# Patient Record
Sex: Male | Born: 1965
Health system: Southern US, Community
[De-identification: ages and names within clinical notes are randomized; demographics above are authoritative.]

## PROBLEM LIST (undated history)

## (undated) DIAGNOSIS — E785 Hyperlipidemia, unspecified: Secondary | ICD-10-CM

## (undated) DIAGNOSIS — R0789 Other chest pain: Secondary | ICD-10-CM

## (undated) DIAGNOSIS — I493 Ventricular premature depolarization: Secondary | ICD-10-CM

## (undated) DIAGNOSIS — IMO0001 Reserved for inherently not codable concepts without codable children: Secondary | ICD-10-CM

## (undated) DIAGNOSIS — M109 Gout, unspecified: Secondary | ICD-10-CM

## (undated) DIAGNOSIS — I509 Heart failure, unspecified: Secondary | ICD-10-CM

## (undated) DIAGNOSIS — R7301 Impaired fasting glucose: Secondary | ICD-10-CM

## (undated) DIAGNOSIS — I499 Cardiac arrhythmia, unspecified: Secondary | ICD-10-CM

## (undated) DIAGNOSIS — E669 Obesity, unspecified: Secondary | ICD-10-CM

## (undated) DIAGNOSIS — K219 Gastro-esophageal reflux disease without esophagitis: Secondary | ICD-10-CM

## (undated) DIAGNOSIS — I10 Essential (primary) hypertension: Secondary | ICD-10-CM

## (undated) DIAGNOSIS — Z9989 Dependence on other enabling machines and devices: Secondary | ICD-10-CM

## (undated) DIAGNOSIS — R42 Dizziness and giddiness: Secondary | ICD-10-CM

## (undated) DIAGNOSIS — G4733 Obstructive sleep apnea (adult) (pediatric): Secondary | ICD-10-CM

## (undated) HISTORY — DX: Ventricular premature depolarization: I49.3

## (undated) HISTORY — DX: Heart failure, unspecified: I50.9

## (undated) HISTORY — DX: Other chest pain: R07.89

## (undated) HISTORY — DX: Impaired fasting glucose: R73.01

## (undated) HISTORY — DX: Dizziness and giddiness: R42

## (undated) HISTORY — DX: Obstructive sleep apnea (adult) (pediatric): G47.33

## (undated) HISTORY — DX: Hyperlipidemia, unspecified: E78.5

## (undated) HISTORY — PX: CARPAL TUNNEL RELEASE: SHX101

## (undated) HISTORY — DX: Gastro-esophageal reflux disease without esophagitis: K21.9

## (undated) HISTORY — DX: Cardiac arrhythmia, unspecified: I49.9

## (undated) HISTORY — DX: Obesity, unspecified: E66.9

## (undated) HISTORY — DX: Essential (primary) hypertension: I10

## (undated) HISTORY — DX: Dependence on other enabling machines and devices: Z99.89

## (undated) HISTORY — DX: Reserved for inherently not codable concepts without codable children: IMO0001

## (undated) HISTORY — DX: Gout, unspecified: M10.9

---

## 2004-12-05 ENCOUNTER — Ambulatory Visit: Payer: Self-pay | Admitting: Cardiovascular Disease

## 2004-12-05 ENCOUNTER — Observation Stay (HOSPITAL_COMMUNITY): Admission: EM | Admit: 2004-12-05 | Discharge: 2004-12-07 | Payer: Self-pay | Admitting: Emergency Medicine

## 2004-12-08 ENCOUNTER — Ambulatory Visit: Payer: Self-pay

## 2004-12-25 ENCOUNTER — Ambulatory Visit: Payer: Self-pay | Admitting: Cardiology

## 2013-12-04 ENCOUNTER — Institutional Professional Consult (permissible substitution): Payer: Self-pay | Admitting: Internal Medicine

## 2015-05-27 ENCOUNTER — Ambulatory Visit (INDEPENDENT_AMBULATORY_CARE_PROVIDER_SITE_OTHER): Payer: BLUE CROSS/BLUE SHIELD | Admitting: Neurology

## 2015-05-27 ENCOUNTER — Encounter: Payer: Self-pay | Admitting: Neurology

## 2015-05-27 VITALS — BP 116/62 | HR 48 | Resp 22 | Ht 66.0 in | Wt 252.0 lb

## 2015-05-27 DIAGNOSIS — G4719 Other hypersomnia: Secondary | ICD-10-CM

## 2015-05-27 DIAGNOSIS — R351 Nocturia: Secondary | ICD-10-CM

## 2015-05-27 DIAGNOSIS — R002 Palpitations: Secondary | ICD-10-CM | POA: Diagnosis not present

## 2015-05-27 DIAGNOSIS — G4733 Obstructive sleep apnea (adult) (pediatric): Secondary | ICD-10-CM | POA: Diagnosis not present

## 2015-05-27 DIAGNOSIS — I493 Ventricular premature depolarization: Secondary | ICD-10-CM

## 2015-05-27 DIAGNOSIS — R0789 Other chest pain: Secondary | ICD-10-CM | POA: Diagnosis not present

## 2015-05-27 NOTE — Progress Notes (Signed)
Subjective:    Patient ID: Todd [name] is a 49 y.o. male.  HPI     Huston Foley, MD, PhD [facility] [address] [address] [place], [place] [address]  Dear Todd [name],   I saw your patient, Todd [name], upon your kind request in my neurologic clinic today for initial consultation of his sleep disorder, in particular, reevaluation of his prior diagnosis of OSA. The patient is unaccompanied today. As you know, [name] is a 49 year old right-handed gentleman with an underlying medical history of reflux disease, hyperlipidemia, gout, PVCs, dizziness, impaired fasting glucose, hypertension and obesity, as well as recent chest pain, who was previously diagnosed with obstructive sleep apnea. Prior test results are not available for my review. He reports a diagnosis of severe obstructive sleep apnea. He was using CPAP in the past but it was difficult for him to use it. He stopped using it a few years ago. He has gained weight. He has had some palpitations and chest pain. He had recent further testing in the form of echocardiogram and a nuclear stress test and has an appointment for follow-up this week with you to discuss test results as I understand. He works night shifts on Fridays, Saturdays and Sundays, 12 hours, from 7 PM to 7 AM. On Monday mornings he tries to nap around lunchtime and then go to bed at night. His nighttime bedtime is around 11 or 11:30 PM. He is a loud snorer and has apneic pauses while asleep and wakes up with a sense of gasping for air. He denies morning headaches but has nocturia once or twice on an average night. He denies restless leg symptoms or leg twitching at night. He is not aware of any family history of sleep apnea. He has some stress lately because of his parents health. His mother is 53 and is in end-stage lung cancer. His father is 93 years old and recently fell and broke his hip and had surgery today. His Epworth sleepiness score is 11  out of 24, fatigue score is 25 out of 63. He is a nonsmoker. He drinks caffeine occasionally but does not have to have it daily. He drinks approximately 1 beer a week.  His Past Medical History Is Significant For: Past Medical History  Diagnosis Date  . GERD (gastroesophageal reflux disease)   . HLD (hyperlipidemia)   . OSA on CPAP   . Gout   . PVC (premature ventricular contraction)   . Dizziness   . Atypical chest pain   . IFG (impaired fasting glucose)   . Obesity   . Hypertension     His Past Surgical History Is Significant For: Past Surgical History  Procedure Laterality Date  . Carpal tunnel release      His Family History Is Significant For: Family History  Problem Relation Age of Onset  . Seizures Mother   . Deep vein thrombosis Mother   . COPD Mother   . Heart failure Mother   . Lung cancer Mother   . Prostate cancer Father   . Hypertension Father   . Diabetes Father     His Social History Is Significant For: History   Social History  . Marital Status: Married    Spouse Name: Dorisann Frames  . Number of Children: 1  . Years of Education: HS   Occupational History  . [employer]    Social History Main Topics  . Smoking status: Never Smoker   . Smokeless tobacco: Not on file  .  Alcohol Use: 0.0 oz/week    0 Standard drinks or equivalent per week     Comment: Rare occasion  . Drug Use: No  . Sexual Activity: Not on file   Other Topics Concern  . None   Social History Narrative   Drinks 1 Mt Dew 2 days a week     His Allergies Are:  No Known Allergies:   His Current Medications Are:  Outpatient Encounter Prescriptions as of 05/27/2015  Medication Sig  . amLODipine-olmesartan (AZOR) 5-40 MG per tablet Take 1 tablet by mouth daily.  [name] albuterol (PROVENTIL HFA;VENTOLIN HFA) 108 (90 BASE) MCG/ACT inhaler Inhale into the lungs every 6 (six) hours as needed for wheezing or shortness of breath.  . [DISCONTINUED] pantoprazole (PROTONIX) 40 MG tablet Take 40 mg by  mouth daily.   No facility-administered encounter medications on file as of 05/27/2015.  :  Review of Systems:  Out of a complete 14 point review of systems, all are reviewed and negative with the exception of these symptoms as listed below:   Review of Systems  Neurological:       H/O CPAP use, has not used for about 8 years. Had trouble using machine, and staying on face. Snoring, witnessed apnea, wakes up in night short of breath and panicked. No trouble falling or staying asleep. Wakes up feeling rested. Daytime tiredness.     Objective:  Neurologic Exam  Physical Exam Physical Examination:   Filed Vitals:   05/27/15 1452  BP: 116/62  Pulse: 48  Resp: 22   General Examination: The patient is a very pleasant 49 y.o. male in no acute distress. He appears well-developed and well-nourished and well groomed.   HEENT: Normocephalic, atraumatic, pupils are equal, round and reactive to light and accommodation. Funduscopic exam is normal with sharp disc margins noted. Extraocular tracking is good without limitation to gaze excursion or nystagmus noted. Normal smooth pursuit is noted. Hearing is grossly intact. Tympanic membranes are clear bilaterally. Face is symmetric with normal facial animation and normal facial sensation. Speech is clear with no dysarthria noted. There is no hypophonia. There is no lip, neck/head, jaw or voice tremor. Neck is supple with full range of passive and active motion. There are no carotid bruits on auscultation. Oropharynx exam reveals: mild mouth dryness, adequate dental hygiene and marked airway crowding, due to largue tongue, larger tonsils of 3+ b/l, larger uvula. Mallampati is class III. Tongue protrudes centrally and palate elevates symmetrically. Neck size is 18.75 inches. He has a Absent overbite. Nasal inspection reveals no significant nasal mucosal bogginess or redness and no septal deviation.   Chest: Clear to auscultation without wheezing, rhonchi or  crackles noted.  Heart: S1+S2+0, regular and normal without murmurs, rubs or gallops noted.   Abdomen: Soft, non-tender and non-distended with normal bowel sounds appreciated on auscultation.  Extremities: There is no pitting edema in the distal lower extremities bilaterally. Pedal pulses are intact.  Skin: Warm and dry without trophic changes noted. There are no varicose veins.  Musculoskeletal: exam reveals no obvious joint deformities, tenderness or joint swelling or erythema.   Neurologically:  Mental status: The patient is awake, alert and oriented in all 4 spheres. His immediate and remote memory, attention, language skills and fund of knowledge are appropriate. There is no evidence of aphasia, agnosia, apraxia or anomia. Speech is clear with normal prosody and enunciation. Thought process is linear. Mood is normal and affect is normal.  Cranial nerves II - XII are as  described above under HEENT exam. In addition: shoulder shrug is normal with equal shoulder height noted. Motor exam: Normal bulk, strength and tone is noted. There is no drift, tremor or rebound. Romberg is negative. Reflexes are 2+ throughout. Babinski: Toes are flexor bilaterally. Fine motor skills and coordination: intact with normal finger taps, normal hand movements, normal rapid alternating patting, normal foot taps and normal foot agility.  Cerebellar testing: No dysmetria or intention tremor on finger to nose testing. Heel to shin is unremarkable bilaterally. There is no truncal or gait ataxia.  Sensory exam: intact to light touch, pinprick, vibration, temperature sense in the upper and lower extremities.  Gait, station and balance: He stands easily. No veering to one side is noted. No leaning to one side is noted. Posture is age-appropriate and stance is narrow based. Gait shows normal stride length and normal pace. No problems turning are noted. He turns en bloc. Tandem walk is unremarkable.  Assessment and Plan:   In summary, Kysean Witmer is a very pleasant 49 y.o.-year old male with an underlying medical history of reflux disease, hyperlipidemia, gout, PVCs, dizziness, impaired fasting glucose, hypertension and obesity, as well as recent chest pain, with a prior diagnosis of severe obstructive sleep apnea. He recalls that he quit breathing over 70 times per hour in the past sleep study which was about 10 years ago. He has in the interim gained weight. He reports witnessed apneic pauses, snoring, and excessive daytime somnolence. His history and physical exam concerning are in keeping with significant obstructive sleep apnea (OSA). I had a long chat with the patient about my findings and the diagnosis of OSA, its prognosis and treatment options. We talked about medical treatments, surgical interventions and non-pharmacological approaches. I explained in particular the risks and ramifications of untreated moderate to severe OSA, especially with respect to developing cardiovascular disease down the Road, including congestive heart failure, difficult to treat hypertension, cardiac arrhythmias, or stroke. Even type 2 diabetes has, in part, been linked to untreated OSA. Symptoms of untreated OSA include daytime sleepiness, memory problems, mood irritability and mood disorder such as depression and anxiety, lack of energy, as well as recurrent headaches, especially morning headaches. We talked about trying to maintain a healthy lifestyle in general, as well as the importance of weight control. I encouraged the patient to eat healthy, exercise daily and keep well hydrated, to keep a scheduled bedtime and wake time routine, to not skip any meals and eat healthy snacks in between meals. I advised the patient not to drive when feeling sleepy. I recommended the following at this time: sleep study with potential positive airway pressure titration. (We will score hypopneas at 3% and split the sleep study into diagnostic and  treatment portion, if the estimated. 2 hour AHI is >15/h).   I explained the sleep test procedure to the patient and also outlined possible surgical and non-surgical treatment options of OSA, including the use of a custom-made dental device (which would require a referral to a specialist dentist or oral surgeon), upper airway surgical options, such as pillar implants, radiofrequency surgery, tongue base surgery, and UPPP (which would involve a referral to an ENT surgeon). Rarely, jaw surgery such as mandibular advancement may be considered.  I also explained the CPAP treatment option to the patient, who indicated that he would be willing to try CPAP again, if the need arises. I explained the importance of being compliant with PAP treatment, not only for insurance purposes but primarily to improve  His symptoms, and for the patient's long term health benefit, including to reduce His cardiovascular risks. I answered all his questions today and the patient was in agreement. I would like to see him back after the sleep study is completed and encouraged him to call with any interim questions, concerns, problems or updates.   Thank you very much for allowing me to participate in the care of this nice patient. If I can be of any further assistance to you please do not hesitate to call me at [phone].  Sincerely,   Star Age, MD, PhD

## 2015-05-27 NOTE — Patient Instructions (Signed)

## 2015-06-24 ENCOUNTER — Ambulatory Visit (INDEPENDENT_AMBULATORY_CARE_PROVIDER_SITE_OTHER): Payer: BLUE CROSS/BLUE SHIELD | Admitting: Neurology

## 2015-06-24 DIAGNOSIS — G4733 Obstructive sleep apnea (adult) (pediatric): Secondary | ICD-10-CM | POA: Diagnosis not present

## 2015-06-24 DIAGNOSIS — R9431 Abnormal electrocardiogram [ECG] [EKG]: Secondary | ICD-10-CM

## 2015-06-24 DIAGNOSIS — G479 Sleep disorder, unspecified: Secondary | ICD-10-CM

## 2015-06-24 DIAGNOSIS — G4734 Idiopathic sleep related nonobstructive alveolar hypoventilation: Secondary | ICD-10-CM

## 2015-06-24 NOTE — Sleep Study (Signed)
Please see the scanned sleep study interpretation located in the Procedure tab within the Chart Review section. 

## 2015-06-25 ENCOUNTER — Telehealth: Payer: Self-pay | Admitting: Neurology

## 2015-06-25 DIAGNOSIS — G4734 Idiopathic sleep related nonobstructive alveolar hypoventilation: Secondary | ICD-10-CM

## 2015-06-25 DIAGNOSIS — G4733 Obstructive sleep apnea (adult) (pediatric): Secondary | ICD-10-CM

## 2015-06-25 DIAGNOSIS — R9431 Abnormal electrocardiogram [ECG] [EKG]: Secondary | ICD-10-CM

## 2015-06-25 NOTE — Telephone Encounter (Signed)
Patient seen on 05/27/15, PSG on 06/24/15. Please call and notify the patient that the recent sleep study did confirm the diagnosis of SEVERE obstructive sleep apnea and that I recommend treatment for this in the form of CPAP. This will require a repeat sleep study for proper titration and mask fitting and I would like to do this ASAP. We were not able to start CPAP the same night, because he just did not sleep enough or well enough. The night tech requested that he consider trim his facial hair, as we would have a higher chance of having a good seal with the CPAP mask. Please explain to patient and arrange for a CPAP titration study. I have placed an order in the chart. Please route ph note to [facility], and route PSG report to Dr. [name] and patient's PCP (?). Thanks,  Huston Foley, MD, PhD [facility])

## 2015-06-25 NOTE — Telephone Encounter (Signed)
I spoke to patient, he is aware of results and would like to proceed. I will fax order to Dr. [name] and PCP.

## 2015-06-30 DIAGNOSIS — R079 Chest pain, unspecified: Secondary | ICD-10-CM

## 2015-06-30 NOTE — H&P (Addendum)
OFFICE VISIT NOTES COPIED TO EPIC FOR DOCUMENTATION  Todd [name]. Todd [name] 05/29/2015 8:06 AM Location: [facility] Patient #: [phone] DOB: [birth year] Married / Language: Todd [name] / Race: White Male  History of Present Illness Todd Pert MD; 05/29/2015 6:34 PM) The patient is a 49 year old male who presents with chest pain. Patient is a 49 year old Caucasian male who was referred to Todd [name] for evaluation of atypical chest pain in the form of running sensation in the chest, aching in the chest, which used to be relieved with burping and eating small meals, however has had exertional chest discomfort, history is very vague.  He states that yesterday he had tried PPI with improvement in symptoms. This has not significantly changed his symptoms that started recently were the past month or 2. He has had occasional episodes of dizziness, but denies any shortness of breath, ENT or orthopnea.  On his last office visit, after he had an echocardiogram, which she was found to have severe LV systolic dysfunction, I had started him on carvedilol and discontinued Exforge due to amlodipine combination. Switched him to have Benicar instead. Patient developed severe fatigue, dizziness as well as he started the new regimen and hence discontinued her medications and is back on Exforge. He now presents here to discuss stress testing and echocardiogram findings. No new symptomatology. Again continues to have vague chest discomfort, but still continues to do routine activities without exercise on a regular fashion. His echocardiogram was on 04/29/2015 revealing ejection fraction of 30% without significant valvular maladies and the nuclear stress test on 05/12/2015 revealing inferior wall scar mild peri-infarct ischemia and severe anteroapical ischemia at the apex with a LVEF 19%. No new symptomatology since last office visit.  Problem List/Past Medical (Todd [name]; 05/29/2015 6:33 PM) Chest pain,  atypical (R07.89) Exercise sestamibi stress test 05/12/2015: 1. Resting EKG demonstrated normal sinus rhythm, leftward axis, incomplete right bundle branch block, poor R-wave progression. Stress EKG was negative for myocardial ischemia. Patient exercised for 7 minutes and 27 seconds and achieved 8.7 METS. Stress test was terminated due to shortness of breath, target heart rate (85%) achieved. There are frequent PVCs, ventricular couplets and brief ventricular bigeminy in the recovery phase of the stress EKG. Normal blood pressure response. 2. The left is maarkedly dilated in both in rest and stress images. The perfusion imaging study demonstrates inferior wall, inferolateral nontransmural myocardial infarction with very mild peri-infarct ischemia from base towards the mid ventricle. In addition there is small sized moderate to severe ischemia in the anteroseptal region extending distally to the apex. The left ventricular systolic function Calculated by QGS was 19% with diffuse global hypokinesis, inferior akinesis. Overall this represents high risk study, multivessel disease not be excluded. Consider further cardiac workup. Dizziness (R42) PVC (premature ventricular contraction) (I49.3) Benign essential hypertension (I10) Labs 02/06/2015: Serum glucose 117, creatinine 1.0, ALT 50, CMP otherwise normal, CBC normal, TSH 1.66 Obesity, morbid, BMI 40.0-49.9 (E66.01) Gastroesophageal reflux disease, esophagitis presence not specified (K21.9) Hyperlipidemia, group A (E78.0) Labs 09/02/2014: Creatinine 0.9, CMP normal, CBC normal, total cholesterol 176, triglycerides 105, HDL 41, LDL 114, HbA1c 5.5% Obstructive sleep apnea, adult (G47.33) intolerant of c-pap Office visit 05/27/2015 Todd Furbish, MD): Scheduled for sleep study with potential positive airway pressure titration. Cardiomyopathy (I42.9) Echo- 04/29/2015 1. Left ventricle cavity is moderately dilated. Dilated cardiomyopathy. Moderate concentric  hypertrophy of the left ventricle. Severe decrease in global wall motion. Doppler evidence of grade I (impaired) diastolic dysfunction. Severely decreased systolic function. Calculated EF  30%. 2. Trace aortic regurgitation. 3. Mild mitral regurgitation. 4. Trace tricuspid regurgitation Gout (M10.9) IFG (impaired fasting glucose) (R73.01)  Allergies (Todd Garrison; 05/29/2015 3:15 PM) No Known Drug Allergies04/10/2015  Family History (Todd Garrison; 05/29/2015 3:15 PM) Mother In stable health. hx of seizures, , DVT, Copd; CHF,no heart attack or strokes, no other cardiovascular conditions Father In stable health. hx of prostate cancer, htn, dm; no heart attacks or strokes; no other cardiovascular conditions  Social History (Todd [name]; 05/29/2015 3:15 PM) Current tobacco use Never smoker. Alcohol Use Occasional alcohol use. Marital status Married. Living Situation Lives with spouse. Number of Children 1.  Past Surgical History (Todd [name]; 05/29/2015 3:15 PM) Carpal Tunnel [phone]  Medication History (Todd [name]; 05/29/2015 3:28 PM) Benicar (40MG  Tablet, 1 (one) Tablet Oral daily, Taken starting 04/29/2015) Discontinued: Patient Choice. (Discontinue Azor) Carvedilol (6.25MG  Tablet, 1 (one) Tablet Oral two times daily, Taken starting 04/29/2015) Discontinued: Patient Choice. Pantoprazole Sodium (40MG  Tablet DR, 1 Oral daily) Active. ProAir HFA (108 (90 Base)MCG/ACT Aerosol Soln, 1 puff Inhalation daily as needed) Active. Advil (200MG  Tablet, 1 to 2 Oral as needed for headache) Active. Azor (5-40MG  Tablet, 1 Oral daily, Taken starting 05/25/2015) Active: LV dysfunction. Medications Reconciled  Diagnostic Studies History Todd Pert, MD; 05/29/2015 6:21 PM) Echocardiogram05/31/2016 1. Left ventricle cavity is moderately dilated. Dilated cardiomyopathy. Moderate concentric hypertrophy of the left ventricle. Severe decrease in global wall motion. Doppler  evidence of grade I (impaired) diastolic dysfunction. Severely decreased systolic function. Calculated EF 30%. 2. Trace aortic regurgitation. 3. Mild mitral regurgitation. 4. Trace tricuspid regurgitation Nuclear stress test06/13/2016 1. Resting EKG demonstrated normal sinus rhythm, leftward axis, incomplete right bundle branch block, poor R-wave progression. Stress EKG was negative for myocardial ischemia. Patient exercised for 7 minutes and 27 seconds and achieved 8.7 METS. Stress test was terminated due to shortness of breath, target heart rate (85%) achieved. There are frequent PVCs, ventricular couplets and brief ventricular bigeminy in the recovery phase of the stress EKG. Normal blood pressure response. 2. The left is maarkedly dilated in both in rest and stress images. The perfusion imaging study demonstrates inferior wall, inferolateral nontransmural myocardial infarction with very mild peri-infarct ischemia from base towards the mid ventricle. In addition there is small sized moderate to severe ischemia in the anteroseptal region extending distally to the apex. The left ventricular systolic function Calculated by QGS was 19% with diffuse global hypokinesis, inferior akinesis. Overall this represents high risk study, multivessel disease not be excluded. Consider further cardiac workup. Lower Extremity Dopplers2002 Endoscopy2006 Sleep [id number] Treadmill stress test01/2010 11/2004   Review of Systems Todd Pert, MD; 05/29/2015 6:34 PM) General Not Present- Anorexia, Fatigue and Fever. Respiratory Not Present- Cough, Decreased Exercise Tolerance and Dyspnea. Cardiovascular Present- Chest Pain. Not Present- Claudications, Edema, Orthopnea, Paroxysmal Nocturnal Dyspnea and Shortness of Breath. Gastrointestinal Present- Belching and Indigestion. Not Present- Change in Bowel Habits, Constipation and Nausea. Neurological Present- Dizziness. Not Present- Focal Neurological Symptoms and  Syncope. Endocrine Not Present- Appetite Changes, Cold Intolerance and Heat Intolerance. Hematology Not Present- Anemia, Petechiae and Prolonged Bleeding. Vitals (Todd Garrison; 05/29/2015 3:31 PM) 05/29/2015 3:17 PM Weight: 250 lb Height: 66in Body Surface Area: 2.2 m Body Mass Index: 40.35 kg/m  Pulse: 37 (Regular)  P.OX: 95% (Room air) BP: 128/68 (Sitting, Left Arm, Standard)     Physical Exam Todd Pert, MD; 05/29/2015 6:34 PM) General Mental Status-Alert. General Appearance-Cooperative, Appears stated age, Not in acute distress. Orientation-Oriented X3. Build & Nutrition-Well built and Morbidly obese.  Head and Neck Thyroid Gland Characteristics - no palpable nodules, no palpable enlargement.  Chest and Lung Exam Palpation Tender - No chest wall tenderness. Auscultation Breath sounds - Clear.  Cardiovascular Inspection Jugular vein - Right - No Distention. Auscultation Heart Sounds - S1 WNL, S2 WNL and No gallop present. Murmurs & Other Heart Sounds - Murmur - No murmur.  Abdomen Inspection Contour - Obese. Palpation/Percussion Normal exam - Non Tender and No hepatosplenomegaly. Auscultation Normal exam - Bowel sounds normal.  Peripheral Vascular Lower Extremity Inspection - Left - No Pigmentation, No Varicose veins. Right - No Pigmentation, No Varicose veins. Palpation - Edema - Left - No edema. Right - No edema. Femoral pulse - Left - Normal. Right - Normal. Popliteal pulse - Left - Normal. Right - Normal. Dorsalis pedis pulse - Left - Normal. Right - Normal. Posterior tibial pulse - Left - Normal. Right - Normal. Carotid arteries - Left-No Carotid bruit. Carotid arteries - Right-No Carotid bruit. Abdomen-No prominent abdominal aortic pulsation, No epigastric bruit.  Neurologic Motor-Grossly intact without any focal deficits.  Musculoskeletal Global Assessment Left Lower Extremity - normal range of motion without pain.  Right Lower Extremity - normal range of motion without pain.  Assessment & Plan Todd Pert MD; 05/29/2015 6:21 PM) Chest pain, atypical (R07.89) Story: Exercise sestamibi stress test 05/12/2015: 1. Resting EKG demonstrated normal sinus rhythm, leftward axis, incomplete right bundle branch block, poor R-wave progression. Stress EKG was negative for myocardial ischemia. Patient exercised for 7 minutes and 27 seconds and achieved 8.7 METS. Stress test was terminated due to shortness of breath, target heart rate (85%) achieved. There are frequent PVCs, ventricular couplets and brief ventricular bigeminy in the recovery phase of the stress EKG. Normal blood pressure response. 2. The left ventricle is  markedly dilated in both in rest and stress images. The perfusion imaging study demonstrates inferior wall, inferolateral nontransmural myocardial infarction with very mild peri-infarct ischemia from base towards the mid ventricle. In addition there is small sized moderate to severe ischemia in the anteroseptal region extending distally to the apex. The left ventricular systolic function Calculated by QGS was 19% with diffuse global hypokinesis, inferior akinesis. Overall this represents high study, multivessel disease not be excluded. Consider further cardiac workup. Impression: EKG 05/29/2015: Normal sinus rhythm at rate of 80 bpm, normal axis, incomplete right bundle branch block. Frequent PVCs. Nonspecific T abnormality. Compared to EKG 04/09/2015 PVC new. Current Plans Complete electrocardiogram (93000) Started Nitrostat 0.4MG , 1 (one) Tab Sublingual every 5 minutes as needed for chest pain., #25, 05/29/2015, No Refill. METABOLIC PANEL, BASIC (16109) CBC & PLATELETS (AUTO) (60454) PT (PROTHROMBIN TIME) (09811) Cardiomyopathy (I42.9) Story: Echo- 04/29/2015 1. Left ventricle cavity is moderately dilated. Dilated cardiomyopathy. Moderate concentric hypertrophy of the left ventricle. Severe decrease  in global wall motion. Doppler evidence of grade I (impaired) diastolic dysfunction. Severely decreased systolic function. Calculated EF 30%. 2. Trace aortic regurgitation. 3. Mild mitral regurgitation. 4. Trace tricuspid regurgitation Current Plans Discontinued Azor 5-40MG  (LV dysfunction). Changed Benicar 40MG ,  (one half) Tablet daily, #30, 05/29/2015, Ref. x1. Started Metoprolol Tartrate 25MG ,  Tablet two times daily, #60, 05/29/2015, Ref. x1. Local Order: Discontinue Coreg Started Atorvastatin Calcium 10MG , 1 (one) Tablet daily with food, #30, 05/29/2015, Ref. x1. PVC (premature ventricular contraction) (I49.3) Benign essential hypertension (I10) Story: Labs 02/06/2015: Serum glucose 117, creatinine 1.0, ALT 50, CMP otherwise normal, CBC normal, TSH 1.66  Lipid profile 04/09/2015: Total cholesterol 175, triglycerides 105, HDL 41, LDL 114. BMP was normal,  CMP normal, CBC normal. Obesity, morbid, BMI 40.0-49.9 (E66.01) Obstructive sleep apnea, adult (M01.02) Story: intolerant of c-pap  Office visit 05/27/2015 Todd Furbish, MD): Scheduled for sleep study with potential positive airway pressure titration. Current Plans Mechanism of underlying disease process and action of medications discussed with the patient. I discussed primary/secondary prevention and also dietary counseling was done. Patient was Todd [name] to me for follow-up and evaluation of cardiomyopathy, frequent PVCs, underwent echocardiogram and also stress testing.  He has markedly abnormal stress test, high risk for multivessel disease. Patient could not tolerate high doses of Coreg, he felt markedly fatigued and weak. I discontinued Coreg and started him on metoprolol 25 mg one half tablet by mouth twice a day. Discontinued is or due to LV dysfunction, changed him back to Benicar 40 mg but advised him to start taking half tablet daily. His lipids are very minimally abnormal, however due to high suspicion for CAD, after long discussion, agent  agrees to take atorvastatin 10 mg by mouth daily. S/L NTG was prescribed and explained how to and when to use it and to notify Todd [name] if there is change in frequency of use. Interaction with cialis-like agents (if applicable was discussed). Patient instructed not to do heavy lifting, heavy exertional activity, swimming until evaluation is complete. Patient instructed to call if symptoms worse or to go to the ED for further evaluation.  Schedule for cardiac catheterization, and possible angioplasty. We discussed regarding risks, benefits, alternatives to this including stress testing, CTA and continued medical therapy. Patient wants to proceed. Understands <1-2% risk of death, stroke, MI, urgent CABG, bleeding, infection, renal failure but not limited to these. Video recording of the procedure shown to the patient. Office visit after the tests.  Addendum Note(Bridgette Allison AGNP-C; 06/26/2015 4:54 PM) Labs 06/26/2015: Serum glucose 133, creatinine 1.01, potassium 4.0, BMP normal, CBC normal, PT 10.3, INR 1.0     Signed by Todd Pert, MD (05/29/2015 6:35 PM)

## 2015-07-01 ENCOUNTER — Encounter (HOSPITAL_COMMUNITY)
Admission: RE | Disposition: A | Discharge status: Home / Self Care | Payer: Self-pay | Source: Ambulatory Visit | Attending: Cardiology

## 2015-07-01 ENCOUNTER — Ambulatory Visit (HOSPITAL_COMMUNITY)
Admission: RE | Admit: 2015-07-01 | Discharge: 2015-07-01 | Disposition: A | Discharge status: Home / Self Care | Payer: BLUE CROSS/BLUE SHIELD | Source: Ambulatory Visit | Attending: Cardiology | Admitting: Cardiology

## 2015-07-01 ENCOUNTER — Encounter (HOSPITAL_COMMUNITY): Payer: Self-pay | Admitting: Cardiology

## 2015-07-01 DIAGNOSIS — I10 Essential (primary) hypertension: Secondary | ICD-10-CM | POA: Insufficient documentation

## 2015-07-01 DIAGNOSIS — Z6841 Body Mass Index (BMI) 40.0 and over, adult: Secondary | ICD-10-CM | POA: Insufficient documentation

## 2015-07-01 DIAGNOSIS — I493 Ventricular premature depolarization: Secondary | ICD-10-CM | POA: Insufficient documentation

## 2015-07-01 DIAGNOSIS — R0789 Other chest pain: Secondary | ICD-10-CM | POA: Insufficient documentation

## 2015-07-01 DIAGNOSIS — E785 Hyperlipidemia, unspecified: Secondary | ICD-10-CM | POA: Insufficient documentation

## 2015-07-01 DIAGNOSIS — I420 Dilated cardiomyopathy: Secondary | ICD-10-CM | POA: Diagnosis present

## 2015-07-01 DIAGNOSIS — R079 Chest pain, unspecified: Secondary | ICD-10-CM

## 2015-07-01 DIAGNOSIS — M109 Gout, unspecified: Secondary | ICD-10-CM | POA: Diagnosis not present

## 2015-07-01 DIAGNOSIS — I340 Nonrheumatic mitral (valve) insufficiency: Secondary | ICD-10-CM | POA: Insufficient documentation

## 2015-07-01 DIAGNOSIS — I4510 Unspecified right bundle-branch block: Secondary | ICD-10-CM | POA: Insufficient documentation

## 2015-07-01 DIAGNOSIS — G4733 Obstructive sleep apnea (adult) (pediatric): Secondary | ICD-10-CM | POA: Diagnosis not present

## 2015-07-01 DIAGNOSIS — R7301 Impaired fasting glucose: Secondary | ICD-10-CM | POA: Diagnosis not present

## 2015-07-01 DIAGNOSIS — K219 Gastro-esophageal reflux disease without esophagitis: Secondary | ICD-10-CM | POA: Diagnosis not present

## 2015-07-01 HISTORY — PX: CARDIAC CATHETERIZATION: SHX172

## 2015-07-01 SURGERY — LEFT HEART CATH
Anesthesia: LOCAL

## 2015-07-01 MED ORDER — SODIUM CHLORIDE 0.9 % WEIGHT BASED INFUSION
1.0000 mL/kg/h | INTRAVENOUS | Status: DC
Start: 2015-07-01 — End: 2015-07-01

## 2015-07-01 MED ORDER — SODIUM CHLORIDE 0.9 % IV SOLN
INTRAVENOUS | Status: DC
  Administered 2015-07-01: 11:00:00 via INTRAVENOUS

## 2015-07-01 MED ORDER — SODIUM CHLORIDE 0.9 % IJ SOLN: 3.0000 mL | Freq: Two times a day (BID) | INTRAMUSCULAR | Status: DC

## 2015-07-01 MED ORDER — SODIUM CHLORIDE 0.9 % IJ SOLN: 3.0000 mL | INTRAMUSCULAR | Status: DC | PRN

## 2015-07-01 MED ORDER — MIDAZOLAM HCL 2 MG/2ML IJ SOLN
INTRAMUSCULAR | Status: AC
  Filled 2015-07-01: qty 4

## 2015-07-01 MED ORDER — LIDOCAINE HCL (PF) 1 % IJ SOLN
INTRAMUSCULAR | Status: AC
  Filled 2015-07-01: qty 30

## 2015-07-01 MED ORDER — IOHEXOL 350 MG/ML SOLN
INTRAVENOUS | Status: DC | PRN
  Administered 2015-07-01: 60 mL via INTRA_ARTERIAL

## 2015-07-01 MED ORDER — NITROGLYCERIN 1 MG/10 ML FOR IR/CATH LAB
INTRA_ARTERIAL | Status: AC
  Filled 2015-07-01: qty 10

## 2015-07-01 MED ORDER — SODIUM CHLORIDE 0.9 % IV SOLN: 250.0000 mL | INTRAVENOUS | Status: DC | PRN

## 2015-07-01 MED ORDER — ASPIRIN 81 MG PO CHEW
CHEWABLE_TABLET | ORAL | Status: DC
Start: 2015-07-01 — End: 2015-07-01
  Filled 2015-07-01: qty 1

## 2015-07-01 MED ORDER — HYDROMORPHONE HCL 1 MG/ML IJ SOLN
INTRAMUSCULAR | Status: DC | PRN
  Administered 2015-07-01: 0.5 mg via INTRAVENOUS

## 2015-07-01 MED ORDER — VERAPAMIL HCL 2.5 MG/ML IV SOLN
INTRAVENOUS | Status: AC
  Filled 2015-07-01: qty 2

## 2015-07-01 MED ORDER — ASPIRIN 81 MG PO CHEW
81.0000 mg | CHEWABLE_TABLET | ORAL | Status: AC
  Administered 2015-07-01: 81 mg via ORAL

## 2015-07-01 MED ORDER — RADIAL COCKTAIL (HEPARIN/VERAPAMIL/LIDOCAINE/NITRO)
Status: DC | PRN
  Administered 2015-07-01: 1 via INTRA_ARTERIAL

## 2015-07-01 MED ORDER — HEPARIN (PORCINE) IN NACL 2-0.9 UNIT/ML-% IJ SOLN
INTRAMUSCULAR | Status: AC
  Filled 2015-07-01: qty 1500

## 2015-07-01 MED ORDER — MIDAZOLAM HCL 2 MG/2ML IJ SOLN
INTRAMUSCULAR | Status: DC | PRN
  Administered 2015-07-01: 2 mg via INTRAVENOUS

## 2015-07-01 MED ORDER — HYDROMORPHONE HCL 1 MG/ML IJ SOLN
INTRAMUSCULAR | Status: AC
  Filled 2015-07-01: qty 1

## 2015-07-01 MED ORDER — HEPARIN SODIUM (PORCINE) 1000 UNIT/ML IJ SOLN
INTRAMUSCULAR | Status: DC | PRN
  Administered 2015-07-01: 6000 [IU] via INTRAVENOUS

## 2015-07-01 MED ORDER — SODIUM CHLORIDE 0.9 % WEIGHT BASED INFUSION
3.0000 mL/kg/h | INTRAVENOUS | Status: AC
  Administered 2015-07-01: 3 mL/kg/h via INTRAVENOUS

## 2015-07-01 SURGICAL SUPPLY — 11 items
CATH INFINITI 5FR MPB2 (CATHETERS) IMPLANT
CATH OPTITORQUE TIG 4.0 5F (CATHETERS) ×2 IMPLANT
DEVICE RAD COMP TR BAND LRG (VASCULAR PRODUCTS) ×2 IMPLANT
GLIDESHEATH SLEND A-KIT 6F 20G (SHEATH) ×2 IMPLANT
KIT HEART LEFT (KITS) ×2 IMPLANT
PACK CARDIAC CATHETERIZATION (CUSTOM PROCEDURE TRAY) ×2 IMPLANT
SHEATH PINNACLE 5F 10CM (SHEATH) IMPLANT
TRANSDUCER W/STOPCOCK (MISCELLANEOUS) ×2 IMPLANT
TUBING CIL FLEX 10 FLL-RA (TUBING) ×2 IMPLANT
WIRE EMERALD 3MM-J .035X150CM (WIRE) IMPLANT
WIRE SAFE-T 1.5MM-J .035X260CM (WIRE) ×2 IMPLANT

## 2015-07-01 NOTE — Discharge Instructions (Signed)
Radial Site Care °Refer to this sheet in the next few weeks. These instructions provide you with information on caring for yourself after your procedure. Your caregiver may also give you more specific instructions. Your treatment has been planned according to current medical practices, but problems sometimes occur. Call your caregiver if you have any problems or questions after your procedure. °HOME CARE INSTRUCTIONS °· You may shower the day after the procedure. Remove the bandage (dressing) and gently wash the site with plain soap and water. Gently pat the site dry. °· Do not apply powder or lotion to the site. °· Do not submerge the affected site in water for 3 to 5 days. °· Inspect the site at least twice daily. °· Do not flex or bend the affected arm for 24 hours. °· No lifting over 5 pounds (2.3 kg) for 5 days after your procedure. °· Do not drive home if you are discharged the same day of the procedure. Have someone else drive you. °· You may drive 24 hours after the procedure unless otherwise instructed by your caregiver. °· Do not operate machinery or power tools for 24 hours. °· A responsible adult should be with you for the first 24 hours after you arrive home. °What to expect: °· Any bruising will usually fade within 1 to 2 weeks. °· Blood that collects in the tissue (hematoma) may be painful to the touch. It should usually decrease in size and tenderness within 1 to 2 weeks. °SEEK IMMEDIATE MEDICAL CARE IF: °· You have unusual pain at the radial site. °· You have redness, warmth, swelling, or pain at the radial site. °· You have drainage (other than a small amount of blood on the dressing). °· You have chills. °· You have a fever or persistent symptoms for more than 72 hours. °· You have a fever and your symptoms suddenly get worse. °· Your arm becomes pale, cool, tingly, or numb. °· You have heavy bleeding from the site. Hold pressure on the site. °Document Released: 12/18/2010 Document Revised:  02/07/2012 Document Reviewed: 12/18/2010 °Exit[employer]® Patient Information ©2015 [employer]. This information is not intended to replace advice given to you by your health care provider. Make sure you discuss any questions you have with your health care provider. ° °

## 2015-07-01 NOTE — Interval H&P Note (Signed)
History and Physical Interval Note:  07/01/2015 9:42 AM  Todd [name]  has presented today for surgery, with the diagnosis of c/p  The various methods of treatment have been discussed with the patient and family. After consideration of risks, benefits and other options for treatment, the patient has consented to  Procedure(s): Left Heart Cath (N/A) and possible PCI as a surgical intervention .  The patient's history has been reviewed, patient examined, no change in status, stable for surgery.  I have reviewed the patient's chart and labs.  Questions were answered to the patient's satisfaction.    Ischemic Symptoms? CCS II (Slight limitation of ordinary activity) Anti-ischemic Medical Therapy? Minimal Therapy (1 class of medications) Non-invasive Test Results? High-risk stress test findings: cardiac mortality >3%/yr Prior CABG? No Previous CABG   Patient Information:   1-2V CAD, no prox LAD  A (7)  Indication: 18; Score: 7   Patient Information:   CTO of 1 vessel, no other CAD  U (5)  Indication: 28; Score: 5   Patient Information:   1V CAD with prox LAD  A (8)  Indication: 34; Score: 8   Patient Information:   2V-CAD with prox LAD  A (8)  Indication: 40; Score: 8   Patient Information:   3V-CAD without LMCA  A (8)  Indication: 46; Score: 8   Patient Information:   3V-CAD without LMCA With Abnormal LV systolic function  A (9)  Indication: 48; Score: 9   Patient Information:   LMCA-CAD  A (9)  Indication: 49; Score: 9   Patient Information:   2V-CAD with prox LAD PCI  A (7)  Indication: 62; Score: 7   Patient Information:   2V-CAD with prox LAD CABG  A (8)  Indication: 62; Score: 8   Patient Information:   3V-CAD without LMCA With Low CAD burden(i.e., 3 focal stenoses, low SYNTAX score) PCI  A (7)  Indication: 63; Score: 7   Patient Information:   3V-CAD without LMCA With Low CAD burden(i.e., 3 focal stenoses, low SYNTAX  score) CABG  A (9)  Indication: 63; Score: 9   Patient Information:   3V-CAD without LMCA E06c - Intermediate-high CAD burden (i.e., multiple diffuse lesions, presence of CTO, or high SYNTAX score) PCI  U (4)  Indication: 64; Score: 4   Patient Information:   3V-CAD without LMCA E06c - Intermediate-high CAD burden (i.e., multiple diffuse lesions, presence of CTO, or high SYNTAX score) CABG  A (9)  Indication: 64; Score: 9   Patient Information:   LMCA-CAD With Isolated LMCA stenosis  PCI  U (6)  Indication: 65; Score: 6   Patient Information:   LMCA-CAD With Isolated LMCA stenosis  CABG  A (9)  Indication: 65; Score: 9   Patient Information:   LMCA-CAD Additional CAD, low CAD burden (i.e., 1- to 2-vessel additional involvement, low SYNTAX score) PCI  U (5)  Indication: 66; Score: 5   Patient Information:   LMCA-CAD Additional CAD, low CAD burden (i.e., 1- to 2-vessel additional involvement, low SYNTAX score) CABG  A (9)  Indication: 66; Score: 9   Patient Information:   LMCA-CAD Additional CAD, intermediate-high CAD burden (i.e., 3-vessel involvement, presence of CTO, or high SYNTAX score) PCI  I (3)  Indication: 67; Score: 3   Patient Information:   LMCA-CAD Additional CAD, intermediate-high CAD burden (i.e., 3-vessel involvement, presence of CTO, or high SYNTAX score) CABG  A (9)  Indication: 67; Score: 9  Todd [name]

## 2015-07-02 MED FILL — Heparin Sodium (Porcine) 2 Unit/ML in Sodium Chloride 0.9%: INTRAMUSCULAR | Qty: 500 | Status: AC

## 2015-07-02 MED FILL — Nitroglycerin IV Soln 100 MCG/ML in D5W: INTRA_ARTERIAL | Qty: 10 | Status: AC

## 2015-07-02 MED FILL — Lidocaine HCl Local Preservative Free (PF) Inj 1%: INTRAMUSCULAR | Qty: 30 | Status: AC

## 2015-07-04 ENCOUNTER — Emergency Department (HOSPITAL_COMMUNITY): Payer: BLUE CROSS/BLUE SHIELD

## 2015-07-04 ENCOUNTER — Emergency Department (HOSPITAL_COMMUNITY)
Admission: EM | Admit: 2015-07-04 | Discharge: 2015-07-05 | Disposition: A | Discharge status: Home / Self Care | Payer: BLUE CROSS/BLUE SHIELD | Attending: Emergency Medicine | Admitting: Emergency Medicine

## 2015-07-04 ENCOUNTER — Encounter (HOSPITAL_COMMUNITY): Payer: Self-pay | Admitting: Emergency Medicine

## 2015-07-04 DIAGNOSIS — I10 Essential (primary) hypertension: Secondary | ICD-10-CM | POA: Diagnosis not present

## 2015-07-04 DIAGNOSIS — E785 Hyperlipidemia, unspecified: Secondary | ICD-10-CM | POA: Insufficient documentation

## 2015-07-04 DIAGNOSIS — Z9889 Other specified postprocedural states: Secondary | ICD-10-CM | POA: Insufficient documentation

## 2015-07-04 DIAGNOSIS — R002 Palpitations: Secondary | ICD-10-CM | POA: Diagnosis not present

## 2015-07-04 DIAGNOSIS — R079 Chest pain, unspecified: Secondary | ICD-10-CM | POA: Insufficient documentation

## 2015-07-04 DIAGNOSIS — Z79899 Other long term (current) drug therapy: Secondary | ICD-10-CM | POA: Insufficient documentation

## 2015-07-04 DIAGNOSIS — Z8739 Personal history of other diseases of the musculoskeletal system and connective tissue: Secondary | ICD-10-CM | POA: Insufficient documentation

## 2015-07-04 DIAGNOSIS — Z8719 Personal history of other diseases of the digestive system: Secondary | ICD-10-CM | POA: Diagnosis not present

## 2015-07-04 DIAGNOSIS — G4733 Obstructive sleep apnea (adult) (pediatric): Secondary | ICD-10-CM | POA: Diagnosis not present

## 2015-07-04 DIAGNOSIS — E669 Obesity, unspecified: Secondary | ICD-10-CM | POA: Insufficient documentation

## 2015-07-04 LAB — CBC
HEMATOCRIT: 43.9 % (ref 39.0–52.0)
Hemoglobin: 14.5 g/dL (ref 13.0–17.0)
MCH: 26.9 pg (ref 26.0–34.0)
MCHC: 33 g/dL (ref 30.0–36.0)
MCV: 81.3 fL (ref 78.0–100.0)
Platelets: 204 10*3/uL (ref 150–400)
RBC: 5.4 MIL/uL (ref 4.22–5.81)
RDW: 14.6 % (ref 11.5–15.5)
WBC: 6.9 10*3/uL (ref 4.0–10.5)

## 2015-07-04 LAB — BASIC METABOLIC PANEL
ANION GAP: 12 (ref 5–15)
BUN: 12 mg/dL (ref 6–20)
CALCIUM: 9 mg/dL (ref 8.9–10.3)
CO2: 24 mmol/L (ref 22–32)
Chloride: 103 mmol/L (ref 101–111)
Creatinine, Ser: 1.11 mg/dL (ref 0.61–1.24)
GLUCOSE: 99 mg/dL (ref 65–99)
Potassium: 3.9 mmol/L (ref 3.5–5.1)
Sodium: 139 mmol/L (ref 135–145)

## 2015-07-04 LAB — I-STAT TROPONIN, ED: TROPONIN I, POC: 0.04 ng/mL (ref 0.00–0.08)

## 2015-07-04 NOTE — ED Provider Notes (Signed)
CSN: [id number]     Arrival date & time 07/04/15  2219 History   First MD Initiated Contact with Patient 07/04/15 2303     Chief Complaint  Patient presents with  . Chest Pain     (Consider location/radiation/quality/duration/timing/severity/associated sxs/prior Treatment) HPI Comments: Patient with episode of palpitations and pounding in his chest that onset around 9 PM while he was going to work. This lasted about 10 minutes and have since resolved. He denies any chest pain. He denies any shortness of breath, nausea or vomiting. Patient had cardiac catheterization 3 days ago by Dr. [name] that showed cardiomyopathy without any coronary artery lesions. He states compliance with his medications. He denies any difficulty breathing, swallowing, dizziness. No focal weakness, numbness or tingling. Denies ever having any chest pain just a feeling of palpitations in his chest.   The history is provided by the patient.    Past Medical History  Diagnosis Date  . GERD (gastroesophageal reflux disease)   . HLD (hyperlipidemia)   . OSA on CPAP   . Gout   . PVC (premature ventricular contraction)   . Dizziness   . Atypical chest pain   . IFG (impaired fasting glucose)   . Obesity   . Hypertension    Past Surgical History  Procedure Laterality Date  . Carpal tunnel release    . Cardiac catheterization N/A 07/01/2015    Procedure: Left Heart Cath;  Surgeon: Yates Decamp, MD;  Location: [facility] INVASIVE CV LAB;  Service: Cardiovascular;  Laterality: N/A;   Family History  Problem Relation Age of Onset  . Seizures Mother   . Deep vein thrombosis Mother   . COPD Mother   . Heart failure Mother   . Lung cancer Mother   . Prostate cancer Father   . Hypertension Father   . Diabetes Father    History  Substance Use Topics  . Smoking status: Never Smoker   . Smokeless tobacco: Not on file  . Alcohol Use: 0.0 oz/week    0 Standard drinks or equivalent per week     Comment: Rare occasion    Review  of Systems  Constitutional: Negative for fever, activity change and appetite change.  HENT: Negative for congestion and rhinorrhea.   Eyes: Negative for visual disturbance.  Respiratory: Negative for cough, chest tightness and shortness of breath.   Cardiovascular: Positive for chest pain and palpitations.  Gastrointestinal: Negative for nausea, vomiting and abdominal pain.  Genitourinary: Negative for dysuria, hematuria and testicular pain.  Musculoskeletal: Negative for myalgias and arthralgias.  Skin: Negative for rash.  Neurological: Negative for dizziness, weakness, light-headedness and headaches.  A complete 10 system review of systems was obtained and all systems are negative except as noted in the HPI and PMH.      Allergies  Review of patient's allergies indicates no known allergies.  Home Medications   Prior to Admission medications   Medication Sig Start Date End Date Taking? Authorizing Provider  albuterol (PROVENTIL HFA;VENTOLIN HFA) 108 (90 BASE) MCG/ACT inhaler Inhale 1-2 puffs into the lungs every 6 (six) hours as needed for wheezing or shortness of breath.    Yes Historical Provider, MD  atorvastatin (LIPITOR) 10 MG tablet Take 10 mg by mouth daily.   Yes Historical Provider, MD  carvedilol (COREG) 6.25 MG tablet Take 3.125 mg by mouth 2 (two) times daily with a meal.   Yes Historical Provider, MD  olmesartan (BENICAR) 40 MG tablet Take 40 mg by mouth daily.   Yes  Historical Provider, MD   BP 132/94 mmHg  Pulse 85  Temp(Src) 99.5 F (37.5 C) (Oral)  Resp 17  SpO2 98% Physical Exam  Constitutional: He is oriented to person, place, and time. He appears well-developed and well-nourished. No distress.  HENT:  Head: Normocephalic and atraumatic.  Mouth/Throat: Oropharynx is clear and moist. No oropharyngeal exudate.  Eyes: Conjunctivae and EOM are normal. Pupils are equal, round, and reactive to light.  Neck: Normal range of motion. Neck supple.  No meningismus.   Cardiovascular: Normal rate, regular rhythm, normal heart sounds and intact distal pulses.   No murmur heard. Frequent PVcs  Pulmonary/Chest: Effort normal and breath sounds normal. No respiratory distress. He exhibits no tenderness.  Abdominal: Soft. There is no tenderness. There is no rebound and no guarding.  Musculoskeletal: Normal range of motion. He exhibits no edema or tenderness.  Neurological: He is alert and oriented to person, place, and time. No cranial nerve deficit. He exhibits normal muscle tone. Coordination normal.  No ataxia on finger to nose bilaterally. No pronator drift. 5/5 strength throughout. CN 2-12 intact. Negative Romberg. Equal grip strength. Sensation intact. Gait is normal.   Skin: Skin is warm.  Psychiatric: He has a normal mood and affect. His behavior is normal.  Nursing note and vitals reviewed.   ED Course  Procedures (including critical care time) Labs Review Labs Reviewed  TROPONIN I - Abnormal; Notable for the following:    Troponin I 0.04 (*)    All other components within normal limits  BASIC METABOLIC PANEL  CBC  I-STAT TROPOININ, ED    Imaging Review Dg Chest 2 View  07/04/2015   CLINICAL DATA:  Irregular heartbeat.  EXAM: CHEST  2 VIEW  COMPARISON:  Chest radiograph December 05, 2004  FINDINGS: Cardiomediastinal silhouette is normal. Minimal LEFT lung base scarring. The lungs are clear without pleural effusions or focal consolidations. Trachea projects midline and there is no pneumothorax. Soft tissue planes and included osseous structures are non-suspicious. Mild degenerative change of the thoracic spine.  IMPRESSION: No active cardiopulmonary disease.   Electronically Signed   By: Awilda Metro M.D.   On: 07/04/2015 23:37     EKG Interpretation   Date/Time:  Saturday July 05 2015 00:36:15 EDT Ventricular Rate:  85 PR Interval:  165 QRS Duration: 114 QT Interval:  393 QTC Calculation: 467 R Axis:   -5 Text Interpretation:  Sinus  rhythm Incomplete right bundle branch block  Baseline wander in lead(s) V2 No significant change was found Confirmed by  Manus Gunning  MD, Luca Dyar ([id number]) on 07/05/2015 12:43:47 AM      MDM   Final diagnoses:  Palpitations   Palpitations ongoing for 10 minutes and now resolved. EKG with frequent PVCs. No CP or SOB.[name] "pounding in chest".  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 Marked LVH. Normal coronary arteries.   Palpitations with recent reassuring heart cath.  EF 30% however.  No chest pain or SOB. Labs reassuring.  Troponin minimally elevated. No tachycardia or hypoxia.  Doubt PE, doubt ACS.  Awaiting call back from Dr. [name]. Care transferred to [facility] at shift change.  Glynn Octave, MD 07/05/15 [phone]

## 2015-07-04 NOTE — ED Notes (Addendum)
C/o heart "fluttering" tonight and discomfort to L chest since 9pm.  Reports he has felt a little anxious and also reports mild nausea when starting to eat.  Negative cardiac cath on Tuesday and history of acid reflux.  Denies sob or any other symptoms.

## 2015-07-04 NOTE — ED Notes (Signed)
Patient presents stating earlier tonight he felt like there were "2 squirrels dancing on his chest" and would come and go.  Denies any SOB, N/V, diaphoresis

## 2015-07-05 LAB — TROPONIN I: TROPONIN I: 0.04 ng/mL — AB (ref <0.031)

## 2015-07-05 NOTE — ED Provider Notes (Signed)
  Physical Exam  BP 136/99 mmHg  Pulse 89  Temp(Src) 99.5 F (37.5 C) (Oral)  Resp 15  SpO2 98%  Physical Exam  Constitutional: He appears well-developed and well-nourished. No distress.  HENT:  Head: Atraumatic.  Eyes: Conjunctivae are normal.  Neck: Neck supple.  Neurological: He is alert.  Skin: No rash noted.  Psychiatric: He has a normal mood and affect.  Nursing note and vitals reviewed.   ED Course  Procedures  MDM Received pt at sign out.  Pt presents with heart palpitation with PVC on ECG.  No active CP.  Had clean cardiac cath 2 days ago.  Has non ischemic dilated cardiomyopathy with EF 30%.  Pt has been managed by cardiologist Dr. [name].  We have attempted to page Dr. [name] 4 times today but was unable to reach him.    1:53 AM I have consulted Dr. [name] who felt pt is stable for discharge.  He would like to see pt in office on Monday for further care.    BP 136/99 mmHg  Pulse 89  Temp(Src) 99.5 F (37.5 C) (Oral)  Resp 15  SpO2 98%  I have reviewed nursing notes and vital signs. I personally viewed the imaging tests through PACS system and agrees with radiologist's intepretation I reviewed available ER/hospitalization records through the EMR  Results for orders placed or performed during the hospital encounter of 07/04/15  Basic metabolic panel  Result Value Ref Range   Sodium 139 135 - 145 mmol/L   Potassium 3.9 3.5 - 5.1 mmol/L   Chloride 103 101 - 111 mmol/L   CO2 24 22 - 32 mmol/L   Glucose, Bld 99 65 - 99 mg/dL   BUN 12 6 - 20 mg/dL   Creatinine, Ser 2.84 0.61 - 1.24 mg/dL   Calcium 9.0 8.9 - 13.2 mg/dL   GFR calc non Af Amer >60 >60 mL/min   GFR calc Af Amer >60 >60 mL/min   Anion gap 12 5 - 15  CBC  Result Value Ref Range   WBC 6.9 4.0 - 10.5 K/uL   RBC 5.40 4.22 - 5.81 MIL/uL   Hemoglobin 14.5 13.0 - 17.0 g/dL   HCT 44.0 10.2 - 72.5 %   MCV 81.3 78.0 - 100.0 fL   MCH 26.9 26.0 - 34.0 pg   MCHC 33.0 30.0 - 36.0 g/dL   RDW 36.6 44.0 -  34.7 %   Platelets 204 150 - 400 K/uL  Troponin I  Result Value Ref Range   Troponin I 0.04 (H) <0.031 ng/mL  I-stat troponin, ED  Result Value Ref Range   Troponin i, poc 0.04 0.00 - 0.08 ng/mL   Comment 3           Dg Chest 2 View  07/04/2015   CLINICAL DATA:  Irregular heartbeat.  EXAM: CHEST  2 VIEW  COMPARISON:  Chest radiograph December 05, 2004  FINDINGS: Cardiomediastinal silhouette is normal. Minimal LEFT lung base scarring. The lungs are clear without pleural effusions or focal consolidations. Trachea projects midline and there is no pneumothorax. Soft tissue planes and included osseous structures are non-suspicious. Mild degenerative change of the thoracic spine.  IMPRESSION: No active cardiopulmonary disease.   Electronically Signed   By: Awilda Metro M.D.   On: 07/04/2015 23:37          Fayrene Helper, PA-C 07/05/15 0157  Glynn Octave, MD 07/05/15 [phone]

## 2015-07-05 NOTE — Discharge Instructions (Signed)
Please call and follow up with Dr. [name] on Monday for further evaluation of your condition.  Return if your symptoms worsen or if you have other concerns.  \Palpitations A palpitation is the feeling that your heartbeat is irregular or is faster than normal. It may feel like your heart is fluttering or skipping a beat. Palpitations are usually not a serious problem. However, in some cases, you may need further medical evaluation. CAUSES  Palpitations can be caused by:  Smoking.  Caffeine or other stimulants, such as diet pills or energy drinks.  Alcohol.  Stress and anxiety.  Strenuous physical activity.  Fatigue.  Certain medicines.  Heart disease, especially if you have a history of irregular heart rhythms (arrhythmias), such as atrial fibrillation, atrial flutter, or supraventricular tachycardia.  An improperly working pacemaker or defibrillator. DIAGNOSIS  To find the cause of your palpitations, your health care provider will take your medical history and perform a physical exam. Your health care provider may also have you take a test called an ambulatory electrocardiogram (ECG). An ECG records your heartbeat patterns over a 24-hour period. You may also have other tests, such as:  Transthoracic echocardiogram (TTE). During echocardiography, sound waves are used to evaluate how blood flows through your heart.  Transesophageal echocardiogram (TEE).  Cardiac monitoring. This allows your health care provider to monitor your heart rate and rhythm in real time.  Holter monitor. This is a portable device that records your heartbeat and can help diagnose heart arrhythmias. It allows your health care provider to track your heart activity for several days, if needed.  Stress tests by exercise or by giving medicine that makes the heart beat faster. TREATMENT  Treatment of palpitations depends on the cause of your symptoms and can vary greatly. Most cases of palpitations do not require  any treatment other than time, relaxation, and monitoring your symptoms. Other causes, such as atrial fibrillation, atrial flutter, or supraventricular tachycardia, usually require further treatment. HOME CARE INSTRUCTIONS   Avoid:  Caffeinated coffee, tea, soft drinks, diet pills, and energy drinks.  Chocolate.  Alcohol.  Stop smoking if you smoke.  Reduce your stress and anxiety. Things that can help you relax include:  A method of controlling things in your body, such as your heartbeats, with your mind (biofeedback).  Yoga.  Meditation.  Physical activity such as swimming, jogging, or walking.  Get plenty of rest and sleep. SEEK MEDICAL CARE IF:   You continue to have a fast or irregular heartbeat beyond 24 hours.  Your palpitations occur more often. SEEK IMMEDIATE MEDICAL CARE IF:  You have chest pain or shortness of breath.  You have a severe headache.  You feel dizzy or you faint. MAKE SURE YOU:  Understand these instructions.  Will watch your condition.  Will get help right away if you are not doing well or get worse. Document Released: 11/12/2000 Document Revised: 11/20/2013 Document Reviewed: 01/14/2012 [facility] Patient Information 2015 [place], [place]. This information is not intended to replace advice given to you by your health care provider. Make sure you discuss any questions you have with your health care provider.

## 2015-07-05 NOTE — ED Notes (Signed)
Discharge instructions reviewed, voiced understanding.  

## 2015-07-16 ENCOUNTER — Telehealth: Payer: Self-pay | Admitting: Neurology

## 2015-07-16 NOTE — Telephone Encounter (Signed)
This patient is wearing a life vest for his heart at the moment and has a CPAP scheduled for tomorrow night.  Is it ok to keep him on the schedule or should we reschedule him for a future date.  Please let me know what to do.

## 2015-07-16 NOTE — Telephone Encounter (Signed)
Should be okay, please check with Zella Ball too, will copy her.

## 2015-07-17 ENCOUNTER — Ambulatory Visit (INDEPENDENT_AMBULATORY_CARE_PROVIDER_SITE_OTHER): Payer: BLUE CROSS/BLUE SHIELD | Admitting: Neurology

## 2015-07-17 DIAGNOSIS — G4733 Obstructive sleep apnea (adult) (pediatric): Secondary | ICD-10-CM | POA: Diagnosis not present

## 2015-07-17 DIAGNOSIS — R9431 Abnormal electrocardiogram [ECG] [EKG]: Secondary | ICD-10-CM

## 2015-07-17 NOTE — Sleep Study (Signed)
Please see the scanned sleep study interpretation located in the procedure tab in the chart view section.  

## 2015-07-23 ENCOUNTER — Telehealth: Payer: Self-pay | Admitting: Neurology

## 2015-07-23 DIAGNOSIS — G4733 Obstructive sleep apnea (adult) (pediatric): Secondary | ICD-10-CM

## 2015-07-23 NOTE — Telephone Encounter (Signed)
I spoke to patient and he is aware of results and would like to start on CPAP at home. I will send order to [facility]. I will fax report to PCP and Dr. [name]. I will also send letter to patient advising him the importance of compliance and to remind him to make appt with [place].

## 2015-07-23 NOTE — Telephone Encounter (Signed)
Patient seen on 05/27/15, PSG on 06/24/15, CPAP study on 07/17/15, ins: [employer]. Please call and inform patient that I have entered an order for treatment with PAP. He did well during the latest sleep study with CPAP. We will, therefore, arrange for a machine for home use through a DME (durable medical equipment) company of His choice; and I will see the patient back in follow-up in about 8-10 weeks. Please also explain to the patient that I will be looking out for compliance data downloaded from the machine, which can be done remotely through a modem at times or stored on an SD card in the back of the machine. At the time of the followup appointment we will discuss sleep study results and how it is going with PAP treatment at home. Please advise patient to bring His machine at the time of the visit; at least for the first visit, even though this is cumbersome. Bringing the machine for every visit after that may not be needed, but often helps for the first visit. Please also make sure, the patient has a follow-up appointment with me in about 8-10 weeks from the setup date, thanks.   Huston Foley, MD, PhD [facility])

## 2015-07-30 ENCOUNTER — Encounter: Payer: Self-pay | Admitting: Nurse Practitioner

## 2015-08-07 DIAGNOSIS — G4733 Obstructive sleep apnea (adult) (pediatric): Secondary | ICD-10-CM | POA: Insufficient documentation

## 2015-08-25 ENCOUNTER — Encounter: Payer: Self-pay | Admitting: Cardiology

## 2015-08-25 ENCOUNTER — Ambulatory Visit (INDEPENDENT_AMBULATORY_CARE_PROVIDER_SITE_OTHER): Payer: BLUE CROSS/BLUE SHIELD | Admitting: Cardiology

## 2015-08-25 VITALS — BP 140/85 | HR 96 | Ht 66.0 in | Wt 252.0 lb

## 2015-08-25 DIAGNOSIS — I5022 Chronic systolic (congestive) heart failure: Secondary | ICD-10-CM

## 2015-08-25 DIAGNOSIS — I10 Essential (primary) hypertension: Secondary | ICD-10-CM | POA: Diagnosis not present

## 2015-08-25 DIAGNOSIS — I420 Dilated cardiomyopathy: Secondary | ICD-10-CM | POA: Diagnosis not present

## 2015-08-25 NOTE — Progress Notes (Signed)
Cardiology Office Note   Date:  08/25/2015   ID:  Todd [name], DOB 1966/01/01, MRN [id number]  PCP:  Martha Clan, MD  Cardiologist:   Donato Schultz, MD       History of Present Illness: Todd [name] is a 49 y.o. male here for evaluation of systolic heart failure/cardiomyopathy discovered on 04/29/15 echo with EF of 30%, atypical chest pain. He was seen previously and admitted on 06/30/15 by Dr. [name] and is currently seeking second opinion. He was referred initially for atypical chest pain described as a running sensation, ache sometimes relieved with burping, difficult to obtain a clear history according to review of prior note.  He had a previous echo and was found to have severe left ventricular systolic dysfunction. Carvedilol was begun. Benicar was started. He had severe fatigue, dizziness and discontinued medications and went back to [place].  He underwent nuclear stress test on 05/12/15 that showed an inferior wall scar with Todd [name] infarct ischemia and severe anterior apical ischemia in the apex, nuclear ejection fraction was calculated at 19%.  Creatinine 0.9, LDL 114, hemoglobin A1c 5.5.  Has comorbidities of obstructive sleep apnea intolerant of CPAP bur is now trying.  Has impaired fasting glucose, nonsmoker.  Dr. [name] performed cardiac catheterization. Nonischemic cardio myopathy, no significant CAD. Felt fatigued and weak. He started him on metoprolol 25 mg one half tablet twice a day. Half tablet of Benicar. He also agreed to take atorvastatin 10 mg a day.  GERD - Dr. Kinnie Scales. Asked him to eat small meals. He does feel better after he eats a small medial he states.   Past Medical History  Diagnosis Date  . GERD (gastroesophageal reflux disease)   . HLD (hyperlipidemia)   . OSA on CPAP   . Gout   . PVC (premature ventricular contraction)   . Dizziness   . Atypical chest pain   . IFG (impaired fasting glucose)   . Obesity   . Hypertension     Past Surgical  History  Procedure Laterality Date  . Carpal tunnel release    . Cardiac catheterization N/A 07/01/2015    Procedure: Left Heart Cath;  Surgeon: Yates Decamp, MD;  Location: [facility] INVASIVE CV LAB;  Service: Cardiovascular;  Laterality: N/A;     Current Outpatient Prescriptions  Medication Sig Dispense Refill  . albuterol (PROVENTIL HFA;VENTOLIN HFA) 108 (90 BASE) MCG/ACT inhaler Inhale 1-2 puffs into the lungs every 6 (six) hours as needed for wheezing or shortness of breath.     [name] atorvastatin (LIPITOR) 10 MG tablet Take 10 mg by mouth daily.    . metoprolol tartrate (LOPRESSOR) 25 MG tablet Take 25 mg by mouth 2 (two) times daily.     [name] NITROSTAT 0.4 MG SL tablet Place 0.4 mg under the tongue every 5 (five) minutes as needed for chest pain.     [name] olmesartan (BENICAR) 40 MG tablet Take 40 mg by mouth daily. FINISH BEFORE STARTING VALSARTAN    . valsartan (DIOVAN) 160 MG tablet Take 160 mg by mouth daily. START AFTER YOU FINISH BENICAR     No current facility-administered medications for this visit.    Allergies:   Review of patient's allergies indicates no known allergies.    Social History:  The patient  reports that he has never smoked. He does not have any smokeless tobacco history on file. He reports that he drinks alcohol. He reports that he does not use illicit drugs. he is married, 1 child  Family  History:  The patient's family history includes COPD in his mother; Deep vein thrombosis in his mother; Diabetes in his father; Heart failure in his mother; Hypertension in his father; Lung cancer in his mother; Prostate cancer in his father; Seizures in his mother; Stroke in his paternal grandfather. There is no history of Heart attack.  Mother has history of seizures, DVT, COPD,  with no prior heart attacks. His father is in stable health no prior heart attacks, strokes. Father died recently with dementia.   ROS:  Please see the history of present illness.   Otherwise, review of systems are  positive for none.   All other systems are reviewed and negative.    PHYSICAL EXAM: VS:  BP 140/85 mmHg  Pulse 96  Ht 8' (2.438 m)  Wt 252 lb (114.306 kg)  BMI 19.23 kg/m2  PF 56 L/min , BMI Body mass index is 19.23 kg/(m^2). GEN: Well nourished, well developed, in no acute distress HEENT: normal Neck: no significant JVD, thick neck, carotid bruits, or masses Cardiac: RRR; no murmurs, rubs, or gallops,no edema  Respiratory:  clear to auscultation bilaterally, normal work of breathing GI: soft, nontender, nondistended, + BS obese MS: no deformity or atrophy Skin: warm and dry, no rash Neuro:  Strength and sensation are intact Psych: euthymic mood, full affect   EKG 05/29/2015: Normal sinus rhythm at rate of 80 bpm, normal axis, incomplete right bundle branch block. Frequent PVCs. Nonspecific T abnormality. Compared to EKG 04/09/2015 PVC new.  Echo- 04/29/2015 1. Left ventricle cavity is moderately dilated. Dilated cardiomyopathy. Moderate concentric hypertrophy of the left ventricle. Severe decrease in global wall motion. Doppler evidence of grade I (impaired) diastolic dysfunction. Severely decreased systolic function. Calculated EF 30%. 2. Trace aortic regurgitation. 3. Mild mitral regurgitation. 4. Trace tricuspid regurgitation  Nuclear stress test06/13/2016 1. Resting EKG demonstrated normal sinus rhythm, leftward axis, incomplete right bundle branch block, poor R-wave progression. Stress EKG was negative for myocardial ischemia. Patient exercised for 7 minutes and 27 seconds and achieved 8.7 METS. Stress test was terminated due to shortness of breath, target heart rate (85%) achieved. There are frequent PVCs, ventricular couplets and brief ventricular bigeminy in the recovery phase of the stress EKG. Normal blood pressure response. 2. The left is maarkedly dilated in both in rest and stress images. The perfusion imaging study demonstrates inferior wall, inferolateral nontransmural  myocardial infarction with very mild peri-infarct ischemia from base towards the mid ventricle. In addition there is small sized moderate to severe ischemia in the anteroseptal region extending distally to the apex. The left ventricular systolic function Calculated by QGS was 19% with diffuse global hypokinesis, inferior akinesis. Overall this represents high risk study, multivessel disease not be excluded. Consider further cardiac workup.  Cardiac catheterization 07/01/15-no coronary artery disease, LVEDP 8 mmHg, EF 30%  Recent Labs: 07/04/2015: BUN 12; Creatinine, Ser 1.11; Hemoglobin 14.5; Platelets 204; Potassium 3.9; Sodium 139    Lipid Panel No results found for: CHOL, TRIG, HDL, CHOLHDL, VLDL, LDLCALC, LDLDIRECT    Wt Readings from Last 3 Encounters:  08/25/15 252 lb (114.306 kg)  07/01/15 248 lb (112.492 kg)  05/27/15 252 lb (114.306 kg)      Other studies Reviewed: Additional studies/ records that were reviewed today include: Office notes, lab work, catheterization, EKG. Review of the above records demonstrates: As above   ASSESSMENT AND PLAN:  1.  Nonischemic cardiomyopathy/chronic systolic heart failure-ejection fraction 30%. I agree with Dr. Verl Dicker plan as laid out. Trying to up  titrate beta blockers. Explain him that some fatigue and weakness can be a side effect of beta blockers however beta blockers and ACE inhibitor/angiotensin receptor blockers are the pillars of treatment for cardiomyopathy. He is currently wearing a LifeVest. He has an echocardiogram scheduled in the future to reevaluate ejection fraction. If after medical therapy, ejection fraction remains less than 35%, consider ICD. We discussed. Continue with weight loss. Continue with CPAP treatment. Left ventricular end-diastolic pressure was 8 mmHg. He appears well compensated. Consider addition of spironolactone in the future. If heart failure advances, we could consider advance therapies if necessary. Consider  transition from metoprolol tartrate to metoprolol succinate for treatment of heart failure.  2. Morbid obesity-continue to encourage weight loss.  3. Hyperlipidemia-continue with low-dose atorvastatin.  4. Essential hypertension-possible culprit for cardiomyopathy. Increasing beta blocker as outpatient per Dr. [name]   Current medicines are reviewed at length with the patient today.  The patient does not have concerns regarding medicines.  The following changes have been made:  no change  Labs/ tests ordered today include: none  No orders of the defined types were placed in this encounter.     Disposition:   Follow-up with me on as-needed basis.  Mathews Robinsons, MD  08/25/2015 8:27 AM    [facility] [address] [place], [place], [place]  [address] Phone: [phone]; Fax: [phone]

## 2015-08-25 NOTE — Patient Instructions (Signed)
Medication Instructions:  The current medical regimen is effective;  continue present plan and medications.  Follow-Up: Follow up as needed with Dr Skains.  Thank you for choosing [facility]!!     

## 2015-10-27 IMAGING — DX DG CHEST 2V
2 series · 2 of 2 positions shown · non-contrast
Comparison: Chest radiograph December 05, 2004

CLINICAL DATA: Irregular heartbeat.

EXAM:
CHEST  2 VIEW

[chest pa]
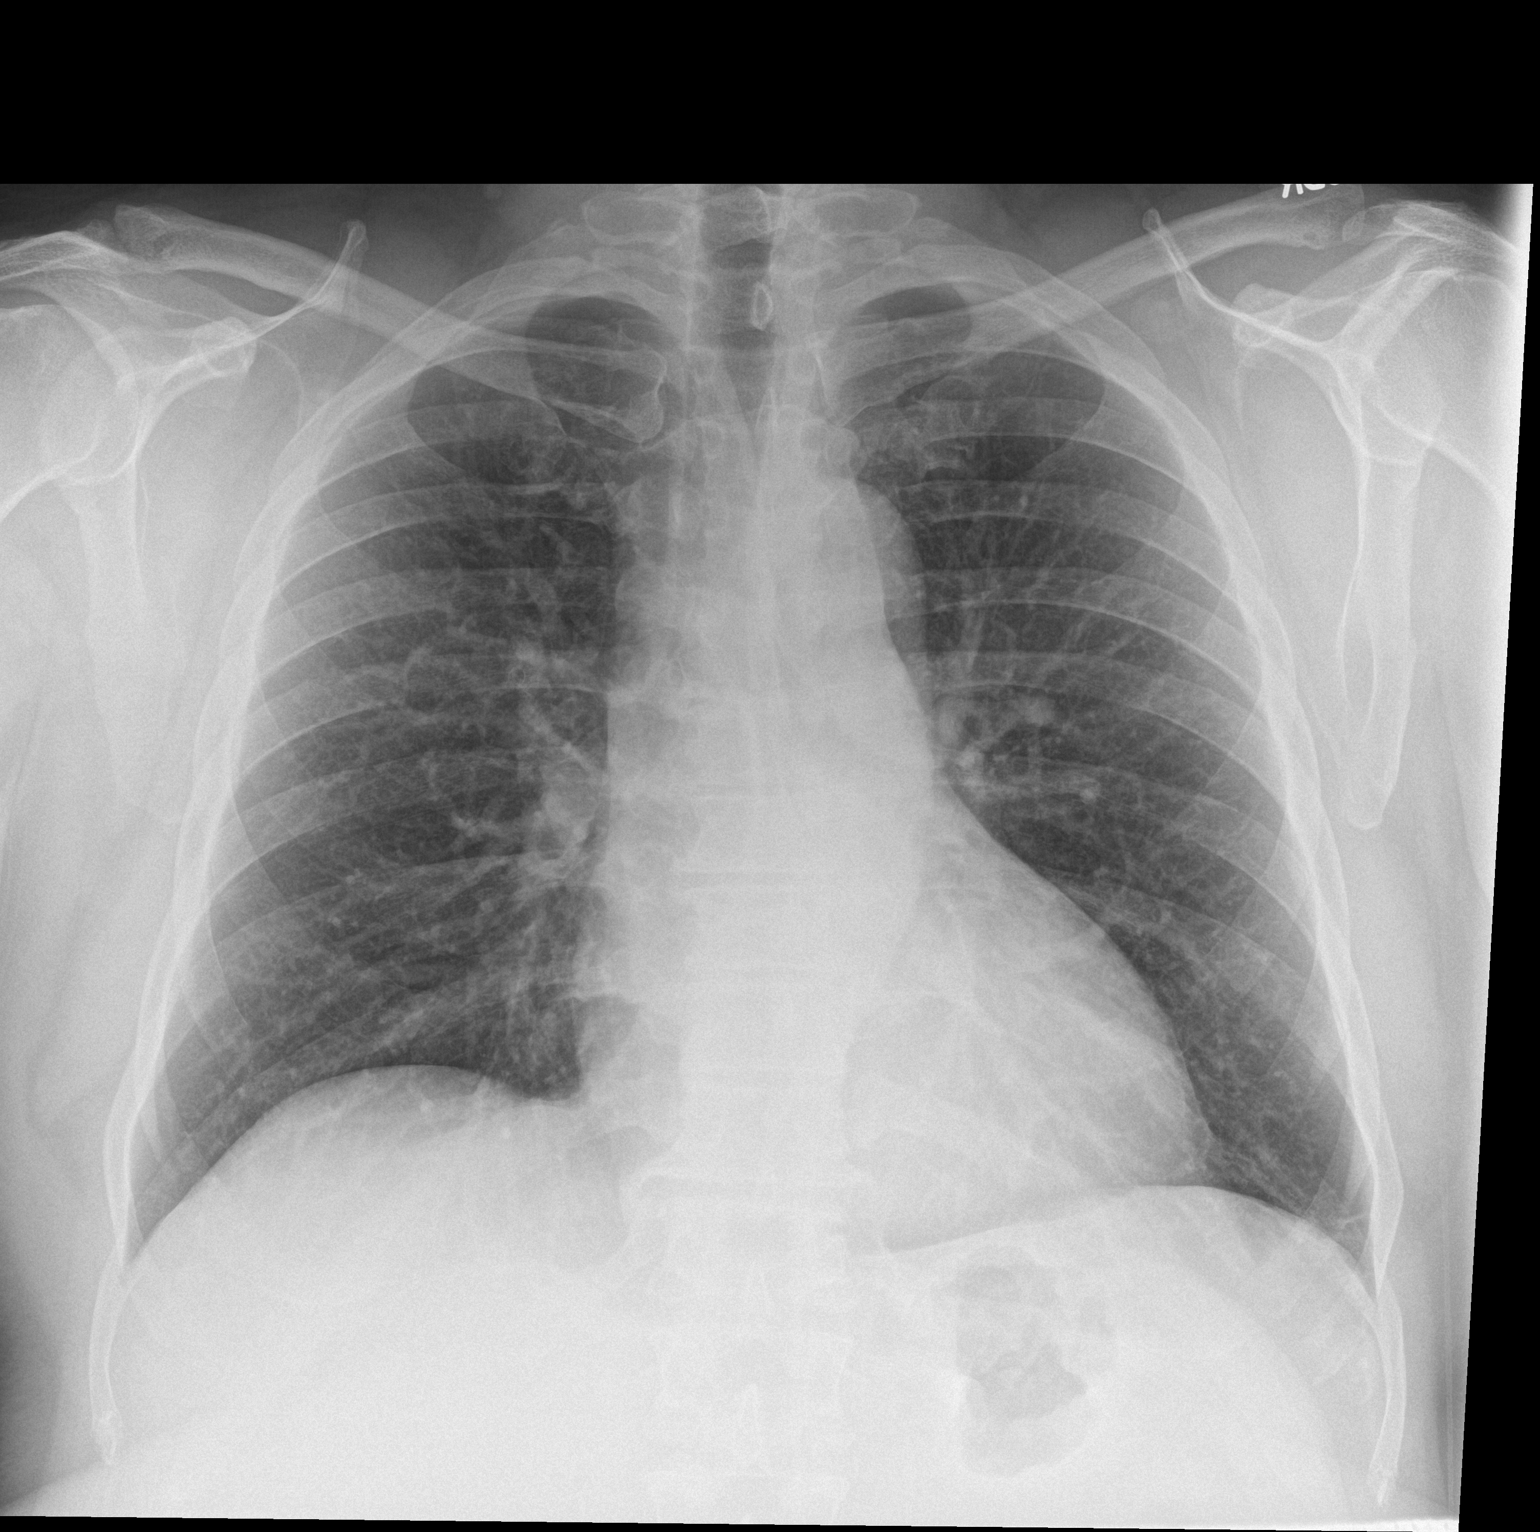

[chest lat]
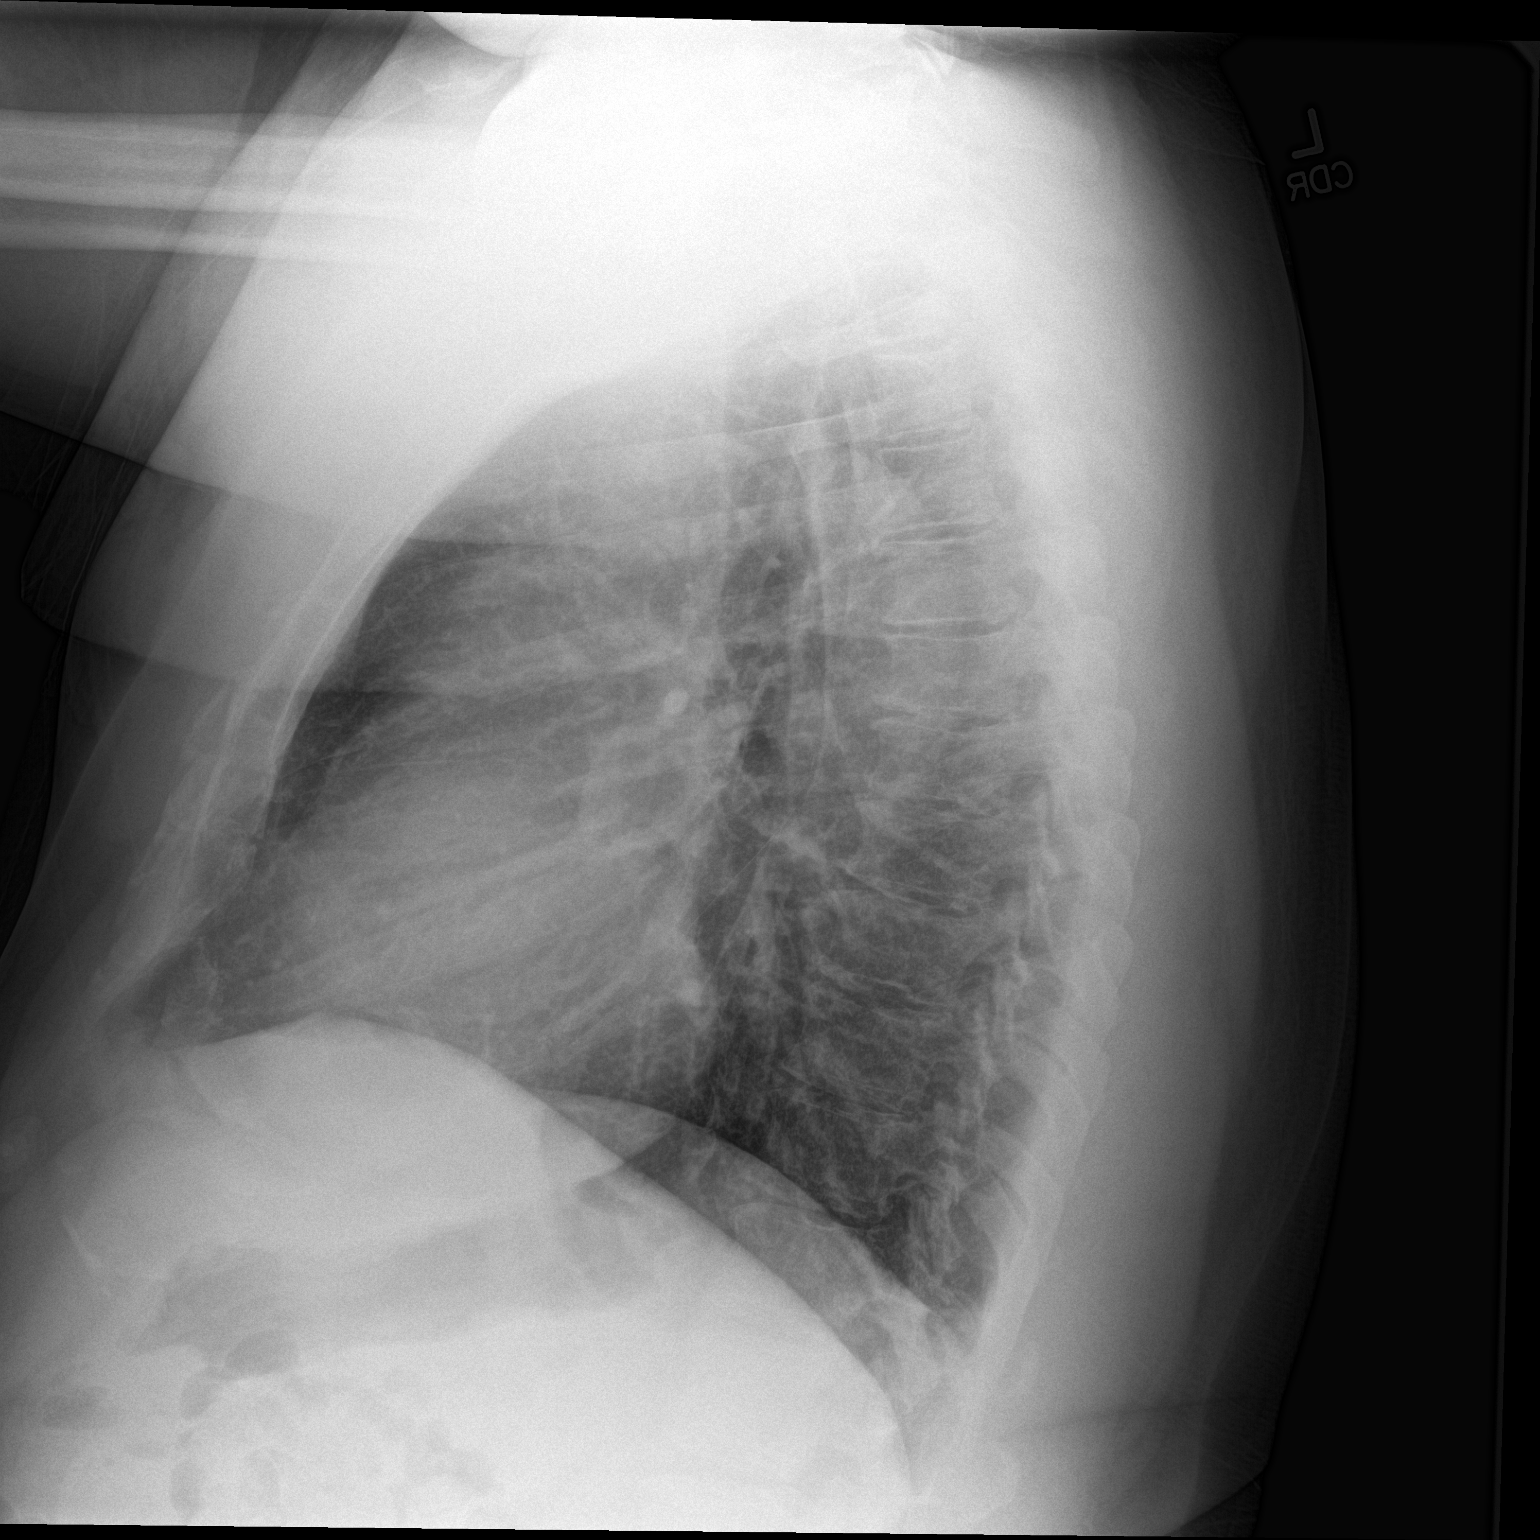

[2 of 2 positions shown; findings below may reference images not displayed]

FINDINGS: Cardiomediastinal silhouette is normal. Minimal LEFT lung base
scarring. The lungs are clear without pleural effusions or focal
consolidations. Trachea projects midline and there is no
pneumothorax. Soft tissue planes and included osseous structures are
non-suspicious. Mild degenerative change of the thoracic spine.
IMPRESSION: No active cardiopulmonary disease.

## 2015-12-07 ENCOUNTER — Other Ambulatory Visit: Payer: Self-pay | Admitting: Cardiology

## 2015-12-07 ENCOUNTER — Encounter: Payer: Self-pay | Admitting: Cardiology

## 2015-12-07 ENCOUNTER — Encounter (HOSPITAL_COMMUNITY): Payer: Self-pay | Admitting: *Deleted

## 2015-12-07 ENCOUNTER — Inpatient Hospital Stay (HOSPITAL_COMMUNITY)
Admission: AD | Admit: 2015-12-07 | Discharge: 2015-12-10 | DRG: 309 | Disposition: A | Discharge status: Home / Self Care | Payer: BLUE CROSS/BLUE SHIELD | Source: Ambulatory Visit | Attending: Cardiology | Admitting: Cardiology

## 2015-12-07 DIAGNOSIS — E785 Hyperlipidemia, unspecified: Secondary | ICD-10-CM | POA: Diagnosis present

## 2015-12-07 DIAGNOSIS — I480 Paroxysmal atrial fibrillation: Principal | ICD-10-CM | POA: Diagnosis present

## 2015-12-07 DIAGNOSIS — G4733 Obstructive sleep apnea (adult) (pediatric): Secondary | ICD-10-CM | POA: Diagnosis present

## 2015-12-07 DIAGNOSIS — I10 Essential (primary) hypertension: Secondary | ICD-10-CM

## 2015-12-07 DIAGNOSIS — K219 Gastro-esophageal reflux disease without esophagitis: Secondary | ICD-10-CM | POA: Diagnosis present

## 2015-12-07 DIAGNOSIS — R739 Hyperglycemia, unspecified: Secondary | ICD-10-CM | POA: Diagnosis present

## 2015-12-07 DIAGNOSIS — I5042 Chronic combined systolic (congestive) and diastolic (congestive) heart failure: Secondary | ICD-10-CM | POA: Diagnosis present

## 2015-12-07 DIAGNOSIS — Z6837 Body mass index (BMI) 37.0-37.9, adult: Secondary | ICD-10-CM | POA: Diagnosis not present

## 2015-12-07 DIAGNOSIS — R002 Palpitations: Secondary | ICD-10-CM | POA: Diagnosis present

## 2015-12-07 DIAGNOSIS — Z9989 Dependence on other enabling machines and devices: Secondary | ICD-10-CM

## 2015-12-07 DIAGNOSIS — M109 Gout, unspecified: Secondary | ICD-10-CM | POA: Diagnosis present

## 2015-12-07 DIAGNOSIS — I420 Dilated cardiomyopathy: Secondary | ICD-10-CM | POA: Diagnosis present

## 2015-12-07 DIAGNOSIS — E668 Other obesity: Secondary | ICD-10-CM

## 2015-12-07 DIAGNOSIS — E669 Obesity, unspecified: Secondary | ICD-10-CM

## 2015-12-07 DIAGNOSIS — I110 Hypertensive heart disease with heart failure: Secondary | ICD-10-CM | POA: Diagnosis present

## 2015-12-07 LAB — COMPREHENSIVE METABOLIC PANEL
ALBUMIN: 3.7 g/dL (ref 3.5–5.0)
ALK PHOS: 47 U/L (ref 38–126)
ALT: 28 U/L (ref 17–63)
AST: 19 U/L (ref 15–41)
Anion gap: 8 (ref 5–15)
BILIRUBIN TOTAL: 0.8 mg/dL (ref 0.3–1.2)
BUN: 12 mg/dL (ref 6–20)
CALCIUM: 9 mg/dL (ref 8.9–10.3)
CO2: 28 mmol/L (ref 22–32)
Chloride: 104 mmol/L (ref 101–111)
Creatinine, Ser: 0.93 mg/dL (ref 0.61–1.24)
GFR calc Af Amer: >60 mL/min (ref >60)
GLUCOSE: 103 mg/dL — AB (ref 65–99)
Potassium: 4 mmol/L (ref 3.5–5.1)
Sodium: 140 mmol/L (ref 135–145)
TOTAL PROTEIN: 6.6 g/dL (ref 6.5–8.1)

## 2015-12-07 LAB — BRAIN NATRIURETIC PEPTIDE: B Natriuretic Peptide: 148.2 pg/mL — ABNORMAL HIGH (ref 0.0–100.0)

## 2015-12-07 LAB — MAGNESIUM: Magnesium: 2.1 mg/dL (ref 1.7–2.4)

## 2015-12-07 MED ORDER — SODIUM CHLORIDE 0.9 % IV SOLN: 250.0000 mL | INTRAVENOUS | Status: DC | PRN

## 2015-12-07 MED ORDER — ONDANSETRON HCL 4 MG/2ML IJ SOLN: 4.0000 mg | Freq: Four times a day (QID) | INTRAMUSCULAR | Status: DC | PRN

## 2015-12-07 MED ORDER — SODIUM CHLORIDE 0.9 % IJ SOLN: 3.0000 mL | INTRAMUSCULAR | Status: DC | PRN

## 2015-12-07 MED ORDER — ACETAMINOPHEN 325 MG PO TABS
650.0000 mg | ORAL_TABLET | ORAL | Status: DC | PRN
  Administered 2015-12-07: 650 mg via ORAL
  Filled 2015-12-07: qty 2

## 2015-12-07 MED ORDER — DOFETILIDE 250 MCG PO CAPS
500.0000 ug | ORAL_CAPSULE | ORAL | Status: AC
  Administered 2015-12-07: 500 ug via ORAL
  Filled 2015-12-07: qty 2

## 2015-12-07 MED ORDER — ATORVASTATIN CALCIUM 10 MG PO TABS
10.0000 mg | ORAL_TABLET | Freq: Every day | ORAL | Status: DC
  Administered 2015-12-07 – 2015-12-09 (×3): 10 mg via ORAL
  Filled 2015-12-07 (×3): qty 1

## 2015-12-07 MED ORDER — LISINOPRIL 10 MG PO TABS
10.0000 mg | ORAL_TABLET | Freq: Every day | ORAL | Status: DC
  Administered 2015-12-07 – 2015-12-10 (×4): 10 mg via ORAL
  Filled 2015-12-07 (×4): qty 1

## 2015-12-07 MED ORDER — RIVAROXABAN 20 MG PO TABS
20.0000 mg | ORAL_TABLET | Freq: Every day | ORAL | Status: DC
  Administered 2015-12-07 – 2015-12-09 (×3): 20 mg via ORAL
  Filled 2015-12-07 (×3): qty 1

## 2015-12-07 MED ORDER — SODIUM CHLORIDE 0.9 % IJ SOLN
3.0000 mL | Freq: Two times a day (BID) | INTRAMUSCULAR | Status: DC
  Administered 2015-12-07 – 2015-12-10 (×3): 3 mL via INTRAVENOUS

## 2015-12-07 MED ORDER — SODIUM CHLORIDE 0.9 % IJ SOLN
3.0000 mL | INTRAMUSCULAR | Status: DC | PRN
Start: 2015-12-07 — End: 2015-12-10

## 2015-12-07 MED ORDER — CARVEDILOL 6.25 MG PO TABS
6.2500 mg | ORAL_TABLET | Freq: Two times a day (BID) | ORAL | Status: DC
  Administered 2015-12-07 – 2015-12-10 (×6): 6.25 mg via ORAL
  Filled 2015-12-07 (×6): qty 1

## 2015-12-07 MED ORDER — SODIUM CHLORIDE 0.9 % IJ SOLN
3.0000 mL | Freq: Two times a day (BID) | INTRAMUSCULAR | Status: DC
  Administered 2015-12-08 – 2015-12-10 (×5): 3 mL via INTRAVENOUS

## 2015-12-07 MED ORDER — DOFETILIDE 250 MCG PO CAPS
500.0000 ug | ORAL_CAPSULE | Freq: Two times a day (BID) | ORAL | Status: DC
  Administered 2015-12-08 – 2015-12-10 (×5): 500 ug via ORAL
  Filled 2015-12-07 (×5): qty 2

## 2015-12-07 MED ORDER — ALPRAZOLAM 0.25 MG PO TABS: 0.2500 mg | ORAL_TABLET | Freq: Three times a day (TID) | ORAL | Status: DC | PRN

## 2015-12-07 NOTE — H&P (Signed)
Todd [name] is an 50 y.o. male.   Chief Complaint: Palpitations and fatigue HPI:  Patient with nonischemic dilated cardiomyopathy, echocardiogram on 04/29/2015 revealing LVEF 30%, due to abnormal nuclear stress test on 05/12/2015 revealing inferior wall scar with mild ischemia and LVEF 19%, underwent angiography on 07/01/2015 revealing no significant coronary artery disease. He has multiple medication intolerances.  He has been c/o palpitations recently and outpatient event monitor shows A. Fib with  RVR @ 150/min. Although no symptoms of CHF, due to his cardiomyopathy and his CV risks, felt best option is admission to the hospital and initiate Tikosyn therapy.  Although asymptomatic presently except for chronic mild dyspnea, best option is for admission to the hospital for Tikosyn administration.   Past Medical History  Diagnosis Date  . GERD (gastroesophageal reflux disease)   . HLD (hyperlipidemia)   . OSA on CPAP   . Gout   . PVC (premature ventricular contraction)   . Dizziness   . Atypical chest pain   . IFG (impaired fasting glucose)   . Obesity   . Hypertension   . CHF (congestive heart failure) (HCC)   . Shortness of breath dyspnea     Past Surgical History  Procedure Laterality Date  . Carpal tunnel release    . Cardiac catheterization N/A 07/01/2015    Procedure: Left Heart Cath;  Surgeon: Yates Decamp, MD;  Location: [facility] INVASIVE CV LAB;  Service: Cardiovascular;  Laterality: N/A;    Family History  Problem Relation Age of Onset  . Seizures Mother   . Deep vein thrombosis Mother   . COPD Mother   . Heart failure Mother   . Lung cancer Mother   . Prostate cancer Father   . Hypertension Father   . Diabetes Father   . Heart attack Neg Hx   . Stroke Paternal Grandfather    Social History:  reports that he has never smoked. He has never used smokeless tobacco. He reports that he drinks about 0.6 oz of alcohol per week. He reports that he does not use illicit  drugs.  Allergies:  Allergies  Allergen Reactions  . Entresto [Sacubitril-Valsartan] Rash    Review of Systems  Constitutional: Positive for malaise/fatigue. Negative for fever.  Eyes: Negative for blurred vision.  Respiratory: Negative for cough and hemoptysis.   Cardiovascular: Positive for palpitations. Negative for chest pain, orthopnea, claudication and leg swelling.  Gastrointestinal: Negative for abdominal pain.  Musculoskeletal: Negative for myalgias.  Skin: Positive for rash.  Neurological: Negative for dizziness and loss of consciousness.  Psychiatric/Behavioral: The patient is nervous/anxious.     Blood pressure 132/91, pulse 66, temperature 98.6 F (37 C), temperature source Oral, resp. rate 18, height 5\' 6"  (1.676 m), weight 104.781 kg (231 lb), SpO2 97 %.   Physical Exam  Constitutional: He is oriented to person, place, and time. He appears well-developed.  Neck: Neck supple.  Cardiovascular: Normal rate, regular rhythm, normal heart sounds and intact distal pulses.  Exam reveals no gallop.   No murmur heard. Respiratory: Effort normal and breath sounds normal.  GI: Soft. Bowel sounds are normal.  Musculoskeletal: Normal range of motion. He exhibits no edema.  Neurological: He is alert and oriented to person, place, and time.  Skin: Skin is warm and dry.   Coronary angiogram 07/01/2015: Normal coronary arteries. LVEF 25-35%   Echocardiogram 09/02/2015: Left ventricle cavity is normal in size. Moderate concentric hypertrophy of the left ventricle. Severe decrease in LV systolic function. Normal diastolic filling pattern. Left  ventricle regional wall motion findings: No wall motion abnormalities. Calculated EF 29%. Left atrial cavity is moderately dilated. Mild mitral regurgitation. Trace tricuspid regurgitation. No evidence of pulmonary hypertension. No significant change from Echo 04/29/2015.  EKG 12/07/2015: Normal sinus rhythm at rate of 63 bpm, normal axis,  IRBBB nonspecific lateral T abnormality.  No significant change from prior EKG on 07/05/2015.  Normal QT interval.  Assessment/Plan 1.  Paroxysmal atrial fibrillation, symptomatic, last episode 12/06/2015 on event monitoring. 2.  Nonischemic dilated cardiomyopathy with severe LV systolic dysfunction, EF 25-30%. 3.  Chronic systolic and diastolic heart failure 4.  Hypertension 5.  Hyperlipidemia 6.  Severe obstructive sleep apnea presently on CPAP 7.  Moderate obesity 8.  Hyperglycemia  Recommendation: Patient with multiple medication intolerance, young age, although completely asymptomatic at this point, I will admit the patient for Tikosyn administration and observation.  I'll obtain baseline labs.  Patient is aware of the risks and benefits. I will also obtain EP consultation, non-emergent.  Yates Decamp 12/07/2015, 1:46 PM

## 2015-12-07 NOTE — Discharge Instructions (Signed)

## 2015-12-07 NOTE — H&P (Signed)
Todd [name] is an 50 y.o. male.   DUPLICATE Unable to delete  Chief Complaint: Palpitations and fatigue HPI:  Patient with nonischemic dilated cardiomyopathy, echocardiogram on 04/29/2015 revealing LVEF 30%, due to abnormal nuclear stress test on 05/12/2015 revealing inferior wall scar with mild ischemia and LVEF 19%, underwent angiography on 07/01/2015 revealing no significant coronary artery disease. He has multiple medication intolerances.  He has been c/o palpitations recently and outpatient event monitor shows A. Fib with  RVR @ 150/min. Although no symptoms of CHF, due to his cardiomyopathy and his CV risks, felt best option is admission to the hospital and initiate Tikosyn therapy.  Although asymptomatic presently except for chronic mild dyspnea, best option is for admission to the hospital for Tikosyn administration.   Past Medical History  Diagnosis Date  . GERD (gastroesophageal reflux disease)   . HLD (hyperlipidemia)   . OSA on CPAP   . Gout   . PVC (premature ventricular contraction)   . Dizziness   . Atypical chest pain   . IFG (impaired fasting glucose)   . Obesity   . Hypertension   . CHF (congestive heart failure) (HCC)   . Shortness of breath dyspnea     Past Surgical History  Procedure Laterality Date  . Carpal tunnel release    . Cardiac catheterization N/A 07/01/2015    Procedure: Left Heart Cath;  Surgeon: Yates Decamp, MD;  Location: [facility] INVASIVE CV LAB;  Service: Cardiovascular;  Laterality: N/A;    Family History  Problem Relation Age of Onset  . Seizures Mother   . Deep vein thrombosis Mother   . COPD Mother   . Heart failure Mother   . Lung cancer Mother   . Prostate cancer Father   . Hypertension Father   . Diabetes Father   . Heart attack Neg Hx   . Stroke Paternal Grandfather    Social History:  reports that he has never smoked. He has never used smokeless tobacco. He reports that he drinks about 0.6 oz of alcohol per week. He reports that he does  not use illicit drugs.  Allergies: No Known Allergies  Review of Systems  Constitutional: Positive for malaise/fatigue. Negative for fever.  Eyes: Negative for blurred vision.  Respiratory: Negative for cough and hemoptysis.   Cardiovascular: Positive for palpitations. Negative for chest pain, orthopnea, claudication and leg swelling.  Gastrointestinal: Negative for abdominal pain.  Musculoskeletal: Negative for myalgias.  Neurological: Negative for dizziness and loss of consciousness.  Psychiatric/Behavioral: The patient is nervous/anxious.     Physical Exam  Constitutional: He is oriented to person, place, and time. He appears well-developed.  Neck: Neck supple.  Cardiovascular: Normal rate, regular rhythm, normal heart sounds and intact distal pulses.  Exam reveals no gallop.   No murmur heard. Respiratory: Effort normal and breath sounds normal.  GI: Soft. Bowel sounds are normal.  Musculoskeletal: Normal range of motion. He exhibits no edema.  Neurological: He is alert and oriented to person, place, and time.  Skin: Skin is warm and dry.   Coronary angiogram 07/01/2015: Normal coronary arteries. LVEF 25-35%   Echocardiogram 09/02/2015: Left ventricle cavity is normal in size. Moderate concentric hypertrophy of the left ventricle. Severe decrease in LV systolic function. Normal diastolic filling pattern. Left ventricle regional wall motion findings: No wall motion abnormalities. Calculated EF 29%. Left atrial cavity is moderately dilated. Mild mitral regurgitation. Trace tricuspid regurgitation. No evidence of pulmonary hypertension. No significant change from Echo 04/29/2015.  EKG 12/07/2015: Normal sinus  rhythm at rate of 63 bpm, normal axis, IRBBB nonspecific lateral T abnormality.  No significant change from prior EKG on 07/05/2015.  Normal QT interval.  Assessment/Plan 1.  Paroxysmal atrial fibrillation, symptomatic, last episode 12/06/2015 on event monitoring. 2.   Nonischemic dilated cardiomyopathy with severe LV systolic dysfunction, EF 25-30%. 3.  Chronic systolic and diastolic heart failure 4.  Hypertension 5.  Hyperlipidemia 6.  Severe obstructive sleep apnea presently on CPAP 7.  Moderate obesity 8.  Hyperglycemia  Recommendation: Patient with multiple medication intolerance, young age, although completely asymptomatic at this point, I will admit the patient for Tikosyn administration and observation.  I'll obtain baseline labs.  Patient is aware of the risks and benefits. I will also obtain EP consultation, non-emergent.  Todd [name], Todd [name] 12/07/2015, 9:04 AM

## 2015-12-07 NOTE — Progress Notes (Addendum)
Pharmacy Review for Dofetilide (Tikosyn) Initiation  Admit Complaint: 50 y.o. male admitted 12/07/2015 with atrial fibrillation to be initiated on dofetilide.   Assessment:  Patient Exclusion Criteria: If any screening criteria checked as "Yes", then  patient  should NOT receive dofetilide until criteria item is corrected. If "Yes" please indicate correction plan.  YES  NO Patient  Exclusion Criteria Correction Plan  []  [x]  Baseline QTc interval is greater than or equal to 440 msec. IF above YES box checked dofetilide contraindicated unless patient has ICD; then may proceed if QTc 500-550 msec or with known ventricular conduction abnormalities may proceed with QTc 550-600 msec. QTc =   QTC 421  []  [x]  Magnesium level is less than 1.8 mEq/l : Last magnesium: Mg 2.1       []  [x]  Potassium level is less than 4 mEq/l : Last potassium:  Lab Results  Component Value Date   K 3.9 07/04/2015      K=4 on 12/07/15   []  [x]  Patient is known or suspected to have a digoxin level greater than 2 ng/ml: No results found for: DIGOXIN    []  []  Creatinine clearance less than 20 ml/min (calculated using Cockcroft-Gault, actual body weight and serum creatinine): CrCl cannot be calculated (Patient has no serum creatinine result on file.).    []  [x]  Patient has received drugs known to prolong the QT intervals within the last 48 hours (phenothiazines, tricyclics or tetracyclic antidepressants, erythromycin, H-1 antihistamines, cisapride, fluoroquinolones, azithromycin). Drugs not listed above may have an, as yet, undetected potential to prolong the QT interval, updated information on QT prolonging agents is available at this website:QT prolonging agents   []  [x]  Patient received a dose of hydrochlorothiazide (Oretic) alone or in any combination including triamterene (Dyazide, Maxzide) in the last 48 hours.   []  [x]  Patient received a medication known to increase dofetilide plasma concentrations prior to initial  dofetilide dose:  . Trimethoprim (Primsol, Proloprim) in the last 36 hours . Verapamil (Calan, Verelan) in the last 36 hours or a sustained release dose in the last 72 hours . Megestrol (Megace) in the last 5 days  . Cimetidine (Tagamet) in the last 6 hours . Ketoconazole (Nizoral) in the last 24 hours . Itraconazole (Sporanox) in the last 48 hours  . Prochlorperazine (Compazine) in the last 36 hours    []  [x]  Patient is known to have a history of torsades de pointes; congenital or acquired long QT syndromes.   []  [x]  Patient has received a Class 1 antiarrhythmic with less than 2 half-lives since last dose. (Disopyramide, Quinidine, Procainamide, Lidocaine, Mexiletine, Flecainide, Propafenone)   []  [x]  Patient has received amiodarone therapy in the past 3 months or amiodarone level is greater than 0.3 ng/ml.    Patient has been appropriately anticoagulated with Xarelto.  Ordering provider was confirmed at [email or web address] if they are not listed on the [facility] Authorized Prescribers list.  Goal of Therapy: Follow renal function, electrolytes, potential drug interactions, and dose adjustment. Provide education and 1 week supply at discharge.  Plan:  [x]   Physician selected initial dose within range recommended for patients level of renal function - will monitor for response.  []   Physician selected initial dose outside of range recommended for patients level of renal function - will discuss if the dose should be altered at this time.   Select One Calculated CrCl  Dose q12h  [x]  > 60 ml/min 500 mcg  []  40-60 ml/min 250 mcg  []  20-40 ml/min 125 mcg  2. Follow up QTc after the first 5 doses, renal function, electrolytes (K & Mg) daily x 3     days, dose adjustment, success of initiation and facilitate 1 week discharge supply as     clinically indicated.  3. Initiate Tikosyn education video (Call [phone] and ask for video # 116).  4. Place Enrollment Form on the chart for discharge  supply of dofetilide.   Patient extremely apprehensive about taking Xarelto because he knew some friend of friends that had an intracradial hemorrhage on some blood thinner. I patiently explained the indication, his yearly risk of stroke, the side effects, risks, etc. Tried to answer some questions for him about how poorly he feels on beta blockers and how BB and ACE/ARB would be beneficial for him long-term.     Jonna Dittrich S. Merilynn Finland, PharmD, BCPS Clinical Staff Pharmacist Pager [phone]  Avaiyah Strubel S. Merilynn Finland, PharmD, BCPS Clinical Staff Pharmacist Pager [phone]  Misty Stanley Stillinger 12:12 PM 12/07/2015

## 2015-12-08 ENCOUNTER — Inpatient Hospital Stay (HOSPITAL_COMMUNITY): Payer: BLUE CROSS/BLUE SHIELD

## 2015-12-08 DIAGNOSIS — I480 Paroxysmal atrial fibrillation: Principal | ICD-10-CM

## 2015-12-08 LAB — LIPID PANEL
CHOL/HDL RATIO: 4.5 ratio
Cholesterol: 166 mg/dL (ref 0–200)
HDL: 37 mg/dL — AB (ref >40)
LDL CALC: 111 mg/dL — AB (ref 0–99)
TRIGLYCERIDES: 89 mg/dL (ref <150)
VLDL: 18 mg/dL (ref 0–40)

## 2015-12-08 LAB — BASIC METABOLIC PANEL
ANION GAP: 8 (ref 5–15)
BUN: 11 mg/dL (ref 6–20)
CHLORIDE: 103 mmol/L (ref 101–111)
CO2: 30 mmol/L (ref 22–32)
Calcium: 9 mg/dL (ref 8.9–10.3)
Creatinine, Ser: 1.11 mg/dL (ref 0.61–1.24)
GLUCOSE: 95 mg/dL (ref 65–99)
POTASSIUM: 3.8 mmol/L (ref 3.5–5.1)
Sodium: 141 mmol/L (ref 135–145)

## 2015-12-08 LAB — MAGNESIUM: Magnesium: 2.2 mg/dL (ref 1.7–2.4)

## 2015-12-08 MED ORDER — POTASSIUM CHLORIDE CRYS ER 20 MEQ PO TBCR
40.0000 meq | EXTENDED_RELEASE_TABLET | Freq: Once | ORAL | Status: AC
  Administered 2015-12-08: 40 meq via ORAL
  Filled 2015-12-08: qty 2

## 2015-12-08 NOTE — Care Management Note (Signed)
Case Management Note [name], [name] Unit 2W-Case Manager [phone]   Patient Details  Name: Todd [name] MRN: [id number] Date of Birth: February 26, 1966  Subjective/Objective:    Pt admitted with Afib for Tikosyn load                Action/Plan: PTA pt lived at home with spouse-independent- plan to return home - referral received for Tikosyn benefit check- per pt he has drug coverage with [employer]- does not have card with him- call made to [facility] for ID info- member ID is [phone], [employer] Fines [phone]- will submit Insurance benefit check for both Tikosyn and Xarelto, Per [employer] they are in [employer] and can order drug when script received, they also have Xarelto available. Pt will need 7 day supply of Tikosyn from [facility] main pharmacy at time of discharge- MD please provide- 2 scripts - one with no refills for 7 day supply to send to [facility], and the other with refills for pt to take to [facility]. Will f/u with pt regarding benefits once known.   Expected Discharge Date:                  Expected Discharge Plan:  Home/Self Care  In-House Referral:     Discharge planning Services  CM Consult, Medication Assistance  Post Acute Care Choice:  NA Choice offered to:  NA  DME Arranged:  N/A DME Agency:  NA  HH Arranged:  NA HH Agency:  NA  Status of Service:  In process, will continue to follow  Medicare Important Message Given:    Date Medicare IM Given:    Medicare IM give by:    Date Additional Medicare IM Given:    Additional Medicare Important Message give by:     If discussed at Long Length of Stay Meetings, dates discussed:    Additional Comments:  Darrold Span, RN 12/08/2015, 2:59 PM

## 2015-12-08 NOTE — Consult Note (Signed)
ELECTROPHYSIOLOGY CONSULT NOTE    Patient ID: Todd [name] MRN: [id number], DOB/AGE: January 22, 1966 50 y.o.  Admit date: 12/07/2015 Date of Consult: 12/08/2015  Primary Physician: Todd Clan, MD Primary Cardiologist: Todd [name] Requesting Physician: Todd [name]  Reason for Consultation: atrial fibrillation  HPI:  Todd [name] is a 50 y.o. male with a past medical history significant for NICM, paroxysmal atrial fibrillation, hypertension, hyperlipidemia, OSA on CPAP, morbid obesity who was admitted 12/08/15 for planned Tikosyn load.   He developed atypical chest pain during the summer of 2016 and underwent evaluation demonstrating NICM.  He reports that he has had palpitations for years but these worsened recently and an event monitor was placed which demonstrated atrial fibrillation.  He reports fatigue and exercise intolerance with BB.  He also has CPAP at home but that doesn't fit well and he is going to call to have new mask fitted.   Echo 08/2015 demonstrated EF 29%, moderate concentric hypertrophy, LA moderately dilated, mild MR Cath 06/2015 demonstrated EF 25-30%, normal coronaries  Past Medical History  Diagnosis Date  . GERD (gastroesophageal reflux disease)   . HLD (hyperlipidemia)   . OSA on CPAP   . Gout   . PVC (premature ventricular contraction)   . Dizziness   . Atypical chest pain   . IFG (impaired fasting glucose)   . Obesity   . Hypertension   . CHF (congestive heart failure) (HCC)   . Shortness of breath dyspnea      Surgical History:  Past Surgical History  Procedure Laterality Date  . Carpal tunnel release    . Cardiac catheterization N/A 07/01/2015    Procedure: Left Heart Cath;  Surgeon: Yates Decamp, MD;  Location: [facility] INVASIVE CV LAB;  Service: Cardiovascular;  Laterality: N/A;     Prescriptions prior to admission  Medication Sig Dispense Refill Last Dose  . acetaminophen (TYLENOL) 500 MG tablet Take 1,000 mg by mouth daily as needed for headache.   couple days  ago  . carvedilol (COREG) 6.25 MG tablet Take 6.25 mg by mouth daily at 3 pm. Take with small snack   12/06/2015 at 1600  . Cholecalciferol (VITAMIN D) 2000 units tablet Take 2,000 Units by mouth daily.   12/05/2015  . lisinopril (PRINIVIL,ZESTRIL) 20 MG tablet Take 20 mg by mouth daily.   12/06/2015 at Unknown time  . olmesartan (BENICAR) 40 MG tablet Take 40 mg by mouth at bedtime.   12/06/2015 at Unknown time    Inpatient Medications:  . atorvastatin  10 mg Oral q1800  . carvedilol  6.25 mg Oral BID WC  . dofetilide  500 mcg Oral Q12H  . lisinopril  10 mg Oral Daily  . potassium chloride  40 mEq Oral Once  . rivaroxaban  20 mg Oral Q supper  . sodium chloride  3 mL Intravenous Q12H  . sodium chloride  3 mL Intravenous Q12H    Allergies:  Allergies  Allergen Reactions  . Metoprolol Other (See Comments)    Lethargy, didn't feel like doing anything (tolerates low dose carvedilol)  . Valsartan Other (See Comments)    Increased blood pressure  . Azithromycin Itching and Other (See Comments)    Tingling in fingers  . Entresto [Sacubitril-Valsartan] Rash and Other (See Comments)    Tingling in fingers/ redness & sensitivity at tips of fingers    Social History   Social History  . Marital Status: Married    Spouse Name: Todd [name]  . Number of Children: 1  . Years of  Education: HS   Occupational History  . [employer]    Social History Main Topics  . Smoking status: Never Smoker   . Smokeless tobacco: Never Used  . Alcohol Use: 0.6 oz/week    0 Standard drinks or equivalent, 1 Glasses of wine per week     Comment: Rare occasion  . Drug Use: No  . Sexual Activity: Yes   Other Topics Concern  . Not on file   Social History Narrative   Drinks 1 Mt Dew 2 days a week      Family History  Problem Relation Age of Onset  . Seizures Mother   . Deep vein thrombosis Mother   . COPD Mother   . Heart failure Mother   . Lung cancer Mother   . Prostate cancer Father   . Hypertension Father    . Diabetes Father   . Heart attack Neg Hx   . Stroke Paternal Grandfather      Review of Systems: All other systems reviewed and are otherwise negative except as noted above.  Physical Exam: Filed Vitals:   12/07/15 1209 12/07/15 2107 12/08/15 0535  BP: 132/91 137/98 137/96  Pulse: 66 68 62  Temp: 98.6 F (37 C) 98.6 F (37 C) 98 F (36.7 C)  TempSrc: Oral Oral Oral  Resp: 18 18 18   Height: 5\' 6"  (1.676 m)    Weight: 231 lb (104.781 kg)    SpO2: 97% 98% 97%    GEN- The patient is obese appearing, alert and oriented x 3 today.   HEENT: normocephalic, atraumatic; sclera clear, conjunctiva pink; hearing intact; oropharynx clear; neck supple  Lungs- Clear to ausculation bilaterally, normal work of breathing.  No wheezes, rales, rhonchi Heart- Regular rate and rhythm  GI- obese, non-tender, non-distended, bowel sounds present  Extremities- no clubbing, cyanosis, or edema; DP/PT/radial pulses 2+ bilaterally MS- no significant deformity or atrophy Skin- warm and dry, no rash or lesion Psych- euthymic mood, full affect Neuro- strength and sensation are intact  Labs:   Lab Results  Component Value Date   WBC 6.9 07/04/2015   HGB 14.5 07/04/2015   HCT 43.9 07/04/2015   MCV 81.3 07/04/2015   PLT 204 07/04/2015    Recent Labs Lab 12/07/15 1330 12/08/15 0352  NA 140 141  K 4.0 3.8  CL 104 103  CO2 28 30  BUN 12 11  CREATININE 0.93 1.11  CALCIUM 9.0 9.0  PROT 6.6  --   BILITOT 0.8  --   ALKPHOS 47  --   ALT 28  --   AST 19  --   GLUCOSE 103* 95      Radiology/Studies: No results found.  EKG: sinus rhythm, rate 65, QTc stable  TELEMETRY: sinus rhythm, PVC's, PAC's, short run AF  Assessment/Plan: 1.  Paroxysmal atrial fibrillation The patient has paroxysmal atrial fibrillation and has been started on tikosyn Agree that controlling AF Todd [name] likely have positive impact on cardiomyopathy QTc stable Keep K >3.9, Mg>1.8  2.  NICM Continue BB/ACE-I and  uptitrate as able If EF not improved with optimal medical therapy, may need to consider primary prevention ICD  While the data for NICM patients is not as robust, those data were applied to patients over the age of 50.  Could follow up as an outpatient to discuss further Repeat echo pending this admission.   3.  Obesity Continued lifestyle modification Chanteria Haggard be necessary for long term outcomes Discussed CardioFit data with patient today Weight loss encouraged  4.  OSA Compliance encouraged The patient is going to call for new mask  Electrophysiology team to see as needed while here. Please call with questions.   Signed, Gypsy Balsam, NP 12/08/2015 12:06 PM  I have seen and examined this patient with Gypsy Balsam.  Agree with above, note added to reflect my findings.  On exam, regular rhythm, no murmurs, lungs clear.  Diagnosed with HF over the summer as well as AF.  Currently admitted for tikosyn loading.  Agree with the plan of tikosyn.  He would be a candidate for ICD with his history of CHF.  Would follow up as outpatient to discuss further if he wishes this to be put in and if his EF is still low after 3 months of optimal therapy.  Ciro Tashiro M. Elio Haden MD 12/08/2015 4:25 PM

## 2015-12-08 NOTE — Progress Notes (Signed)
Patient has home CPAP machine and nasal pillows. Patient states he will place himself on when ready.

## 2015-12-08 NOTE — Progress Notes (Signed)
Subjective:  States no further palpitations since being on the Tikosyn. Feels weak, but has not slept in 4 days due to working at nights.  Objective:  Vital Signs in the last 24 hours: Temp:  [98 F (36.7 C)-98.6 F (37 C)] 98 F (36.7 C) (01/09 0535) Pulse Rate:  [62-68] 62 (01/09 0535) Resp:  [18] 18 (01/09 0535) BP: (132-137)/(91-98) 137/96 mmHg (01/09 0535) SpO2:  [97 %-98 %] 97 % (01/09 0535) Weight:  [104.781 kg (231 lb)] 104.781 kg (231 lb) (01/08 1209)  Intake/Output from previous day: 01/08 0701 - 01/09 0700 In: 480 [P.O.:480] Out: 250 [Urine:250]  Physical Exam:  General appearance: alert, cooperative, appears stated age and no distress Eyes: negative findings: lids and lashes normal Neck: no adenopathy, no carotid bruit, no JVD, supple, symmetrical, trachea midline and thyroid not enlarged, symmetric, no tenderness/mass/nodules Neck: JVP - normal, carotids 2+= without bruits Resp: clear to auscultation bilaterally Chest wall: no tenderness Cardio: regular rate and rhythm, S1, S2 normal, no murmur, click, rub or gallop GI: soft, non-tender; bowel sounds normal; no masses,  no organomegaly Extremities: extremities normal, atraumatic, no cyanosis or edema    Lab Results: BMP  Recent Labs  07/04/15 2244 12/07/15 1330 12/08/15 0352  NA 139 140 141  K 3.9 4.0 3.8  CL 103 104 103  CO2 GLUCOSE 99 103* 95  BUN CREATININE 1.11 0.93 1.11  CALCIUM 9.0 9.0 9.0  GFRNONAA >60 >60 >60  GFRAA >60 >60 >60    CBC No results for input(s): WBC, RBC, HGB, HCT, PLT, MCV, MCH, MCHC, RDW, LYMPHSABS, MONOABS, EOSABS, BASOSABS in the last 168 hours.  Invalid input(s): NEUTRABS  HEMOGLOBIN A1C No results found for: HGBA1C, MPG  Cardiac Panel (last 3 results)  Recent Labs  07/04/15 2244  TROPONINI 0.04*    BNP (last 3 results) No results for input(s): PROBNP in the last 8760 hours.  TSH No results for input(s): TSH in the last 8760  hours.  CHOLESTEROL  Recent Labs  12/08/15 0352  CHOL 166    Hepatic Function Panel  Recent Labs  12/07/15 1330  PROT 6.6  ALBUMIN 3.7  AST 19  ALT 28  ALKPHOS 47  BILITOT 0.8    Cardiac Studies:  EKG 12/08/2015: NSR, IRBBB. LVH with repolarizatino abnormality with non specific T change in lateral leads. Normal QT. No change from EKG 12/07/2015: Normal sinus rhythm at rate of 63 bpm, normal axis, IRBBB nonspecific lateral T abnormality. No significant change from prior EKG on 07/05/2015. Normal QT interval.  Coronary angiogram 07/01/2015: Normal coronary arteries. LVEF 25-35%   Echocardiogram 09/02/2015: Left ventricle cavity is normal in size. Moderate concentric hypertrophy of the left ventricle. Severe decrease in LV systolic function. Normal diastolic filling pattern. Left ventricle regional wall motion findings: No wall motion abnormalities. Calculated EF 29%. Left atrial cavity is moderately dilated. Mild mitral regurgitation. Trace tricuspid regurgitation. No evidence of pulmonary hypertension. No significant change from Echo 04/29/2015.  Assessment/Plan:  1. Paroxysmal atrial fibrillation, symptomatic, last episode 12/06/2015 on event monitoring. 2. Nonischemic dilated cardiomyopathy with severe LV systolic dysfunction, EF 25-30%. 3. Chronic systolic and diastolic heart failure 4. Hypertension 5. Hyperlipidemia 6. Severe obstructive sleep apnea presently on CPAP 7. Moderate obesity 8. Hyperglycemia  Recommendation: Echo pending. Doing well and maintains sinus with normal QT. Dose 3 of Tikosyn. Will have EP see him today. Since being on Tikosyn, PVC have also lessened. Patient with multiple medication intolerances and  difficult to manage with multiple phone calls in the OP basis. Has had second opinion from Donato Schultz, MD also.   Yates Decamp, M.D. 12/08/2015, 10:21 AM [facility] Pager: [phone] Office: [phone] If no answer:  [phone]

## 2015-12-08 NOTE — Progress Notes (Signed)
Utilization review completed.  

## 2015-12-08 NOTE — Progress Notes (Signed)
  Echocardiogram 2D Echocardiogram has been performed.  Todd [name] 12/08/2015, 10:11 AM

## 2015-12-08 NOTE — Progress Notes (Signed)
Pt's QTc was 520 msec, MD notified, new order received.

## 2015-12-08 NOTE — Progress Notes (Signed)
Pt rested well through the night with episodes of bradycardia and one pause.

## 2015-12-09 LAB — MAGNESIUM: Magnesium: 2.1 mg/dL (ref 1.7–2.4)

## 2015-12-09 LAB — BASIC METABOLIC PANEL
Anion gap: 8 (ref 5–15)
BUN: 13 mg/dL (ref 6–20)
CHLORIDE: 103 mmol/L (ref 101–111)
CO2: 27 mmol/L (ref 22–32)
Calcium: 8.8 mg/dL — ABNORMAL LOW (ref 8.9–10.3)
Creatinine, Ser: 1.13 mg/dL (ref 0.61–1.24)
GFR calc Af Amer: >60 mL/min (ref >60)
GFR calc non Af Amer: >60 mL/min (ref >60)
GLUCOSE: 93 mg/dL (ref 65–99)
POTASSIUM: 3.9 mmol/L (ref 3.5–5.1)
Sodium: 138 mmol/L (ref 135–145)

## 2015-12-09 MED FILL — Perflutren Lipid Microsphere IV Susp 1.1 MG/ML: INTRAVENOUS | Qty: 10 | Status: AC

## 2015-12-09 NOTE — Progress Notes (Addendum)
Insurance benefit check completed for both Tikosyn and Xarelto-  Copay for brand for tikosyn $533.36-generic $421.63- Copay for Xarelto $50- prior auth not required   Pt has been given assistance cards for both drugs.

## 2015-12-09 NOTE — Care Management Note (Signed)
Case Management Note [name], [name] Unit 2W-Case Manager [phone]   Patient Details  Name: Todd [name] MRN: [id number] Date of Birth: [birth year]  Subjective/Objective:    Pt admitted with Afib for Tikosyn load                Action/Plan: PTA pt lived at home with spouse-independent- plan to return home - referral received for Tikosyn benefit check- per pt he has drug coverage with [employer]- does not have card with him- call made to [facility] for ID info- member ID is [id number], [name]- will submit Insurance benefit check for both Tikosyn and Xarelto, Per [employer] they are in [employer] and can order drug when script received, they also have Xarelto available. Pt will need 7 day supply of Tikosyn from [facility] main pharmacy at time of discharge- MD please provide- 2 scripts - one with no refills for 7 day supply to send to [facility], and the other with refills for pt to take to [facility]. Will f/u with pt regarding benefits once known.   Expected Discharge Date:     12/10/15             Expected Discharge Plan:  Home/Self Care  In-House Referral:     Discharge planning Services  CM Consult, Medication Assistance  Post Acute Care Choice:  NA Choice offered to:  NA  DME Arranged:  N/A DME Agency:  NA  HH Arranged:  NA HH Agency:  NA  Status of Service:  Completed, signed off  Medicare Important Message Given:    Date Medicare IM Given:    Medicare IM give by:    Date Additional Medicare IM Given:    Additional Medicare Important Message give by:     If discussed at Long Length of Stay Meetings, dates discussed:    Additional Comments:  12/09/15- 1130- [name], [name] - insurance check completed on [name] and Xarelto- Copay for brand for tikosyn $533.36-generic $421.63-Copay for Xarelto $50-prior auth not requiredUnitedHealth with pt at bedside and shared coverage benefits- pt has concerns  regarding high copay cost- provided pt with Xarelto 30 day free card and $0 copay assist card (good for 12 mo) also provided pt with Tikosyn copay assist card for $4 (good for 12 mo) this would assist pt for a year with cost of drugs - but pt unsure what he will do after this year- will need to have a conversation with MD regarding a plan for next year as pt states that he will not be able to afford Tikosyn if cost goes up to his insurance copay. - Pt's pharmacy -[facility] can order to be in stock once prescription received. Pt will need 7 day supply from [facility] prior to discharge.    Darrold Span, RN 12/09/2015, 11:32 AM

## 2015-12-09 NOTE — Progress Notes (Addendum)
Subjective:  No specific complaints.   Objective:  Vital Signs in the last 24 hours: Temp:  [98.2 F (36.8 C)-99.1 F (37.3 C)] 99.1 F (37.3 C) (01/10 1323) Pulse Rate:  [61-77] 77 (01/10 1323) Resp:  [18] 18 (01/10 1323) BP: (119-136)/(76-98) 128/98 mmHg (01/10 1323) SpO2:  [96 %-97 %] 97 % (01/10 1323) Weight:  [104 kg (229 lb 4.5 oz)] 104 kg (229 lb 4.5 oz) (01/10 0620)  Intake/Output from previous day: 01/09 0701 - 01/10 0700 In: 1380 [P.O.:1380] Out: 2300 [Urine:2300]  Physical Exam:  General appearance: alert, cooperative, appears stated age and no distress Eyes: negative findings: lids and lashes normal Neck: no adenopathy, no carotid bruit, no JVD, supple, symmetrical, trachea midline and thyroid not enlarged, symmetric, no tenderness/mass/nodules Neck: JVP - normal, carotids 2+= without bruits Resp: clear to auscultation bilaterally Chest wall: no tenderness Cardio: regular rate and rhythm, S1, S2 normal, no murmur, click, rub or gallop GI: soft, non-tender; bowel sounds normal; no masses,  no organomegaly Extremities: extremities normal, atraumatic, no cyanosis or edema  Lab Results: BMP  Recent Labs  12/07/15 1330 12/08/15 0352 12/09/15 0210  NA 140 141 138  K 4.0 3.8 3.9  CL 104 103 103  CO2 28 30 27   GLUCOSE 103* 95 93  BUN 12 11 13   CREATININE 0.93 1.11 1.13  CALCIUM 9.0 9.0 8.8*  GFRNONAA >60 >60 >60  GFRAA >60 >60 >60   BNP (last 3 results)  Recent Labs  12/07/15 1330  BNP 148.2*     Cardiac Studies:  Telemetry 12/09/2015: Brief 8 beat atrial tachycardia. No further sustained A. Fib. Occasional PVC, multifocal.  EKG 12/08/2015: NSR, IRBBB. LVH with repolarizatino abnormality with non specific T change in lateral leads. Normal QT. No change from EKG 12/07/2015: Normal sinus rhythm at rate of 63 bpm, normal axis, IRBBB nonspecific lateral T abnormality. No significant change from prior EKG on 07/05/2015. Normal QT interval.  Coronary  angiogram 07/01/2015: Normal coronary arteries. LVEF 25-35%   Echocardiogram 12/08/2015: LVEF 25-30%, dilated LV with grade II diastolic dysfunction. Mild LVH. No significant change from  Echo 09/02/2015: Left ventricle cavity is normal in size. Moderate concentric hypertrophy of the left ventricle. Severe decrease in LV systolic function. Normal diastolic filling pattern. Left ventricle regional wall motion findings: No wall motion abnormalities. Calculated EF 29%. Left atrial cavity is moderately dilated. Mild mitral regurgitation. Trace tricuspid regurgitation. No evidence of pulmonary hypertension. No significant change from Echo 04/29/2015.  Assessment/Plan:  1. Paroxysmal atrial fibrillation, symptomatic, last episode 12/06/2015 on event monitoring. CHA2DS2-VASCScore: Risk Score  2,  Yearly risk of stroke  2.2%. Recommendation: ASA No/Anticoagulation Yes  2. Nonischemic dilated cardiomyopathy with severe LV systolic dysfunction, EF 25-30%. 3. Chronic systolic and diastolic heart failure 4. Hypertension 5. Hyperlipidemia 6. Severe obstructive sleep apnea presently on CPAP 7. Moderate obesity 8. Hyperglycemia  Recommendation:  Doing well and maintains sinus with normal QT. Dose 4 of Tikosyn. He will receive 5th dose tonight and should be able to discharge tomorrow morning after 6th dose.  He is not on optimal medical therapy due to multiple medication intolerances. He is now willing to do his best.  BNP fairly normal and not in acute decompensated CHF. He does not want ICD at this time and wants to continue medical therapy for now.   Yates Decamp, M.D. 12/09/2015, 7:59 PM [facility] Pager: [phone] Office: [phone] If no answer: [phone]

## 2015-12-10 LAB — BASIC METABOLIC PANEL
Anion gap: 7 (ref 5–15)
BUN: 12 mg/dL (ref 6–20)
CHLORIDE: 104 mmol/L (ref 101–111)
CO2: 29 mmol/L (ref 22–32)
Calcium: 8.8 mg/dL — ABNORMAL LOW (ref 8.9–10.3)
Creatinine, Ser: 1.02 mg/dL (ref 0.61–1.24)
GFR calc non Af Amer: >60 mL/min (ref >60)
Glucose, Bld: 95 mg/dL (ref 65–99)
POTASSIUM: 3.6 mmol/L (ref 3.5–5.1)
SODIUM: 140 mmol/L (ref 135–145)

## 2015-12-10 LAB — CBC
HEMATOCRIT: 43.4 % (ref 39.0–52.0)
HEMOGLOBIN: 14.4 g/dL (ref 13.0–17.0)
MCH: 27.1 pg (ref 26.0–34.0)
MCHC: 33.2 g/dL (ref 30.0–36.0)
MCV: 81.7 fL (ref 78.0–100.0)
Platelets: 176 10*3/uL (ref 150–400)
RBC: 5.31 MIL/uL (ref 4.22–5.81)
RDW: 14.6 % (ref 11.5–15.5)
WBC: 8 10*3/uL (ref 4.0–10.5)

## 2015-12-10 LAB — MAGNESIUM: MAGNESIUM: 2.2 mg/dL (ref 1.7–2.4)

## 2015-12-10 MED ORDER — RIVAROXABAN 20 MG PO TABS: 20.0000 mg | ORAL_TABLET | Freq: Every day | ORAL | Status: DC

## 2015-12-10 MED ORDER — POTASSIUM CHLORIDE CRYS ER 20 MEQ PO TBCR
40.0000 meq | EXTENDED_RELEASE_TABLET | Freq: Once | ORAL | Status: AC
  Administered 2015-12-10: 40 meq via ORAL
  Filled 2015-12-10: qty 2

## 2015-12-10 MED ORDER — CARVEDILOL 6.25 MG PO TABS: 6.2500 mg | ORAL_TABLET | Freq: Two times a day (BID) | ORAL | Status: DC

## 2015-12-10 MED ORDER — POTASSIUM CHLORIDE ER 10 MEQ PO TBCR: 10.0000 meq | EXTENDED_RELEASE_TABLET | Freq: Two times a day (BID) | ORAL | Status: DC

## 2015-12-10 MED ORDER — DOFETILIDE 500 MCG PO CAPS: 500.0000 ug | ORAL_CAPSULE | Freq: Two times a day (BID) | ORAL | Status: DC

## 2015-12-10 MED ORDER — ATORVASTATIN CALCIUM 20 MG PO TABS: 20.0000 mg | ORAL_TABLET | Freq: Every day | ORAL | Status: DC

## 2015-12-10 NOTE — Care Management Note (Signed)
Case Management Note Kourtlynn Trevor RN, [name] Unit 2W-Case Manager [phone]   Patient Details  Name: Todd [name] MRN: [id number] Date of Birth: Dec 17, 1965  Subjective/Objective:    Pt admittedDonn Pierinifor Tikosyn load                Action/Plan: PTA pt lived at home with spouse-independent- plan to return home - referral received for Tikosyn benefit check- per pt he has drug coverage with [employer]- does not have card with him- call made to [facility] for ID info- member ID is [phone] [phone], [employer] [id number]- will submit Insurance benefit check for both Tikosyn and Xarelto, Per [employer] they are in [employer] and can order drug when script received, they also have Xarelto available. Pt will need 7 day supply of Tikosyn from [facility] main pharmacy at time of discharge- MD please provide- 2 scripts - one with no refills for 7 day supply to send to [facility], and the other with refills for pt to take to [facility]. Will f/u with pt regarding benefits once known.   Expected Discharge Date:     12/10/15             Expected Discharge Plan:  Home/Self Care  In-House Referral:     Discharge planning Services  CM Consult, Medication Assistance  Post Acute Care Choice:  NA Choice offered to:  NA  DME Arranged:  N/A DME Agency:  NA  HH Arranged:  NA HH Agency:  NA  Status of Service:  Completed, signed off  Medicare Important Message Given:    Date Medicare IM Given:    Medicare IM give by:    Date Additional Medicare IM Given:    Additional Medicare Important Message give by:     If discussed at Long Length of Stay Meetings, dates discussed:  Discharge Disposition: Home/Self care     Additional Comments:  12/10/15- 1045- [name], [name] - f/u done with pt for d/c needs- call made to NP for paper scripts for both Tikosyn and Xarelto other scripts have been sent electronically to [facility]- paper script taken to [facility] main  pharmacy for 7 day supply of Tikosyn for discharge- and paper script given to pt for Xarelto to use with 30 day supply. Pt did call Tikosyn support and was told that he would be able to get another copay card for 2nd year. Pt will continue to follow with his MD for further needs. Beside RN aware to pick Tikosyn up when ready from Main Pharmacy for discharge.  12/09/15- 1130- [name], [name] - insurance check completed on [employer]- Copay for brand for [employer] $533.36-generic $421.63-Copay for Xarelto $50-prior auth not requiredUnitedHealth with pt at bedside and shared coverage benefits- pt has concerns regarding high copay cost- provided pt with Xarelto 30 day free card and $0 copay assist card (good for 12 mo) also provided pt with Tikosyn copay assist card for $4 (good for 12 mo) this would assist pt for a year with cost of drugs - but pt unsure what he will do after this year- will need to have a conversation with MD regarding a plan for next year as pt states that he will not be able to afford Tikosyn if cost goes up to his insurance copay. - Pt's pharmacy -[facility] can order to be in stock once prescription received. Pt will need 7 day supply from [facility] prior to discharge.    Darrold Span, RN 12/10/2015, 10:52 AM

## 2015-12-10 NOTE — Progress Notes (Signed)
I reviewed discharge instructions with pt and spouse. They both verbalized understanding. PIV removed and telemetry discontinued. Pt discharged to home at1320, escorted by spouse.

## 2015-12-10 NOTE — Progress Notes (Addendum)
MD Brackbill notified this AM about pt's HR dropping, as low as 39, periodically as well as having a few runs of ventricular bigeminy.  Pt asymptomatic.  New orders received.  Will cont to monitor pt closely.  Erenest Rasher, RN

## 2015-12-10 NOTE — Progress Notes (Signed)
Patient has home CPAP unit and nasal pillows. Patient states he will place himself on CPAP when ready.

## 2015-12-10 NOTE — Discharge Summary (Signed)
Physician Discharge Summary  Patient ID: Todd [name] MRN: [id number] DOB/AGE: Jan 16, 1966 50 y.o.  Admit date: 12/07/2015 Discharge date: 12/10/2015  Discharge Diagnoses: 1. Paroxysmal atrial fibrillation, symptomatic, last episode 12/06/2015 on event monitoring. CHA2DS2-VASCScore: Risk Score 2, Yearly risk of stroke 2.2%. Recommendation: ASA No/Anticoagulation Yes 2. Nonischemic dilated cardiomyopathy with severe LV systolic dysfunction, EF 25-30%. 3. Chronic systolic and diastolic heart failure 4. Hypertension 5. Hyperlipidemia 6. Severe obstructive sleep apnea presently on CPAP 7. Moderate obesity 8. Hyperglycemia  Diagnostic Studies: Echocardiogram 12/08/2015:  Left ventricle: The cavity size was mildly dilated. There wasmild concentric hypertrophy. Systolic function was severelyreduced. The estimated ejection fraction was in the range of 20%to 25%. Diffuse hypokinesis. Features are consistent with apseudonormal left ventricular filling pattern, with concomitantabnormal relaxation and increased filling pressure (grade 2diastolic dysfunction). Doppler parameters are consistent withboth elevated ventricular end-diastolic filling pressure andelevated left atrial filling pressure. Mitral valve: There was mild to moderate regurgitation.  Hospital Course:  Patient with nonischemic dilated cardiomyopathy, echocardiogram on 04/29/2015 revealing LVEF 30%, due to abnormal nuclear stress test on 05/12/2015 revealing inferior wall scar with mild ischemia and LVEF 19%, underwent angiography on 07/01/2015 revealing no significant coronary artery disease. He has multiple medication intolerances. He had been c/o palpitations recently and outpatient event monitor revealed A. Fib with RVR @ 150/min. Although no symptoms of CHF, due to his cardiomyopathy and his CV risks, felt best option was admission to the hospital for initiation of Tikosyn therapy. BNP fairly normal and not in acute  decompensated CHF. EP consulted.  He does not want ICD at this time and wants to continue medical therapy for now. Previously not on optimal medical therapy due to multiple medication intolerances, however, will make more of an effort to be compliant with recommended medications and dosages.  He received his 6th dose of Tikosyn this morning. No new symptoms or concerns this morning.   Recommendations on discharge: Continue Xarelto for CVA prevention, continue Tikosyn.  Follow up out patient as scheduled for reevaluation.   Discharge Exam: Blood pressure 127/83, pulse 58, temperature 97.6 F (36.4 C), temperature source Oral, resp. rate 18, height 5\' 6"  (1.676 m), weight 103.8 kg (228 lb 13.4 oz), SpO2 98 %.   Constitutional: He is oriented to person, place, and time. He appears well-developed.  Neck: Neck supple.  Cardiovascular: Normal rate, regular rhythm, normal heart sounds and intact distal pulses. Exam reveals no gallop.  No murmur heard. Respiratory: Effort normal and breath sounds normal.  GI: Soft. Bowel sounds are normal.  Musculoskeletal: Normal range of motion. He exhibits no edema.  Neurological: He is alert and oriented to person, place, and time.  Skin: Skin is warm and dry.   Labs:   Lab Results  Component Value Date   WBC 8.0 12/10/2015   HGB 14.4 12/10/2015   HCT 43.4 12/10/2015   MCV 81.7 12/10/2015   PLT 176 12/10/2015    Recent Labs Lab 12/07/15 1330  12/10/15 0234  NA 140  < > 140  K 4.0  < > 3.6  CL 104  < > 104  CO2 28  < > 29  BUN 12  < > 12  CREATININE 0.93  < > 1.02  CALCIUM 9.0  < > 8.8*  PROT 6.6  --   --   BILITOT 0.8  --   --   ALKPHOS 47  --   --   ALT 28  --   --   AST 19  --   --  GLUCOSE 103*  < > 95  < > = values in this interval not displayed.  Lipid Panel     Component Value Date/Time   CHOL 166 12/08/2015 0352   TRIG 89 12/08/2015 0352   HDL 37* 12/08/2015 0352   CHOLHDL 4.5 12/08/2015 0352   VLDL 18 12/08/2015 0352    LDLCALC 111* 12/08/2015 0352    BNP (last 3 results)  Recent Labs  12/07/15 1330  BNP 148.2*    HEMOGLOBIN A1C No results found for: HGBA1C, MPG  Cardiac Panel (last 3 results)  Recent Labs  07/04/15 2244  TROPONINI 0.04*    Lab Results  Component Value Date   TROPONINI 0.04* 07/04/2015     TSH No results for input(s): TSH in the last 8760 hours.  EKG 12/07/2015: Normal sinus rhythm at rate of 63 bpm, normal axis, IRBBB nonspecific lateral T abnormality. No significant change from prior EKG on 07/05/2015. Normal QT interval.  Radiology: No results found.    FOLLOW UP PLANS AND APPOINTMENTS  Follow-up Information    Follow up with Yates Decamp, MD.   Specialty:  Cardiology   Why:  To be seen in two weeks   Contact information:   [address] [place] [address] [phone]         Medication List    TAKE these medications        acetaminophen 500 MG tablet  Commonly known as:  TYLENOL  Take 1,000 mg by mouth daily as needed for headache.     atorvastatin 20 MG tablet  Commonly known as:  LIPITOR  Take 1 tablet (20 mg total) by mouth daily at 6 PM.     carvedilol 6.25 MG tablet  Commonly known as:  COREG  Take 1 tablet (6.25 mg total) by mouth 2 (two) times daily with a meal. Take with small snack     dofetilide 500 MCG capsule  Commonly known as:  TIKOSYN  Take 1 capsule (500 mcg total) by mouth every 12 (twelve) hours.     lisinopril 20 MG tablet  Commonly known as:  PRINIVIL,ZESTRIL  Take 20 mg by mouth daily.     olmesartan 40 MG tablet  Commonly known as:  BENICAR  Take 40 mg by mouth at bedtime.     potassium chloride 10 MEQ tablet  Commonly known as:  K-DUR  Take 1 tablet (10 mEq total) by mouth 2 (two) times daily.     rivaroxaban 20 MG Tabs tablet  Commonly known as:  XARELTO  Take 1 tablet (20 mg total) by mouth daily with supper.     Vitamin D 2000 units tablet  Take 2,000 Units by mouth daily.           Erling Conte, NP-C 12/10/2015, 8:46 AM [facility] Pager: [phone] Office: [phone]

## 2015-12-11 ENCOUNTER — Encounter: Payer: Self-pay | Admitting: Cardiology

## 2015-12-23 ENCOUNTER — Ambulatory Visit (INDEPENDENT_AMBULATORY_CARE_PROVIDER_SITE_OTHER): Payer: BLUE CROSS/BLUE SHIELD | Admitting: Cardiology

## 2015-12-23 ENCOUNTER — Encounter: Payer: Self-pay | Admitting: Cardiology

## 2015-12-23 VITALS — BP 138/82 | HR 64 | Ht 66.0 in | Wt 233.0 lb

## 2015-12-23 DIAGNOSIS — I480 Paroxysmal atrial fibrillation: Secondary | ICD-10-CM

## 2015-12-23 NOTE — Progress Notes (Signed)
Electrophysiology Office Note   Date:  12/23/2015   ID:  Todd [name], DOB 07/29/1966, MRN [id number]  PCP:  Martha Clan, MD  Cardiologist:  [name] Primary Electrophysiologist: Jolan Upchurch [name], MD    Chief Complaint  Patient presents with  . Atrial Fibrillation     History of Present Illness: Todd [name] is a 50 y.o. male who presents today for electrophysiology evaluation.   He has a history of systolic HF with an EF of 30%.  He had a cath which showed no CAD.[name]  He also has OSA and is on CPAP, HTN, HLD, morbid obesity, as well as occasional PVCs. He presents today for follow-up of his atrial fibrillation. He was admitted to the hospital for dofetilide and loading earlier this month. He had a 30 day monitor placed, and was found to have an episode of atrial fibrillation early in the morning on January 12. He was sleeping at this time. Since being loaded on the decrease and he has had episodic palpitations that occur at random times during the day without provocation. He says that they last 3-4 seconds. He has been doing better since he was in the hospital, with less shortness of breath and more energy. His palpitations have greatly decreased.  He has bought exercise equipment and is planning to start an exercise regimen to help him lose weight. He has lost 10 pounds recently. He also says that he tries to be compliant with his CPAP, but still takes it off occasionally at night.   Today, he denies symptoms of , chest pain, shortness of breath, orthopnea, PND, lower extremity edema, claudication, dizziness, presyncope, syncope, bleeding, or neurologic sequela. The patient is tolerating medications without difficulties and is otherwise without complaint today.    Past Medical History  Diagnosis Date  . GERD (gastroesophageal reflux disease)   . HLD (hyperlipidemia)   . OSA on CPAP   . Gout   . PVC (premature ventricular contraction)   . Dizziness   . Atypical chest pain   .  IFG (impaired fasting glucose)   . Obesity   . Hypertension   . CHF (congestive heart failure) (HCC)   . Shortness of breath dyspnea    Past Surgical History  Procedure Laterality Date  . Carpal tunnel release    . Cardiac catheterization N/A 07/01/2015    Procedure: Left Heart Cath;  Surgeon: Yates Decamp, MD;  Location: [facility] INVASIVE CV LAB;  Service: Cardiovascular;  Laterality: N/A;     Current Outpatient Prescriptions  Medication Sig Dispense Refill  . acetaminophen (TYLENOL) 500 MG tablet Take 1,000 mg by mouth daily as needed for headache.    [name] atorvastatin (LIPITOR) 20 MG tablet Take 1 tablet (20 mg total) by mouth daily at 6 PM. 30 tablet 2  . carvedilol (COREG) 6.25 MG tablet Take 1 tablet (6.25 mg total) by mouth 2 (two) times daily with a meal. Take with small snack    . Cholecalciferol (VITAMIN D) 2000 units tablet Take 2,000 Units by mouth daily.    [name] dofetilide (TIKOSYN) 500 MCG capsule Take 1 capsule (500 mcg total) by mouth every 12 (twelve) hours. 14 capsule 0  . lisinopril (PRINIVIL,ZESTRIL) 20 MG tablet Take 20 mg by mouth daily.    [name] olmesartan (BENICAR) 40 MG tablet Take 40 mg by mouth at bedtime.    . potassium chloride (K-DUR) 10 MEQ tablet Take 1 tablet (10 mEq total) by mouth 2 (two) times daily. 60 tablet 6  . rivaroxaban (  XARELTO) 20 MG TABS tablet Take 1 tablet (20 mg total) by mouth daily with supper. 30 tablet 0   No current facility-administered medications for this visit.    Allergies:   Metoprolol; Valsartan; Azithromycin; and Entresto   Social History:  The patient  reports that he has never smoked. He has never used smokeless tobacco. He reports that he drinks about 0.6 oz of alcohol per week. He reports that he does not use illicit drugs.   Family History:  The patient's family history includes COPD in his mother; Deep vein thrombosis in his mother; Diabetes in his father; Heart failure in his mother; Hypertension in his father; Lung cancer in his  mother; Prostate cancer in his father; Seizures in his mother; Stroke in his paternal grandfather. There is no history of Heart attack.    ROS:  Please see the history of present illness.   Otherwise, review of systems is positive for DOE, palpitations.   All other systems are reviewed and negative.    PHYSICAL EXAM: VS:  There were no vitals taken for this visit. , BMI There is no weight on file to calculate BMI. GEN: Well nourished, well developed, in no acute distress HEENT: normal Neck: no JVD, carotid bruits, or masses Cardiac: RRR; no murmurs, rubs, or gallops,no edema  Respiratory:  clear to auscultation bilaterally, normal work of breathing GI: soft, nontender, nondistended, + BS MS: no deformity or atrophy Skin: warm and dry Neuro:  Strength and sensation are intact Psych: euthymic mood, full affect  EKG:  EKG is not ordered today. The ekg ordered 1/10 shows sinus rhythm with TWI inferior and lateral  Recent Labs: 12/07/2015: ALT 28; B Natriuretic Peptide 148.2* 12/10/2015: BUN 12; Creatinine, Ser 1.02; Hemoglobin 14.4; Magnesium 2.2; Platelets 176; Potassium 3.6; Sodium 140    Lipid Panel     Component Value Date/Time   CHOL 166 12/08/2015 0352   TRIG 89 12/08/2015 0352   HDL 37* 12/08/2015 0352   CHOLHDL 4.5 12/08/2015 0352   VLDL 18 12/08/2015 0352   LDLCALC 111* 12/08/2015 0352     Wt Readings from Last 3 Encounters:  12/10/15 228 lb 13.4 oz (103.8 kg)  08/25/15 252 lb (114.306 kg)  07/01/15 248 lb (112.492 kg)      Other studies Reviewed: Additional studies/ records that were reviewed today include: 12/08/15 TTE Review of the above records today demonstrates:  - Left ventricle: The cavity size was mildly dilated. There was mild concentric hypertrophy. Systolic function was severely reduced. The estimated ejection fraction was in the range of 20% to 25%. Diffuse hypokinesis. Features are consistent with a pseudonormal left ventricular filling  pattern, with concomitant abnormal relaxation and increased filling pressure (grade 2 diastolic dysfunction). Doppler parameters are consistent with both elevated ventricular end-diastolic filling pressure and elevated left atrial filling pressure. - Mitral valve: There was mild to moderate regurgitation.   ASSESSMENT AND PLAN:  1.  Chronic heart failure due to nonischemic cardiomyopathy: initially discovered in May 2016.  EF of 20-25%.  Has unfortunately been unable to tolerate much in the way of medical therapy for his HF.  He has not been on optimal medical therapy for 3 months, therefore he does not qualify for an ICD placement.  2. Paroxysmal atrial fibrillation: On tikosyn but continuing to have breakthrough as evident from his event monitor.  Breakthrough has been in the early morning.  I have encouraged him to be compliant with his CPAP which may help nighttime episodes.  He  is currently on Xarelto for prevention of CVA.  Has a CHADS2VASc of 2.  Also encouraged weight loss. I have discussed with him the possibility of ablation and have discussed the risks and benefits. Risks include bleeding, tamponade, stroke, and damage to other surrounding organs. He understands the risks of the procedure. He is still wearing his monitor, and he would like to see the results of his monitor and how much atrial fibrillation he is having. We Matalynn Graff see him back in 4-6 weeks to determine if he is having an excessive amount of atrial fibrillation. If he is we'll plan for ablation.  I have reviewed the patients BMI and decreased success rates with ablation at length today.  Weight loss is strongly advised.  Per Guijian et al (PACE 2013; 36: 161-096), patients with BMI 25-29.9 (obese) have a 27% increase in AF recurrence post ablation.  Patients with BMI >30 have a 31% increase in AF recurrence post ablation when compared to those with BMI <25.  This patients CHA2DS2-VASc Score and unadjusted Ischemic  Stroke Rate (% per year) is equal to 2.2 % stroke rate/year from a score of 2  Above score calculated as 1 point each if present [CHF, HTN, DM, Vascular=MI/PAD/Aortic Plaque, Age if 65-74, or Male] Above score calculated as 2 points each if present [Age > 75, or Stroke/TIA/TE]  3. Hypertension: Well controlled with lisinopril and coreg  Current medicines are reviewed at length with the patient today.   The patient does not have concerns regarding his medicines.  The following changes were made today:  none  Labs/ tests ordered today include:  No orders of the defined types were placed in this encounter.     Disposition:   FU with Pixie Burgener in 4-6weeks  Signed, Hanni Milford [name], MD  12/23/2015 8:53 AM     [facility] [address] [place] [address] [phone] (office) [phone] (fax)

## 2015-12-23 NOTE — Patient Instructions (Signed)
Medication Instructions:  Your physician recommends that you continue on your current medications as directed. Please refer to the Current Medication list given to you today.  Labwork: None ordered  Testing/Procedures: None ordered  Follow-Up: Your physician recommends that you schedule a follow-up appointment in: 4-6 weeks with Dr. Elberta Fortis.    Any Other Special Instructions Will Be Listed Below (If Applicable). We will work on obtaining the entire event monitor results from Dr. [name] .  If you need a refill on your cardiac medications before your next appointment, please call your pharmacy.  Thank you for choosing [facility]!!   Dory Horn, RN [phone]

## 2016-01-02 ENCOUNTER — Telehealth: Payer: Self-pay | Admitting: Cardiology

## 2016-01-02 NOTE — Telephone Encounter (Signed)
Informed patient that we would like him to come in for an office visit to discuss changes and possible procedure. Patient agreeable. Scheduled for Wednesday 2/8.

## 2016-01-02 NOTE — Telephone Encounter (Signed)
Dr. Elberta Fortis spoke with patient last week. Informed patient that recommendation is to stop Tikosyn and begin Amiodarone and schedule ablation. Patient was unaware of medication change and would like more discussion. Advised that I would review with Dr. Elberta Fortis and call him back.

## 2016-01-02 NOTE — Telephone Encounter (Signed)
New problem   Pt need call back to discuss procedure. Please call pt.

## 2016-01-06 ENCOUNTER — Telehealth: Payer: Self-pay | Admitting: Cardiology

## 2016-01-06 NOTE — Telephone Encounter (Signed)
New message     Pt is due to have a procedure on 01-07-15.  He is summons for jury duty on 01-13-16.  Please call

## 2016-01-06 NOTE — Telephone Encounter (Signed)
Informed patient that we will address tomorrow at office visit. Asked pt to call courthouse to speak with someone about what is needed from [place] for him to not be on jury duty post heart procedure. States that he has already called and left message for someone to call him back to discuss. Advised that a letter from [place] explaining issue/concern/limitations should be enough, but discuss more tomorrow. Patient is agreeable.

## 2016-01-06 NOTE — Progress Notes (Signed)
Electrophysiology Office Note   Date:  01/07/2016   ID:  Todd [name], DOB May 06, 1966, MRN [id number]  PCP:  Martha Clan, MD  Cardiologist:  [name] Primary Electrophysiologist:  Will [name], MD    Chief Complaint  Patient presents with  . Follow-up     History of Present Illness: Todd [name] is a 50 y.o. male who presents today for electrophysiology evaluation.   He has a history of systolic HF with an EF of 30%. He had a cath which showed no CAD.[name] He also has OSA and is on CPAP, HTN, HLD, morbid obesity, as well as occasional PVCs. He presents today for follow-up of his atrial fibrillation. He was admitted to the hospital for dofetilide and loading earlier this month. He had a 30 day monitor placed, and was found to have  episodes of atrial fibrillation with rapid ventricular rate. It was also noted that he had PVCs on his monitor.he is currently feeling well without major complaints. He doesn't can continue to have palpitations at night mainly towards the end of the day.  Today, he denies symptoms of chest pain, shortness of breath, orthopnea, PND, lower extremity edema, claudication, dizziness, presyncope, syncope, bleeding, or neurologic sequela. The patient is tolerating medications without difficulties and is otherwise without complaint today.    Past Medical History  Diagnosis Date  . GERD (gastroesophageal reflux disease)   . HLD (hyperlipidemia)   . OSA on CPAP   . Gout   . PVC (premature ventricular contraction)   . Dizziness   . Atypical chest pain   . IFG (impaired fasting glucose)   . Obesity   . Hypertension   . CHF (congestive heart failure) (HCC)   . Shortness of breath dyspnea    Past Surgical History  Procedure Laterality Date  . Carpal tunnel release    . Cardiac catheterization N/A 07/01/2015    Procedure: Left Heart Cath;  Surgeon: Yates Decamp, MD;  Location: [facility] INVASIVE CV LAB;  Service: Cardiovascular;  Laterality: N/A;     Current  Outpatient Prescriptions  Medication Sig Dispense Refill  . acetaminophen (TYLENOL) 500 MG tablet Take 1,000 mg by mouth daily as needed for headache.    [name] atorvastatin (LIPITOR) 20 MG tablet Take 1 tablet (20 mg total) by mouth daily at 6 PM. 30 tablet 2  . carvedilol (COREG) 12.5 MG tablet Take 12.5 mg by mouth 2 (two) times daily.    . Cholecalciferol (VITAMIN D) 2000 units tablet Take 2,000 Units by mouth daily.    [name] dofetilide (TIKOSYN) 500 MCG capsule Take 1 capsule (500 mcg total) by mouth every 12 (twelve) hours. 14 capsule 0  . olmesartan (BENICAR) 40 MG tablet Take 40 mg by mouth at bedtime.    . potassium chloride (K-DUR) 10 MEQ tablet Take 1 tablet (10 mEq total) by mouth 2 (two) times daily. 60 tablet 6  . rivaroxaban (XARELTO) 20 MG TABS tablet Take 1 tablet (20 mg total) by mouth daily with supper. 30 tablet 0   No current facility-administered medications for this visit.    Allergies:   Metoprolol; Valsartan; Azithromycin; and Entresto   Social History:  The patient  reports that he has never smoked. He has never used smokeless tobacco. He reports that he drinks about 0.6 oz of alcohol per week. He reports that he does not use illicit drugs.   Family History:  The patient's family history includes COPD in his mother; Deep vein thrombosis in his mother; Diabetes in  his father; Heart failure in his mother; Hypertension in his father; Lung cancer in his mother; Prostate cancer in his father; Seizures in his mother; Stroke in his paternal grandfather. There is no history of Heart attack.    ROS:  Please see the history of present illness.   Otherwise, review of systems is positive for palpitatiosn.   All other systems are reviewed and negative.    PHYSICAL EXAM: VS:  BP 165/105 mmHg  Pulse 53  Ht 5\' 6"  (1.676 m)  Wt 236 lb 9.6 oz (107.321 kg)  BMI 38.21 kg/m2 , BMI Body mass index is 38.21 kg/(m^2). GEN: Well nourished, well developed, in no acute distress HEENT:  normal Neck: no JVD, carotid bruits, or masses Cardiac: RRR; no murmurs, rubs, or gallops,no edema  Respiratory:  clear to auscultation bilaterally, normal work of breathing GI: soft, nontender, nondistended, + BS MS: no deformity or atrophy Skin: warm and dry Neuro:  Strength and sensation are intact Psych: euthymic mood, full affect  EKG:  EKG is not ordered today.  Recent Labs: 12/07/2015: ALT 28; B Natriuretic Peptide 148.2* 12/10/2015: BUN 12; Creatinine, Ser 1.02; Hemoglobin 14.4; Magnesium 2.2; Platelets 176; Potassium 3.6; Sodium 140    Lipid Panel     Component Value Date/Time   CHOL 166 12/08/2015 0352   TRIG 89 12/08/2015 0352   HDL 37* 12/08/2015 0352   CHOLHDL 4.5 12/08/2015 0352   VLDL 18 12/08/2015 0352   LDLCALC 111* 12/08/2015 0352     Wt Readings from Last 3 Encounters:  01/07/16 236 lb 9.6 oz (107.321 kg)  12/23/15 233 lb (105.688 kg)  12/10/15 228 lb 13.4 oz (103.8 kg)      Other studies Reviewed: Additional studies/ records that were reviewed today include: TTE 12/08/15  Review of the above records today demonstrates:  - Left ventricle: The cavity size was mildly dilated. There was mild concentric hypertrophy. Systolic function was severely reduced. The estimated ejection fraction was in the range of 20% to 25%. Diffuse hypokinesis. Features are consistent with a pseudonormal left ventricular filling pattern, with concomitant abnormal relaxation and increased filling pressure (grade 2 diastolic dysfunction). Doppler parameters are consistent with both elevated ventricular end-diastolic filling pressure and elevated left atrial filling pressure. - Mitral valve: There was mild to moderate regurgitation.   ASSESSMENT AND PLAN:  1.  Persistent atrial fibrillation:continues to have palpitations as well as A. Fib with RVR on his monitor while on 2000. I discussed with him the options of ablation with the fast rates that he is having. We  both agreed that ablation is the right course of action at this time. He is hesitant though, and wishes for me to continue to review his records. I discussed with him the risks of the procedure which include bleeding, tamponade, stroke, and damage to surrounding structures. He understands these and agrees to the procedure. I will further discuss this with his primary cardiologist.  This patients CHA2DS2-VASc Score and unadjusted Ischemic Stroke Rate (% per year) is equal to 2.2 % stroke rate/year from a score of 2  Above score calculated as 1 point each if present [CHF, HTN, DM, Vascular=MI/PAD/Aortic Plaque, Age if 65-74, or Male] Above score calculated as 2 points each if present [Age > 75, or Stroke/TIA/TE]     Current medicines are reviewed at length with the patient today.   The patient does not have concerns regarding his medicines.  The following changes were made today:  Coreg 12.5mg  BID  Labs/ tests  ordered today include:  Orders Placed This Encounter  Procedures  . CBC w/Diff  . Basic metabolic panel     Disposition:   FU with Will Camnitz post ablation  Signed, Will [name], MD  01/07/2016 11:35 AM     [facility] [address] [place] [address] [phone] (office) [phone] (fax)

## 2016-01-07 ENCOUNTER — Ambulatory Visit (INDEPENDENT_AMBULATORY_CARE_PROVIDER_SITE_OTHER): Payer: BLUE CROSS/BLUE SHIELD | Admitting: Cardiology

## 2016-01-07 ENCOUNTER — Encounter: Payer: Self-pay | Admitting: *Deleted

## 2016-01-07 ENCOUNTER — Encounter: Payer: Self-pay | Admitting: Cardiology

## 2016-01-07 VITALS — BP 165/105 | HR 53 | Ht 66.0 in | Wt 236.6 lb

## 2016-01-07 DIAGNOSIS — Z01812 Encounter for preprocedural laboratory examination: Secondary | ICD-10-CM

## 2016-01-07 DIAGNOSIS — I4891 Unspecified atrial fibrillation: Secondary | ICD-10-CM | POA: Diagnosis not present

## 2016-01-07 NOTE — Patient Instructions (Addendum)
Medication Instructions:  Your physician has recommended you make the following change in your medication:  1) INCREASE Carvedilol to 12.5 mg twice a day  Labwork: Your physician recommends that you return for pre procedure lab work within 7-14 days prior to procedure on 02/05/16  Testing/Procedures: Your physician has requested that you have cardiac CT (within 7 days prior to ablation on 02/05/16). Cardiac computed tomography (CT) is a painless test that uses an x-ray machine to take clear, detailed pictures of your heart. For further information please visit [email or web address] Please follow instruction sheet as given.  Your physician has recommended that you have an ablation. Catheter ablation is a medical procedure used to treat some cardiac arrhythmias (irregular heartbeats). During catheter ablation, a long, thin, flexible tube is put into a blood vessel in your groin (upper thigh), or neck. This tube is called an ablation catheter. It is then guided to your heart through the blood vessel. Radio frequency waves destroy small areas of heart tissue where abnormal heartbeats may cause an arrhythmia to start. Please see the instruction sheet given to you today.  Follow-Up: Your physician recommends that you schedule a follow-up appointment in: 4 weeks, after your procedure on 02/05/16, with Rudi Coco, NP in the AFib clinic.  Your physician recommends that you schedule a follow-up appointment in: 3 months, after your procedure on 02/05/16, with Dr. Elberta Fortis.  If you need a refill on your cardiac medications before your next appointment, please call your pharmacy.  Thank you for choosing [facility]!!   Dory Horn, RN [phone]   Any Other Special Instructions Will Be Listed Below (If Applicable).  Cardiac Ablation Cardiac ablation is a procedure to disable a small amount of heart tissue in very specific places. The heart has many electrical connections. Sometimes these connections  are abnormal and can cause the heart to beat very fast or irregularly. By disabling some of the problem areas, heart rhythm can be improved or made normal. Ablation is done for people who:   Have Wolff-Parkinson-White syndrome.   Have other fast heart rhythms (tachycardia).   Have taken medicines for an abnormal heart rhythm (arrhythmia) that resulted in:   No success.   Side effects.   May have a high-risk heartbeat that could result in death.  LET [facility] CARE PROVIDER KNOW ABOUT:   Any allergies you have or any previous reactions you have had to X-ray dye, food (such as seafood), medicine, or tape.   All medicines you are taking, including vitamins, herbs, eye drops, creams, and over-the-counter medicines.   Previous problems you or members of your family have had with the use of anesthetics.   Any blood disorders you have.   Previous surgeries or procedures (such as a kidney transplant) you have had.   Medical conditions you have (such as kidney failure).  RISKS AND COMPLICATIONS Generally, cardiac ablation is a safe procedure. However, problems can occur and include:   Increased risk of cancer. Depending on how long it takes to do the ablation, the dose of radiation can be high.  Bruising and bleeding where a thin, flexible tube (catheter) was inserted during the procedure.   Bleeding into the chest, especially into the sac that surrounds the heart (serious).  Need for a permanent pacemaker if the normal electrical system is damaged.   The procedure may not be fully effective, and this may not be recognized for months. Repeat ablation procedures are sometimes required. BEFORE THE PROCEDURE   Follow any instructions  from your health care provider regarding eating and drinking before the procedure.   Take your medicines as directed at regular times with water, unless instructed otherwise by your health care provider. If you are taking diabetes  medicine, including insulin, ask how you are to take it and if there are any special instructions you should follow. It is common to adjust insulin dosing the day of the ablation.  PROCEDURE  An ablation is usually performed in a catheterization laboratory with the guidance of fluoroscopy. Fluoroscopy is a type of X-ray that helps your health care provider see images of your heart during the procedure.   An ablation is a minimally invasive procedure. This means a small cut (incision) is made in either your neck or groin. Your health care provider will decide where to make the incision based on your medical history and physical exam.  An IV tube will be started before the procedure begins. You will be given an anesthetic or medicine to help you relax (sedative).  The skin on your neck or groin will be numbed. A needle will be inserted into a large vein in your neck or groin and catheters will be threaded to your heart.  A special dye that shows up on fluoroscopy pictures may be injected through the catheter. The dye helps your health care provider see the area of the heart that needs treatment.  The catheter has electrodes on the tip. When the area of heart tissue that is causing the arrhythmia is found, the catheter tip will send an electrical current to the area and "scar" the tissue. Three types of energy can be used to ablate the heart tissue:   Heat (radiofrequency energy).   Laser energy.   Extreme cold (cryoablation).   When the area of the heart has been ablated, the catheter will be taken out. Pressure will be held on the insertion site. This will help the insertion site clot and keep it from bleeding. A bandage will be placed on the insertion site.  AFTER THE PROCEDURE   After the procedure, you will be taken to a recovery area where your vital signs (blood pressure, heart rate, and breathing) will be monitored. The insertion site will also be monitored for bleeding.   You  will need to lie still for 4-6 hours. This is to ensure you do not bleed from the catheter insertion site.    This information is not intended to replace advice given to you by your health care provider. Make sure you discuss any questions you have with your health care provider.   Document Released: 04/03/2009 Document Revised: 12/06/2014 Document Reviewed: 04/09/2013 Elsevier Interactive Patient Education [employer]

## 2016-01-07 NOTE — Addendum Note (Signed)
Addended by: Baird Lyons on: 01/07/2016 02:07 PM   Modules accepted: Orders

## 2016-01-09 ENCOUNTER — Telehealth: Payer: Self-pay | Admitting: Cardiology

## 2016-01-09 NOTE — Telephone Encounter (Signed)
Calling stating Dr. Elberta Fortis increased his coreg to 12.5 mg to twice a day when he was seen on Wed 2/8.  BP at visit was 165/105 and then at end of visit was 145/95.  He has been taking BP at home and has been ranging 135-140/95-102.  He just wanted to make sure that was in the range he wanted or if he should increase his Benicar,  Advised will forward to Dr. Elberta Fortis for recommendations.

## 2016-01-09 NOTE — Telephone Encounter (Signed)
Spoke w/Luke Idelle Jo regarding his BP readings.  Franky Macho suggests that he take BP twice a day until next Wed and record.  Call us with results.  Will not change doses now since just started on Wed 2/8.  Mr. braysen hyden understanding and will record and call back next week.

## 2016-01-09 NOTE — Telephone Encounter (Signed)
New message      Talk to a nurse regarding upcoming procedure

## 2016-01-16 ENCOUNTER — Encounter: Payer: Self-pay | Admitting: Cardiology

## 2016-01-19 ENCOUNTER — Telehealth: Payer: Self-pay | Admitting: Cardiology

## 2016-01-19 NOTE — Telephone Encounter (Signed)
New Message:  Pt called in stating that he woke up this morning with an irregular heartbeart. He also says that his pulse and BP is elevated and would like to know what to do.

## 2016-01-19 NOTE — Telephone Encounter (Signed)
Patient tells me that last week he missed Wednesday morning dose of Tikosyn (he spoke with Dr Verl Dicker office about it) and ever since then his "heart is beating funny", "pounding at times".  He has spoken to Dr. Verl Dicker office several times with advisement to monitor for now. He spoke with them this morning to report BP at 140/100 and HR at 101 -- they gave him instructions to take an extra dose of carvedilol, which he did. Reports follow up BP to be 120/87 and HR in 60s. He is following up with [place] to make sure he doesn't need to do anything else or be concerned.  Advised patient to continue to monitoring BPs/HRs.  He is to call back by the end of the week if issues still remain with BP/HR.

## 2016-01-23 ENCOUNTER — Telehealth: Payer: Self-pay | Admitting: Cardiology

## 2016-01-23 NOTE — Telephone Encounter (Signed)
Pt had a weird headache,wants to find out if it might be coming from his medicine.

## 2016-01-23 NOTE — Telephone Encounter (Signed)
Informed patient that this is not related to his medication, per Dr. Elberta Fortis. Patient verbalized understanding and agreeable to plan.  Advised to follow up w/ PCP about concern - he describes a numb feeling at the back of his head.

## 2016-01-27 ENCOUNTER — Ambulatory Visit: Payer: BLUE CROSS/BLUE SHIELD | Admitting: Cardiology

## 2016-01-27 ENCOUNTER — Other Ambulatory Visit (INDEPENDENT_AMBULATORY_CARE_PROVIDER_SITE_OTHER): Payer: BLUE CROSS/BLUE SHIELD

## 2016-01-27 DIAGNOSIS — I480 Paroxysmal atrial fibrillation: Secondary | ICD-10-CM

## 2016-01-27 LAB — BASIC METABOLIC PANEL
BUN: 13 mg/dL (ref 7–25)
CHLORIDE: 104 mmol/L (ref 98–110)
CO2: 28 mmol/L (ref 20–31)
CREATININE: 0.93 mg/dL (ref 0.60–1.35)
Calcium: 8.8 mg/dL (ref 8.6–10.3)
GLUCOSE: 91 mg/dL (ref 65–99)
Potassium: 4.2 mmol/L (ref 3.5–5.3)
Sodium: 140 mmol/L (ref 135–146)

## 2016-01-27 LAB — CBC
HCT: 41.7 % (ref 39.0–52.0)
Hemoglobin: 13.8 g/dL (ref 13.0–17.0)
MCH: 26.1 pg (ref 26.0–34.0)
MCHC: 33.1 g/dL (ref 30.0–36.0)
MCV: 79 fL (ref 78.0–100.0)
MPV: 9.8 fL (ref 8.6–12.4)
PLATELETS: 182 10*3/uL (ref 150–400)
RBC: 5.28 MIL/uL (ref 4.22–5.81)
RDW: 15.2 % (ref 11.5–15.5)
WBC: 5.8 10*3/uL (ref 4.0–10.5)

## 2016-01-29 ENCOUNTER — Ambulatory Visit (HOSPITAL_COMMUNITY)
Admission: RE | Admit: 2016-01-29 | Discharge: 2016-01-29 | Disposition: A | Discharge status: Home / Self Care | Payer: BLUE CROSS/BLUE SHIELD | Source: Ambulatory Visit | Attending: Cardiology | Admitting: Cardiology

## 2016-01-29 DIAGNOSIS — I4891 Unspecified atrial fibrillation: Secondary | ICD-10-CM | POA: Insufficient documentation

## 2016-01-29 MED ORDER — IOHEXOL 350 MG/ML SOLN
80.0000 mL | Freq: Once | INTRAVENOUS | Status: AC | PRN
  Administered 2016-01-29: 100 mL via INTRAVENOUS

## 2016-01-29 NOTE — Progress Notes (Addendum)
Pt returned from CT heart.  C/O left sided chest pain (2-3) similar to GERD pain he has had before and this am.  Felt "woozy" after CT.  Reclined in cardiac chair BP 149/112, NSR 69.  Given po's.  States feeling better already.  Will monitor.  IV already d/c'd by Ct.

## 2016-01-29 NOTE — Progress Notes (Signed)
Pt's chest discomfort resolved, VSS.  No light headedness.  Has taken po's and feels better.  Wants to d/c.  Pt up and ambulatory to BR, feels good, escorted to elevator.

## 2016-02-04 ENCOUNTER — Other Ambulatory Visit: Payer: Self-pay | Admitting: Physician Assistant

## 2016-02-05 ENCOUNTER — Ambulatory Visit (HOSPITAL_COMMUNITY): Payer: BLUE CROSS/BLUE SHIELD | Admitting: Certified Registered Nurse Anesthetist

## 2016-02-05 ENCOUNTER — Ambulatory Visit (HOSPITAL_COMMUNITY)
Admission: RE | Admit: 2016-02-05 | Discharge: 2016-02-06 | Disposition: A | Discharge status: Home / Self Care | Payer: BLUE CROSS/BLUE SHIELD | Source: Ambulatory Visit | Attending: Cardiology | Admitting: Cardiology

## 2016-02-05 ENCOUNTER — Encounter (HOSPITAL_COMMUNITY): Payer: Self-pay | Admitting: Certified Registered Nurse Anesthetist

## 2016-02-05 ENCOUNTER — Encounter (HOSPITAL_COMMUNITY)
Admission: RE | Disposition: A | Discharge status: Home / Self Care | Payer: Self-pay | Source: Ambulatory Visit | Attending: Cardiology

## 2016-02-05 DIAGNOSIS — Z7901 Long term (current) use of anticoagulants: Secondary | ICD-10-CM | POA: Diagnosis not present

## 2016-02-05 DIAGNOSIS — I4891 Unspecified atrial fibrillation: Secondary | ICD-10-CM | POA: Diagnosis present

## 2016-02-05 DIAGNOSIS — I110 Hypertensive heart disease with heart failure: Secondary | ICD-10-CM | POA: Diagnosis not present

## 2016-02-05 DIAGNOSIS — I481 Persistent atrial fibrillation: Secondary | ICD-10-CM | POA: Insufficient documentation

## 2016-02-05 DIAGNOSIS — Z6836 Body mass index (BMI) 36.0-36.9, adult: Secondary | ICD-10-CM | POA: Diagnosis not present

## 2016-02-05 DIAGNOSIS — I480 Paroxysmal atrial fibrillation: Secondary | ICD-10-CM | POA: Diagnosis not present

## 2016-02-05 DIAGNOSIS — Z79899 Other long term (current) drug therapy: Secondary | ICD-10-CM | POA: Diagnosis not present

## 2016-02-05 DIAGNOSIS — I428 Other cardiomyopathies: Secondary | ICD-10-CM | POA: Insufficient documentation

## 2016-02-05 DIAGNOSIS — G4733 Obstructive sleep apnea (adult) (pediatric): Secondary | ICD-10-CM | POA: Diagnosis not present

## 2016-02-05 DIAGNOSIS — E785 Hyperlipidemia, unspecified: Secondary | ICD-10-CM | POA: Diagnosis not present

## 2016-02-05 DIAGNOSIS — I5022 Chronic systolic (congestive) heart failure: Secondary | ICD-10-CM | POA: Diagnosis not present

## 2016-02-05 DIAGNOSIS — I472 Ventricular tachycardia: Secondary | ICD-10-CM | POA: Diagnosis not present

## 2016-02-05 HISTORY — PX: ELECTROPHYSIOLOGIC STUDY: SHX172A

## 2016-02-05 LAB — POCT ACTIVATED CLOTTING TIME
ACTIVATED CLOTTING TIME: 338 s
Activated Clotting Time: 322 seconds
Activated Clotting Time: 147 seconds

## 2016-02-05 LAB — MRSA PCR SCREENING: MRSA by PCR: NEGATIVE

## 2016-02-05 SURGERY — ATRIAL FIBRILLATION ABLATION
Anesthesia: General

## 2016-02-05 MED ORDER — SODIUM CHLORIDE 0.9 % IV SOLN: 250.0000 mL | INTRAVENOUS | Status: DC | PRN

## 2016-02-05 MED ORDER — ACETAMINOPHEN 325 MG PO TABS
ORAL_TABLET | ORAL | Status: AC
  Filled 2016-02-05: qty 2

## 2016-02-05 MED ORDER — ADENOSINE 6 MG/2ML IV SOLN
INTRAVENOUS | Status: AC
  Filled 2016-02-05: qty 8

## 2016-02-05 MED ORDER — ACETAMINOPHEN 500 MG PO TABS: 1000.0000 mg | ORAL_TABLET | Freq: Every day | ORAL | Status: DC | PRN

## 2016-02-05 MED ORDER — SODIUM CHLORIDE 0.9% FLUSH
3.0000 mL | Freq: Two times a day (BID) | INTRAVENOUS | Status: DC
  Administered 2016-02-05 – 2016-02-06 (×2): 3 mL via INTRAVENOUS

## 2016-02-05 MED ORDER — MIDAZOLAM HCL 5 MG/5ML IJ SOLN
INTRAMUSCULAR | Status: DC | PRN
  Administered 2016-02-05: 2 mg via INTRAVENOUS

## 2016-02-05 MED ORDER — ONDANSETRON HCL 4 MG/2ML IJ SOLN: 4.0000 mg | Freq: Four times a day (QID) | INTRAMUSCULAR | Status: DC | PRN

## 2016-02-05 MED ORDER — LIDOCAINE HCL (CARDIAC) 20 MG/ML IV SOLN
INTRAVENOUS | Status: DC | PRN
  Administered 2016-02-05: 60 mg via INTRAVENOUS

## 2016-02-05 MED ORDER — FENTANYL CITRATE (PF) 100 MCG/2ML IJ SOLN: 25.0000 ug | INTRAMUSCULAR | Status: DC | PRN

## 2016-02-05 MED ORDER — OXYCODONE HCL 5 MG PO TABS: 5.0000 mg | ORAL_TABLET | Freq: Once | ORAL | Status: DC | PRN

## 2016-02-05 MED ORDER — PHENYLEPHRINE HCL 10 MG/ML IJ SOLN
10.0000 mg | INTRAMUSCULAR | Status: DC | PRN
  Administered 2016-02-05: 20 ug/min via INTRAVENOUS

## 2016-02-05 MED ORDER — OLMESARTAN MEDOXOMIL 40 MG PO TABS
40.0000 mg | ORAL_TABLET | Freq: Every day | ORAL | Status: DC
Start: 2016-02-05 — End: 2016-02-06
  Administered 2016-02-05: 40 mg via ORAL
  Filled 2016-02-05: qty 1

## 2016-02-05 MED ORDER — PROPOFOL 10 MG/ML IV BOLUS
INTRAVENOUS | Status: DC | PRN
  Administered 2016-02-05: 100 mg via INTRAVENOUS
  Administered 2016-02-05: 70 mg via INTRAVENOUS

## 2016-02-05 MED ORDER — HEPARIN SODIUM (PORCINE) 1000 UNIT/ML IJ SOLN
INTRAMUSCULAR | Status: AC
  Filled 2016-02-05: qty 1

## 2016-02-05 MED ORDER — HEPARIN (PORCINE) IN NACL 2-0.9 UNIT/ML-% IJ SOLN
INTRAMUSCULAR | Status: AC
  Filled 2016-02-05: qty 500

## 2016-02-05 MED ORDER — PROTAMINE SULFATE 10 MG/ML IV SOLN
INTRAVENOUS | Status: DC | PRN
  Administered 2016-02-05 (×4): 10 mg via INTRAVENOUS

## 2016-02-05 MED ORDER — HEPARIN SODIUM (PORCINE) 1000 UNIT/ML IJ SOLN
INTRAMUSCULAR | Status: DC | PRN
  Administered 2016-02-05: 1000 [IU] via INTRAVENOUS
  Administered 2016-02-05: 14000 [IU] via INTRAVENOUS
  Administered 2016-02-05: 1000 [IU] via INTRAVENOUS

## 2016-02-05 MED ORDER — POTASSIUM CHLORIDE ER 10 MEQ PO TBCR
10.0000 meq | EXTENDED_RELEASE_TABLET | Freq: Two times a day (BID) | ORAL | Status: DC
  Administered 2016-02-05 – 2016-02-06 (×2): 10 meq via ORAL
  Filled 2016-02-05 (×5): qty 1

## 2016-02-05 MED ORDER — FENTANYL CITRATE (PF) 100 MCG/2ML IJ SOLN
INTRAMUSCULAR | Status: DC | PRN
  Administered 2016-02-05: 50 ug via INTRAVENOUS

## 2016-02-05 MED ORDER — CARVEDILOL 12.5 MG PO TABS
12.5000 mg | ORAL_TABLET | Freq: Two times a day (BID) | ORAL | Status: DC
  Administered 2016-02-05 – 2016-02-06 (×2): 12.5 mg via ORAL
  Filled 2016-02-05 (×3): qty 1

## 2016-02-05 MED ORDER — OXYCODONE HCL 5 MG/5ML PO SOLN: 5.0000 mg | Freq: Once | ORAL | Status: DC | PRN

## 2016-02-05 MED ORDER — HEPARIN (PORCINE) IN NACL 2-0.9 UNIT/ML-% IJ SOLN
INTRAMUSCULAR | Status: DC | PRN
  Administered 2016-02-05: 11:00:00

## 2016-02-05 MED ORDER — ADENOSINE 6 MG/2ML IV SOLN
INTRAVENOUS | Status: DC | PRN
  Administered 2016-02-05 (×2): 12 mg via INTRAVENOUS

## 2016-02-05 MED ORDER — ACETAMINOPHEN 325 MG PO TABS
650.0000 mg | ORAL_TABLET | ORAL | Status: DC | PRN
  Administered 2016-02-05 (×2): 650 mg via ORAL
  Filled 2016-02-05 (×2): qty 2

## 2016-02-05 MED ORDER — IRBESARTAN 75 MG PO TABS
75.0000 mg | ORAL_TABLET | Freq: Every day | ORAL | Status: DC
  Filled 2016-02-05: qty 1

## 2016-02-05 MED ORDER — DOFETILIDE 500 MCG PO CAPS
500.0000 ug | ORAL_CAPSULE | Freq: Two times a day (BID) | ORAL | Status: DC
  Administered 2016-02-05 – 2016-02-06 (×2): 500 ug via ORAL
  Filled 2016-02-05 (×3): qty 1

## 2016-02-05 MED ORDER — BUPIVACAINE HCL (PF) 0.25 % IJ SOLN
INTRAMUSCULAR | Status: AC
  Filled 2016-02-05: qty 30

## 2016-02-05 MED ORDER — RIVAROXABAN 20 MG PO TABS
20.0000 mg | ORAL_TABLET | Freq: Every day | ORAL | Status: DC
  Administered 2016-02-05: 20 mg via ORAL
  Filled 2016-02-05: qty 1

## 2016-02-05 MED ORDER — SODIUM CHLORIDE 0.9% FLUSH: 3.0000 mL | INTRAVENOUS | Status: DC | PRN

## 2016-02-05 MED ORDER — ATORVASTATIN CALCIUM 20 MG PO TABS
20.0000 mg | ORAL_TABLET | Freq: Every day | ORAL | Status: DC
  Administered 2016-02-05: 20 mg via ORAL
  Filled 2016-02-05: qty 1

## 2016-02-05 MED ORDER — HEPARIN SODIUM (PORCINE) 1000 UNIT/ML IJ SOLN
INTRAMUSCULAR | Status: DC | PRN
  Administered 2016-02-05: 1000 [IU] via INTRAVENOUS

## 2016-02-05 MED ORDER — SODIUM CHLORIDE 0.9 % IV SOLN
INTRAVENOUS | Status: DC | PRN
  Administered 2016-02-05: 08:00:00 via INTRAVENOUS

## 2016-02-05 MED ORDER — VITAMIN D 1000 UNITS PO TABS
2000.0000 [IU] | ORAL_TABLET | Freq: Every day | ORAL | Status: DC
  Administered 2016-02-05 – 2016-02-06 (×2): 2000 [IU] via ORAL
  Filled 2016-02-05 (×3): qty 2

## 2016-02-05 SURGICAL SUPPLY — 21 items
BAG SNAP BAND KOVER 36X36 (MISCELLANEOUS) ×3 IMPLANT
BLANKET WARM UNDERBOD FULL ACC (MISCELLANEOUS) ×3 IMPLANT
CATH NAVISTAR SMARTTOUCH DF (ABLATOR) ×3 IMPLANT
CATH SOUNDSTAR ECO REPROCESSED (CATHETERS) ×3 IMPLANT
CATH VARIABLE LASSO NAV 2515 (CATHETERS) ×3 IMPLANT
CATH WEBSTER BI DIR CS D-F CRV (CATHETERS) ×3 IMPLANT
COVER SWIFTLINK CONNECTOR (BAG) ×3 IMPLANT
NEEDLE TRANSSEPTAL BRK 98CM (NEEDLE) ×3 IMPLANT
PACK EP LATEX FREE (CUSTOM PROCEDURE TRAY) ×2
PACK EP LF (CUSTOM PROCEDURE TRAY) ×1 IMPLANT
PAD DEFIB LIFELINK (PAD) ×3 IMPLANT
PATCH CARTO3 (PAD) ×3 IMPLANT
SHEATH AGILIS NXT 8.5F 71CM (SHEATH) ×6 IMPLANT
SHEATH AVANTI 11F 11CM (SHEATH) ×3 IMPLANT
SHEATH PINNACLE 7F 10CM (SHEATH) ×3 IMPLANT
SHEATH PINNACLE 8F 10CM (SHEATH) ×6 IMPLANT
SHEATH PINNACLE 9F 10CM (SHEATH) ×6 IMPLANT
SHIELD RADPAD SCOOP 12X17 (MISCELLANEOUS) ×3 IMPLANT
TUBING ART PRESS 72  MALE/FEM (TUBING) ×4
TUBING ART PRESS 72 MALE/FEM (TUBING) ×2 IMPLANT
TUBING SMART ABLATE COOLFLOW (TUBING) ×3 IMPLANT

## 2016-02-05 NOTE — H&P (Signed)
Electrophysiology Office Note   Date:  02/05/2016   ID:  Todd [name], DOB 20-Jul-1966, MRN [id number]  PCP:  Martha Clan, MD  Cardiologist:  [name] Primary Electrophysiologist:  Regan Lemming, MD    No chief complaint on file.    History of Present Illness: Todd [name] is a 50 y.o. male who presents today for electrophysiology evaluation.   He has a history of systolic HF with an EF of 30%. He had a cath which showed no CAD.[name] He also has OSA and is on CPAP, HTN, HLD, morbid obesity, as well as occasional PVCs. He presents today for follow-up of his atrial fibrillation. He was admitted to the hospital for dofetilide and loading earlier this month. He had a 30 day monitor placed, and was found to have episodes of atrial fibrillation with rapid ventricular rate. He presents today for AF ablation.   Today, he denies symptoms of palpitations, chest pain, shortness of breath, orthopnea, PND, lower extremity edema, claudication, dizziness, presyncope, syncope, bleeding, or neurologic sequela. The patient is tolerating medications without difficulties and is otherwise without complaint today.    Past Medical History  Diagnosis Date  . GERD (gastroesophageal reflux disease)   . HLD (hyperlipidemia)   . OSA on CPAP   . Gout   . PVC (premature ventricular contraction)   . Dizziness   . Atypical chest pain   . IFG (impaired fasting glucose)   . Obesity   . Hypertension   . CHF (congestive heart failure) (HCC)   . Shortness of breath dyspnea    Past Surgical History  Procedure Laterality Date  . Carpal tunnel release    . Cardiac catheterization N/A 07/01/2015    Procedure: Left Heart Cath;  Surgeon: Yates Decamp, MD;  Location: [facility] INVASIVE CV LAB;  Service: Cardiovascular;  Laterality: N/A;     No current facility-administered medications for this encounter.    Allergies:   Metoprolol; Valsartan; Azithromycin; and Entresto   Social History:  The patient  reports that  he has never smoked. He has never used smokeless tobacco. He reports that he drinks about 0.6 oz of alcohol per week. He reports that he does not use illicit drugs.   Family History:  The patient's family history includes COPD in his mother; Deep vein thrombosis in his mother; Diabetes in his father; Heart failure in his mother; Hypertension in his father; Lung cancer in his mother; Prostate cancer in his father; Seizures in his mother; Stroke in his paternal grandfather. There is no history of Heart attack.    ROS:  Please see the history of present illness.   Otherwise, review of systems is positive for none.   All other systems are reviewed and negative.    PHYSICAL EXAM: VS:  BP 137/101 mmHg  Pulse 73  Temp(Src) 98.5 F (36.9 C) (Oral)  Resp 18  Ht  (1.676 m)  Wt 228 lb (103.42 kg)  BMI 36.82 kg/m2  SpO2 98% , BMI Body mass index is 36.82 kg/(m^2). GEN: Well nourished, well developed, in no acute distress HEENT: normal Neck: no JVD, carotid bruits, or masses Cardiac: RRR; no murmurs, rubs, or gallops,no edema  Respiratory:  clear to auscultation bilaterally, normal work of breathing GI: soft, nontender, nondistended, + BS MS: no deformity or atrophy Skin: warm and dry Neuro:  Strength and sensation are intact Psych: euthymic mood, full affect  Recent Labs: 12/07/2015: ALT 28; B Natriuretic Peptide 148.2* 12/10/2015: Magnesium 2.2 01/27/2016: BUN 13; Creat 0.93;  Hemoglobin 13.8; Platelets 182; Potassium 4.2; Sodium 140    Lipid Panel     Component Value Date/Time   CHOL 166 12/08/2015 0352   TRIG 89 12/08/2015 0352   HDL 37* 12/08/2015 0352   CHOLHDL 4.5 12/08/2015 0352   VLDL 18 12/08/2015 0352   LDLCALC 111* 12/08/2015 0352     Wt Readings from Last 3 Encounters:  02/05/16 228 lb (103.42 kg)  01/07/16 236 lb 9.6 oz (107.321 kg)  12/23/15 233 lb (105.688 kg)   ASSSMENT AND PLAN:  1.  Atrial fibrillation: continues to have palpitations as well as A. Fib with  RVR on his monitor. I discussed with him the options of ablation with the fast rates that he is having. We both agreed that ablation is the right course of action at this time.  I discussed with him the risks of the procedure which include bleeding, tamponade, stroke, and damage to surrounding structures. He understands the risks and wishes to proceed with the procedure.  Labs/ tests ordered today include:  Orders Placed This Encounter  Procedures  . Diet NPO time specified Except for: Sips with Meds  . Informed consent details: transcribe and obtain patient signature  . Clip right and left femoral area PM before surgery  . Clip right internal jugular area PM before surgery  . Verify informed consent  . Void on call to EP Lab  . Lab instructions  . AM of procedure hold 70/30 insulin dose (if ordered)  . AM of procedure hold oral hypoglycemic agents (if ordered)  . If patient on AM basal insulin: AM ON DAY OF PROCEDURE:  Give 1/2 basal dose (Lantus, Levemir, or NPH)  . EP Study  . Insert peripheral IV   Signed, Will [name], MD  02/05/2016 7:05 AM     [facility] [address] [place] [address] [phone] (office) [phone] (fax)

## 2016-02-05 NOTE — Progress Notes (Signed)
NIV was set up per patient comfort. o2 humidity provided in the reservior and pressure set at 10 which the patient states that feels fine. Pt states he will use NIV once out of the restroom. Told pt proper use and instruction of the mask and NIV machine, verbalizes understanding. Told pt if he has any trouble to please let me know. Pt is stable at this time. Sat is 99% on ROOM AIR.

## 2016-02-05 NOTE — Discharge Summary (Signed)
ELECTROPHYSIOLOGY PROCEDURE DISCHARGE SUMMARY    Patient ID: Todd [name],  MRN: [id number], DOB/AGE: 1966-04-22 50 y.o.  Admit date: 02/05/2016 Discharge date:02/06/16   Primary Care Physician: Martha Clan, MD Primary Cardiologist: Dr. [name] Electrophysiologist: Dr. Elberta Fortis  Primary Discharge Diagnosis:  1. Persistent AFib     CHA2DS2Vasc is at least 2, on Xarelto  Secondary Discharge Diagnosis:  1. NICM 2. HTN 3. OSA/CPAP 4. HLD  Procedures This Admission:  1.  Electrophysiology study and radiofrequency catheter ablation on 02/05/16 by Dr Elberta Fortis.  This study demonstrated: CONCLUSIONS: 1. Sinus rhythm upon presentation.  2. Successful electrical isolation and anatomical encircling of all four pulmonary veins with radiofrequency current. 3. No inducible arrhythmias following ablation both on and off of Isuprel 4. No early apparent complications    Brief HPI: Todd [name] is a 50 y.o. male with a history of persistent atrial fibrillation.  They have failed medical therapy with Tikosyn. Risks, benefits, and alternatives to catheter ablation of atrial fibrillation were reviewed with the patient who wished to proceed.  The patient underwent Cardiac CT prior to the procedure which demonstrated normal LV function and no LAA thrombus.    Hospital Course:  The patient was admitted and underwent EPS/RFCA of atrial fibrillation with details as outlined above.  They were monitored on telemetry overnight which demonstrated SR, very brief NSVT.  Groin/procedure sites were  without complication on the day of discharge.  The patient was examined by  Dr. Elberta Fortis and considered to be stable for discharge.  Wound care and restrictions were reviewed with the patient.  The patient Terrina Docter be seen Dr. Elberta Fortis in 4 weeks, his EKG is reviewed by Dr. Elberta Fortis, QTc stable, no changes to his home medicines at this time.     Physical Exam: Filed Vitals:   02/06/16 0400 02/06/16 0415 02/06/16 0700  02/06/16 0812  BP: 127/93     Pulse: 68  85   Temp:  98.9 F (37.2 C)  98.3 F (36.8 C)  TempSrc:  Oral  Oral  Resp: 19  20   Height:      Weight:      SpO2: 96%  93%     GEN- The patient is well appearing, alert and oriented x 3 today.   HEENT: normocephalic, atraumatic; sclera clear, conjunctiva pink; hearing intact; oropharynx clear; neck supple  Lungs- Clear to ausculation bilaterally, normal work of breathing.  No wheezes, rales, rhonchi Heart- Regular rate and rhythm, no murmurs, rubs or gallops  GI- soft, non-tender, non-distended, bowel sounds present  Extremities- no clubbing, cyanosis, or edema; DP/PT/radial pulses 2+ bilaterally, b/l groins without hematoma/bruit MS- no significant deformity or atrophy Skin- warm and dry, no rash or lesion Psych- euthymic mood, full affect Neuro- strength and sensation are intact   Labs:   Lab Results  Component Value Date   WBC 5.8 01/27/2016   HGB 13.8 01/27/2016   HCT 41.7 01/27/2016   MCV 79.0 01/27/2016   PLT 182 01/27/2016   No results for input(s): NA, K, CL, CO2, BUN, CREATININE, CALCIUM, PROT, BILITOT, ALKPHOS, ALT, AST, GLUCOSE in the last 168 hours.  Invalid input(s): LABALBU   Discharge Medications:    Medication List    TAKE these medications        acetaminophen 500 MG tablet  Commonly known as:  TYLENOL  Take 1,000 mg by mouth daily as needed for headache.     atorvastatin 20 MG tablet  Commonly known as:  LIPITOR  Take 1  tablet (20 mg total) by mouth daily at 6 PM.     carvedilol 12.5 MG tablet  Commonly known as:  COREG  Take 12.5 mg by mouth 2 (two) times daily.     dofetilide 500 MCG capsule  Commonly known as:  TIKOSYN  Take 1 capsule (500 mcg total) by mouth every 12 (twelve) hours.     olmesartan 40 MG tablet  Commonly known as:  BENICAR  Take 40 mg by mouth at bedtime.     potassium chloride 10 MEQ tablet  Commonly known as:  K-DUR  Take 1 tablet (10 mEq total) by mouth 2 (two) times  daily.     rivaroxaban 20 MG Tabs tablet  Commonly known as:  XARELTO  Take 1 tablet (20 mg total) by mouth daily with supper.     Vitamin D 2000 units tablet  Take 2,000 Units by mouth daily.        Disposition:  Discharge Instructions    Diet - low sodium heart healthy    Complete by:  As directed      Increase activity slowly    Complete by:  As directed           Follow-up Information    Follow up with Duron Meister [name], MD On 03/02/2016.   Specialty:  Cardiology   Why:  110:45AM   Contact information:   [address] [place] [address] [phone]       Follow up with Alger Kerstein [name], MD On 05/10/2016.   Specialty:  Cardiology   Why:  9:30AM   Contact information:   [address] [place] [address] [phone]       Duration of Discharge Encounter: Greater than 30 minutes including physician time.  SignedFrancis Dowse, PA-C 02/06/2016 9:21 AM    I have seen and examined this patient with Francis Dowse.  Agree with above, note added to reflect my findings.  On exam, regular rhythm, no murmurs, lungs clear.  Had AF ablation done yesterday without complication.  In sinus rhythm today.  Had PVCs and NSVT overnight.  On tikosyn, [place] continue, may switch to amiodarone in the future.  No further changes to out patient medications.    Fusako Tanabe M. Roald Lukacs MD 02/06/2016 2:41 PM

## 2016-02-05 NOTE — Anesthesia Procedure Notes (Signed)
Procedure Name: LMA Insertion Date/Time: 02/05/2016 7:54 AM Performed by: Rogelia Boga Pre-anesthesia Checklist: Patient identified, Emergency Drugs available, Suction available, Patient being monitored and Timeout performed Patient Re-evaluated:Patient Re-evaluated prior to inductionOxygen Delivery Method: Circle system utilized Preoxygenation: Pre-oxygenation with 100% oxygen Intubation Type: IV induction Ventilation: Mask ventilation without difficulty LMA: LMA inserted LMA Size: 5.0 Number of attempts: 1 Placement Confirmation: positive ETCO2 and breath sounds checked- equal and bilateral Tube secured with: Tape Dental Injury: Teeth and Oropharynx as per pre-operative assessment

## 2016-02-05 NOTE — Anesthesia Preprocedure Evaluation (Addendum)
Anesthesia Evaluation  Patient identified by MRN, date of birth, ID band Patient awake    Reviewed: Allergy & Precautions, NPO status , Patient's Chart, lab work & pertinent test results  Airway Mallampati: II   Neck ROM: full    Dental  (+) Teeth Intact, Dental Advisory Given   Pulmonary shortness of breath, sleep apnea and Continuous Positive Airway Pressure Ventilation ,    breath sounds clear to auscultation       Cardiovascular hypertension, +CHF  + dysrhythmias Atrial Fibrillation  Rhythm:regular Rate:Normal  EF 20-25%   Neuro/Psych    GI/Hepatic GERD  Medicated and Controlled,  Endo/Other  obese  Renal/GU      Musculoskeletal   Abdominal   Peds  Hematology   Anesthesia Other Findings   Reproductive/Obstetrics                            Anesthesia Physical Anesthesia Plan  ASA: III  Anesthesia Plan: General   Post-op Pain Management:    Induction: Intravenous  Airway Management Planned: LMA  Additional Equipment:   Intra-op Plan:   Post-operative Plan:   Informed Consent: I have reviewed the patients History and Physical, chart, labs and discussed the procedure including the risks, benefits and alternatives for the proposed anesthesia with the patient or authorized representative who has indicated his/her understanding and acceptance.   Dental advisory given  Plan Discussed with: CRNA, Anesthesiologist and Surgeon  Anesthesia Plan Comments:        Anesthesia Quick Evaluation

## 2016-02-05 NOTE — Progress Notes (Signed)
Rt femoral veins removed and manual pressure held for 20 minutes. Tegaderm dressing applied. Reinforce activity restrictions. Bedrest starts at 1230. Awaiting TCU.

## 2016-02-05 NOTE — Anesthesia Postprocedure Evaluation (Signed)
Anesthesia Post Note  Patient: Todd [name]  Procedure(s) Performed: Procedure(s) (LRB): Atrial Fibrillation Ablation (N/A)  Patient location during evaluation: PACU Anesthesia Type: General Level of consciousness: sedated Pain management: pain level controlled Vital Signs Assessment: post-procedure vital signs reviewed and stable Respiratory status: spontaneous breathing and respiratory function stable Cardiovascular status: stable Anesthetic complications: no    Last Vitals:  Filed Vitals:   02/05/16 1235 02/05/16 1240  BP: 157/109 134/109  Pulse: 84 78  Temp:    Resp: 28 16    Last Pain: There were no vitals filed for this visit.               Ardis Fullwood DANIEL

## 2016-02-05 NOTE — Progress Notes (Signed)
Site area: LFV x2 Site Prior to Removal:  Level 0 Pressure Applied For:23 min Manual:   yes Patient Status During Pull:stable   Post Pull Site:  Level 0 Post Pull Instructions Given:yes   Post Pull Pulses Present:  Dressing Applied:  clear Bedrest begins @  Comments:BR post RFV removal

## 2016-02-05 NOTE — Transfer of Care (Signed)
Immediate Anesthesia Transfer of Care Note  Patient: Todd [name]  Procedure(s) Performed: Procedure(s): Atrial Fibrillation Ablation (N/A)  Patient Location: Cath Lab  Anesthesia Type:General  Level of Consciousness: awake, alert , oriented and patient cooperative  Airway & Oxygen Therapy: Patient Spontanous Breathing and Patient connected to nasal cannula oxygen  Post-op Assessment: Report given to RN, Post -op Vital signs reviewed and stable and Patient moving all extremities X 4  Post vital signs: Reviewed and stable  Last Vitals:  Filed Vitals:   02/05/16 0551  BP: 137/101  Pulse: 73  Temp: 36.9 C  Resp: 18    Complications: No apparent anesthesia complications

## 2016-02-06 ENCOUNTER — Encounter (HOSPITAL_COMMUNITY): Payer: Self-pay | Admitting: Cardiology

## 2016-02-06 DIAGNOSIS — I428 Other cardiomyopathies: Secondary | ICD-10-CM | POA: Diagnosis not present

## 2016-02-06 DIAGNOSIS — I481 Persistent atrial fibrillation: Secondary | ICD-10-CM | POA: Diagnosis not present

## 2016-02-06 DIAGNOSIS — I480 Paroxysmal atrial fibrillation: Secondary | ICD-10-CM | POA: Diagnosis not present

## 2016-02-06 DIAGNOSIS — I472 Ventricular tachycardia: Secondary | ICD-10-CM | POA: Diagnosis not present

## 2016-02-06 MED FILL — Lidocaine HCl Local Preservative Free (PF) Inj 1%: INTRAMUSCULAR | Qty: 30 | Status: AC

## 2016-02-06 NOTE — Discharge Instructions (Signed)
No driving for 4 days. No lifting over 5 lbs for 1 week. No vigorous or sexual activity for 1 week. You may return to work on 02/12/16. Keep procedure site clean & dry. If you notice increased pain, swelling, bleeding or pus, call/return!  You may shower, but no soaking baths/hot tubs/pools for 1 week.  ° ° ° °

## 2016-02-10 ENCOUNTER — Telehealth: Payer: Self-pay | Admitting: *Deleted

## 2016-02-10 NOTE — Telephone Encounter (Signed)
Called patient to check in on him post ablation, per Dr. Elberta Fortis. (secondary to palpitations/PVCs post ablation) Patient states he is feeling better since getting home and resting. States that PVCs/palpitations seem worse when he is tired. He hasn't felt (palpitations) much since leaving the hospital, maybe once/twice yesterday. Reports energy level better today and improving daily.   Informed Dr. Elberta Fortis of patient's response/condition.

## 2016-02-12 ENCOUNTER — Telehealth: Payer: Self-pay | Admitting: Cardiology

## 2016-02-12 NOTE — Telephone Encounter (Signed)
New message      Pt c/o BP issue: STAT if pt c/o blurred vision, one-sided weakness or slurred speech  1. What are your last 5 BP readings? 143/105 this am, bp was about the same last night 2. Are you having any other symptoms (ex. Dizziness, headache, blurred vision, passed out)? Slight dizziness  3. What is your BP issue? Pt had an ablation last thurs.  Is this bp increase normal?

## 2016-02-12 NOTE — Telephone Encounter (Signed)
Advised patient to contact PCP to address concern.   Informed that this is not related to recent ablation. Patient verbalized understanding and agreeable to plan.

## 2016-02-13 ENCOUNTER — Telehealth: Payer: Self-pay | Admitting: Cardiology

## 2016-02-13 ENCOUNTER — Encounter: Payer: Self-pay | Admitting: *Deleted

## 2016-02-13 NOTE — Telephone Encounter (Signed)
New Message  Pt requested to speak w/ RN about getting a "return-to-work" letter in order to begin working tomorrow. Please call back and discuss.

## 2016-02-13 NOTE — Telephone Encounter (Signed)
Informed patient that I would leave a letter at the front desk stating he may return to work tomorrow. He will stop by late this afternoon and thanks me for helping.

## 2016-02-20 ENCOUNTER — Telehealth: Payer: Self-pay | Admitting: Cardiology

## 2016-02-20 NOTE — Telephone Encounter (Signed)
Pt calling to see if it's normal to have lots of palpitation. Palpitations are off and on worsening x 3 days and lasting from seconds to minutes and are more noticeable in the evening. No change in SOB, no edema.  Pt stated he is still fatigued. Pt has not missed any doses of meds. He said his BP has been normal and his HR in mid 60's. Pt has F/U 4/4 with Dr Elberta Fortis. Pt was reassured it is common to go in and out of afib for several months but told to call with any further questions or concerns. Pt was told Dr Elberta Fortis was out of office and we will wait till further advice. Pt was told he will get a call back.

## 2016-02-20 NOTE — Telephone Encounter (Signed)
New message     Pt had an ablation a couple of weeks ago. For the past couple of days, his heart has been beating "hard"------pounding as if he had been exercising.  Please call

## 2016-02-24 NOTE — Telephone Encounter (Signed)
Patient reports doing better. Worked this weekend. Does still feel palpitations still but they are improved. Doesn't know whether its afib/pvcs.  Notices it more when relaxing/sitting. Feels energy level is coming back, slowly and he doesn't get a tired as quick. Offered monitor to determine heart rhythm but he would like to wain and see if he keeps improving. We will see him in office next week for f/u. He verbalized understanding and agreeable to plan.

## 2016-02-25 ENCOUNTER — Encounter: Payer: Self-pay | Admitting: Cardiology

## 2016-03-02 ENCOUNTER — Ambulatory Visit: Payer: BLUE CROSS/BLUE SHIELD | Admitting: Cardiology

## 2016-03-08 ENCOUNTER — Inpatient Hospital Stay (HOSPITAL_COMMUNITY)
Admission: RE | Admit: 2016-03-08 | Payer: BLUE CROSS/BLUE SHIELD | Source: Ambulatory Visit | Admitting: Nurse Practitioner

## 2016-03-10 ENCOUNTER — Ambulatory Visit (INDEPENDENT_AMBULATORY_CARE_PROVIDER_SITE_OTHER): Payer: BLUE CROSS/BLUE SHIELD | Admitting: Cardiology

## 2016-03-10 ENCOUNTER — Encounter: Payer: Self-pay | Admitting: Cardiology

## 2016-03-10 VITALS — BP 126/96 | HR 61 | Ht 66.0 in | Wt 233.6 lb

## 2016-03-10 DIAGNOSIS — I480 Paroxysmal atrial fibrillation: Secondary | ICD-10-CM

## 2016-03-10 NOTE — Progress Notes (Signed)
Electrophysiology Office Note   Date:  03/10/2016   ID:  Todd [name], DOB Aug 02, 1966, MRN [id number]  PCP:  Martha Clan, MD  Cardiologist:  [name] Primary Electrophysiologist:  Usman Millett [name], MD    Chief Complaint  Patient presents with  . Follow-up    post ablation     History of Present Illness: Todd [name] is a 50 y.o. male who presents today for electrophysiology evaluation.   He has a history of systolic HF with an EF of 30%. He had a cath which showed no CAD.[name] He also has OSA and is on CPAP, HTN, HLD, morbid obesity, as well as occasional PVCs. He presents today for follow-up of his atrial fibrillation. He had ablation on 02/05/16.  Since that time, he has continually improved. He says it read after the ablation very weak and fatigued, but now he is able to do most of his daily activities. He has not noticed any atrial fibrillation in this past Monday oriented 5 minutes of a rapid heart rate. He is unsure of this this was atrial fibrillation. He is having what he calls strong heart beats that occur almost daily but mainly when he is sitting still. He has not noticed any activity. He says that he can feel his heart beating in his chest, but does note any further abnormalities  Today, he denies symptoms of chest pain, orthopnea, PND, lower extremity edema, claudication, dizziness, presyncope, syncope, bleeding, or neurologic sequela. The patient is tolerating medications without difficulties and is otherwise without complaint today.    Past Medical History  Diagnosis Date  . GERD (gastroesophageal reflux disease)   . HLD (hyperlipidemia)   . OSA on CPAP   . Gout   . PVC (premature ventricular contraction)   . Dizziness   . Atypical chest pain   . IFG (impaired fasting glucose)   . Obesity   . Hypertension   . CHF (congestive heart failure) (HCC)   . Shortness of breath dyspnea    Past Surgical History  Procedure Laterality Date  . Carpal tunnel release      . Cardiac catheterization N/A 07/01/2015    Procedure: Left Heart Cath;  Surgeon: Yates Decamp, MD;  Location: [facility] INVASIVE CV LAB;  Service: Cardiovascular;  Laterality: N/A;  . Electrophysiologic study N/A 02/05/2016    Procedure: Atrial Fibrillation Ablation;  Surgeon: Jazminn Pomales [name], MD;  Location: [facility] INVASIVE CV LAB;  Service: Cardiovascular;  Laterality: N/A;     Current Outpatient Prescriptions  Medication Sig Dispense Refill  . acetaminophen (TYLENOL) 500 MG tablet Take 1,000 mg by mouth daily as needed for headache.    [name] atorvastatin (LIPITOR) 20 MG tablet Take 1 tablet (20 mg total) by mouth daily at 6 PM. 30 tablet 2  . carvedilol (COREG) 12.5 MG tablet Take 12.5 mg by mouth every morning.     . carvedilol (COREG) 25 MG tablet Take 25 mg by mouth every evening.    . Cholecalciferol (VITAMIN D) 2000 units tablet Take 2,000 Units by mouth daily.    [name] dofetilide (TIKOSYN) 500 MCG capsule Take 1 capsule (500 mcg total) by mouth every 12 (twelve) hours. 14 capsule 0  . olmesartan (BENICAR) 40 MG tablet Take 40 mg by mouth at bedtime.    . potassium chloride (K-DUR) 10 MEQ tablet Take 1 tablet (10 mEq total) by mouth 2 (two) times daily. 60 tablet 6  . rivaroxaban (XARELTO) 20 MG TABS tablet Take 1 tablet (20 mg total) by mouth  daily with supper. 30 tablet 0   No current facility-administered medications for this visit.    Allergies:   Metoprolol; Valsartan; Azithromycin; and Entresto   Social History:  The patient  reports that he has never smoked. He has never used smokeless tobacco. He reports that he drinks about 0.6 oz of alcohol per week. He reports that he does not use illicit drugs.   Family History:  The patient's family history includes COPD in his mother; Deep vein thrombosis in his mother; Diabetes in his father; Heart failure in his mother; Hypertension in his father; Lung cancer in his mother; Prostate cancer in his father; Seizures in his mother; Stroke in his paternal  grandfather. There is no history of Heart attack.    ROS:  Please see the history of present illness.   Otherwise, review of systems is positive for palpitations, DOE.   All other systems are reviewed and negative.    PHYSICAL EXAM: VS:  BP 126/96 mmHg  Pulse 61  Ht 5\' 6"  (1.676 m)  Wt 233 lb 9.6 oz (105.96 kg)  BMI 37.72 kg/m2 , BMI Body mass index is 37.72 kg/(m^2). GEN: Well nourished, well developed, in no acute distress HEENT: normal Neck: no JVD, carotid bruits, or masses Cardiac: RRR; no murmurs, rubs, or gallops,no edema  Respiratory:  clear to auscultation bilaterally, normal work of breathing GI: soft, nontender, nondistended, + BS MS: no deformity or atrophy Skin: warm and dry Neuro:  Strength and sensation are intact Psych: euthymic mood, full affect  EKG:  EKG is ordered today. ECG ordered today shows sinus rhythm, rate 61, QTc 463 Recent Labs: 12/07/2015: ALT 28; B Natriuretic Peptide 148.2* 12/10/2015: Magnesium 2.2 01/27/2016: BUN 13; Creat 0.93; Hemoglobin 13.8; Platelets 182; Potassium 4.2; Sodium 140    Lipid Panel     Component Value Date/Time   CHOL 166 12/08/2015 0352   TRIG 89 12/08/2015 0352   HDL 37* 12/08/2015 0352   CHOLHDL 4.5 12/08/2015 0352   VLDL 18 12/08/2015 0352   LDLCALC 111* 12/08/2015 0352     Wt Readings from Last 3 Encounters:  03/10/16 233 lb 9.6 oz (105.96 kg)  02/05/16 227 lb 15.3 oz (103.4 kg)  01/07/16 236 lb 9.6 oz (107.321 kg)      Other studies Reviewed: Additional studies/ records that were reviewed today include: TTE 12/08/15  Review of the above records today demonstrates:  - Left ventricle: The cavity size was mildly dilated. There was mild concentric hypertrophy. Systolic function was severely reduced. The estimated ejection fraction was in the range of 20% to 25%. Diffuse hypokinesis. Features are consistent with a pseudonormal left ventricular filling pattern, with concomitant abnormal relaxation and  increased filling pressure (grade 2 diastolic dysfunction). Doppler parameters are consistent with both elevated ventricular end-diastolic filling pressure and elevated left atrial filling pressure. - Mitral valve: There was mild to moderate regurgitation.   ASSESSMENT AND PLAN:  1.  Persistent atrial fibrillation:continues to have palpitations as well as A. Fib with RVR on his monitor. Ablation performed 02/05/15.  Has done well since that time.  He is on Xarelto for anticoagulation.    This patients CHA2DS2-VASc Score and unadjusted Ischemic Stroke Rate (% per year) is equal to 2.2 % stroke rate/year from a score of 2  Above score calculated as 1 point each if present [CHF, HTN, DM, Vascular=MI/PAD/Aortic Plaque, Age if 65-74, or Male] Above score calculated as 2 points each if present [Age > 75, or Stroke/TIA/TE]  2. Hypertension:  on coreg and Benicar well controlled.  3. Chronic congestive heart failure: I Hanzel Pizzo see him back after his echo done in June to further discuss ICD placement. This Audrina Marten be per primary prevention.  Current medicines are reviewed at length with the patient today.   The patient does not have concerns regarding his medicines.  The following changes were made today:  none  Labs/ tests ordered today include:  No orders of the defined types were placed in this encounter.     Disposition:   FU with Wilfred Siverson post ablation  Signed, Annalissa Murphey [name], MD  03/10/2016 4:27 PM     [facility] [address] [place] [address] [phone] (office) [phone] (fax)

## 2016-03-10 NOTE — Patient Instructions (Signed)
Medication Instructions:  The current medical regimen is effective;  continue present plan and medications.  Follow-Up: Follow up in 04/2015 with Dr Elberta Fortis.  If you need a refill on your cardiac medications before your next appointment, please call your pharmacy.  Thank you for choosing [facility]!!

## 2016-04-05 ENCOUNTER — Telehealth: Payer: Self-pay | Admitting: Cardiology

## 2016-04-05 NOTE — Telephone Encounter (Signed)
New Message:   Pt says he have been having little chest pains off and on,says he wants to talk you about this.Pt says he is not having any pain at this time.

## 2016-04-05 NOTE — Telephone Encounter (Signed)
He reports "shooting pains in my chest, that comes and goes" - this occurred Saturday afternoon/evening. States that last night the pain was frequent. Denies any current chest pain. Denies SOB/dizziness. He states "today is a lot better". He explains that when he was having the worst part of his pain he was very "gassy" and wondered if this had anything to do with it. We discussed chest pain vs epigastric pain and how sometimes they may feel similar. We discussed palpating pulse to see if rhythm is regular vs irregular with his hx of AFib. Patient is going to continue to monitor and call back if concerned or if chest pain returns be evaluated at local emergency room.

## 2016-05-10 ENCOUNTER — Ambulatory Visit: Payer: BLUE CROSS/BLUE SHIELD | Admitting: Cardiology

## 2016-05-27 ENCOUNTER — Telehealth: Payer: Self-pay

## 2016-05-27 ENCOUNTER — Telehealth: Payer: Self-pay | Admitting: Neurology

## 2016-05-27 NOTE — Telephone Encounter (Signed)
Robin, I have the download if you need it. Thank you for your help.

## 2016-05-27 NOTE — Telephone Encounter (Signed)
Patient called regarding CPAP, is having a lot of trouble with nose stopping up, has tried several different masks with [employer], Dr. Clelia Croft has tried nose drops. Patient doesn't feel he's getting the full benefit of his CPAP machine. Doesn't feel as rested as he thinks he should. Please call [phone].

## 2016-05-27 NOTE — Telephone Encounter (Signed)
Spoke with patient regarding cpap mask. He is coming Monday 05-31-16 for a mask fitting.

## 2016-05-31 ENCOUNTER — Telehealth: Payer: Self-pay

## 2016-05-31 NOTE — Telephone Encounter (Signed)
Patient came by for mask fitting. He is having trouble with sinus pressure with nasal pillows. [employer] gave him a fullface mask to try. His leak is over 100 with this mask. I attempted 4 different full face mask and all had leaks 90-120. I attempted several nasal pillows, P10, Pilaro, Dreamware and neither of these fit well. I tried the F&P isom medium and this had a leak of 0-5. It was the best fit. He liked this mask and wanted to try. I gave him a sample and will check download after a week to see if improved. I advised him to turn up humidifier to see if this helped with sinus.

## 2016-06-06 NOTE — Progress Notes (Signed)
Electrophysiology Office Note   Date:  06/07/2016   ID:  Todd [name], DOB Jun 25, 1966, MRN [id number]  PCP:  Martha Clan, MD  Cardiologist:  [name] Primary Electrophysiologist:  Olukemi Panchal [name], MD    Chief Complaint  Patient presents with  . Follow-up    3 months      History of Present Illness: Todd [name] is a 50 y.o. male who presents today for electrophysiology evaluation.   He has a history of systolic HF with an EF of 30%. He had a cath which showed no CAD.[name] He also has OSA and is on CPAP, HTN, HLD, morbid obesity, as well as occasional PVCs. He presents today for follow-up of his atrial fibrillation. He had ablation on 02/05/16.  He had an echocardiogram done by his cardiologist showed an EF that it improved to 43%. He still occasionally has days where he feels fatigued, but he says that overall he has been having a marked improvement.  Today, he denies symptoms of chest pain, orthopnea, PND, lower extremity edema, claudication, dizziness, presyncope, syncope, bleeding, or neurologic sequela. The patient is tolerating medications without difficulties and is otherwise without complaint today.    Past Medical History  Diagnosis Date  . GERD (gastroesophageal reflux disease)   . HLD (hyperlipidemia)   . OSA on CPAP   . Gout   . PVC (premature ventricular contraction)   . Dizziness   . Atypical chest pain   . IFG (impaired fasting glucose)   . Obesity   . Hypertension   . CHF (congestive heart failure) (HCC)   . Shortness of breath dyspnea    Past Surgical History  Procedure Laterality Date  . Carpal tunnel release    . Cardiac catheterization N/A 07/01/2015    Procedure: Left Heart Cath;  Surgeon: Yates Decamp, MD;  Location: [facility] INVASIVE CV LAB;  Service: Cardiovascular;  Laterality: N/A;  . Electrophysiologic study N/A 02/05/2016    Procedure: Atrial Fibrillation Ablation;  Surgeon: Christain Mcraney [name], MD;  Location: [facility] INVASIVE CV LAB;  Service:  Cardiovascular;  Laterality: N/A;     Current Outpatient Prescriptions  Medication Sig Dispense Refill  . acetaminophen (TYLENOL) 500 MG tablet Take 1,000 mg by mouth daily as needed for headache.    [name] atorvastatin (LIPITOR) 20 MG tablet Take 1 tablet (20 mg total) by mouth daily at 6 PM. 30 tablet 2  . carvedilol (COREG) 12.5 MG tablet Take 12.5 mg by mouth every morning.     . carvedilol (COREG) 25 MG tablet Take 25 mg by mouth every evening.    . Cholecalciferol (VITAMIN D) 2000 units tablet Take 2,000 Units by mouth daily.    [name] dofetilide (TIKOSYN) 500 MCG capsule Take 1 capsule (500 mcg total) by mouth every 12 (twelve) hours. 14 capsule 0  . eplerenone (INSPRA) 25 MG tablet Take 25 mg by mouth daily.    [name] olmesartan (BENICAR) 40 MG tablet Take 40 mg by mouth at bedtime.    [name] omeprazole (PRILOSEC) 20 MG capsule Take 20 mg by mouth daily.    . potassium chloride (K-DUR) 10 MEQ tablet Take 1 tablet (10 mEq total) by mouth 2 (two) times daily. 60 tablet 6  . rivaroxaban (XARELTO) 20 MG TABS tablet Take 1 tablet (20 mg total) by mouth daily with supper. 30 tablet 0   No current facility-administered medications for this visit.    Allergies:   Metoprolol; Valsartan; Azithromycin; and Entresto   Social History:  The patient  reports  that he has never smoked. He has never used smokeless tobacco. He reports that he drinks about 0.6 oz of alcohol per week. He reports that he does not use illicit drugs.   Family History:  The patient's family history includes COPD in his mother; Deep vein thrombosis in his mother; Diabetes in his father; Heart failure in his mother; Hypertension in his father; Lung cancer in his mother; Prostate cancer in his father; Seizures in his mother; Stroke in his paternal grandfather. There is no history of Heart attack.    ROS:  Please see the history of present illness.   Otherwise, review of systems is positive for DOE, palpitations.   All other systems are reviewed  and negative.    PHYSICAL EXAM: VS:  BP 126/92 mmHg  Pulse 68  Ht 5\' 6"  (1.676 m)  Wt 237 lb 12.8 oz (107.865 kg)  BMI 38.40 kg/m2 , BMI Body mass index is 38.4 kg/(m^2). GEN: Well nourished, well developed, in no acute distress HEENT: normal Neck: no JVD, carotid bruits, or masses Cardiac: RRR; no murmurs, rubs, or gallops,no edema  Respiratory:  clear to auscultation bilaterally, normal work of breathing GI: soft, nontender, nondistended, + BS MS: no deformity or atrophy Skin: warm and dry Neuro:  Strength and sensation are intact Psych: euthymic mood, full affect  EKG:  EKG is not ordered today.   Recent Labs: 12/07/2015: ALT 28; B Natriuretic Peptide 148.2* 12/10/2015: Magnesium 2.2 01/27/2016: BUN 13; Creat 0.93; Hemoglobin 13.8; Platelets 182; Potassium 4.2; Sodium 140    Lipid Panel     Component Value Date/Time   CHOL 166 12/08/2015 0352   TRIG 89 12/08/2015 0352   HDL 37* 12/08/2015 0352   CHOLHDL 4.5 12/08/2015 0352   VLDL 18 12/08/2015 0352   LDLCALC 111* 12/08/2015 0352     Wt Readings from Last 3 Encounters:  06/07/16 237 lb 12.8 oz (107.865 kg)  03/10/16 233 lb 9.6 oz (105.96 kg)  02/05/16 227 lb 15.3 oz (103.4 kg)      Other studies Reviewed: Additional studies/ records that were reviewed today include: TTE 12/08/15  Review of the above records today demonstrates:  - Left ventricle: The cavity size was mildly dilated. There was mild concentric hypertrophy. Systolic function was severely reduced. The estimated ejection fraction was in the range of 20% to 25%. Diffuse hypokinesis. Features are consistent with a pseudonormal left ventricular filling pattern, with concomitant abnormal relaxation and increased filling pressure (grade 2 diastolic dysfunction). Doppler parameters are consistent with both elevated ventricular end-diastolic filling pressure and elevated left atrial filling pressure. - Mitral valve: There was mild to moderate  regurgitation.  TTE 05/26/16 -Left ventricle cavity is moderately dilated at 6.5 cm. Moderate concentric LVH. Mild diffuse hypokinesis. Normal diastolic filling pattern. Ejection fraction 43% -Left atrial cavity mildly dilated -Grade 1 mitral regurgitation  ASSESSMENT AND PLAN:  1.  Persistent atrial fibrillation: AF ablation performed 02/05/16. He is feeling much improved since the ablation, and has had an increase in his ejection fraction. I have stressed to him the importance of compliance with his CPAP. He is going to ENT to be seen for nasal congestion associated with his CPAP. We Anquanette Bahner make no changes to his medications today.  This patients CHA2DS2-VASc Score and unadjusted Ischemic Stroke Rate (% per year) is equal to 2.2 % stroke rate/year from a score of 2  Above score calculated as 1 point each if present [CHF, HTN, DM, Vascular=MI/PAD/Aortic Plaque, Age if 89-74, or Male] Above  score calculated as 2 points each if present [Age > 75, or Stroke/TIA/TE]  2. Hypertension: on coreg and Benicar well controlled.  3. Chronic congestive heart failure: I Elizar Alpern see him back after his echo done in June to further discuss ICD placement. This Semiah Konczal be per primary prevention. Had a repeat echo that showed an EF of 43% and a mildly dilated left atrium. At this time, due to his improvement in his LV function, he does not require a defibrillator.  4. Obstructive sleep apnea: Treated with CPAP  Current medicines are reviewed at length with the patient today.   The patient does not have concerns regarding his medicines.  The following changes were made today:  none  Labs/ tests ordered today include:  No orders of the defined types were placed in this encounter.     Disposition:   FU with Kathren Scearce 6 months  Signed, Nuchem Grattan [name], MD  06/07/2016 11:07 AM     [facility] [address] [place] [address] [phone] (office) [phone] (fax)

## 2016-06-07 ENCOUNTER — Encounter: Payer: Self-pay | Admitting: Cardiology

## 2016-06-07 ENCOUNTER — Ambulatory Visit (INDEPENDENT_AMBULATORY_CARE_PROVIDER_SITE_OTHER): Payer: BLUE CROSS/BLUE SHIELD | Admitting: Cardiology

## 2016-06-07 VITALS — BP 126/92 | HR 68 | Ht 66.0 in | Wt 237.8 lb

## 2016-06-07 DIAGNOSIS — I480 Paroxysmal atrial fibrillation: Secondary | ICD-10-CM

## 2016-06-07 NOTE — Patient Instructions (Signed)
Medication Instructions:    Your physician recommends that you continue on your current medications as directed. Please refer to the Current Medication list given to you today.  Labwork:  None ordered  Testing/Procedures:  None ordered  Follow-Up:  Your physician wants you to follow-up in: 6 months with Dr. Camnitz.  You will receive a reminder letter in the mail two months in advance. If you don't receive a letter, please call our office to schedule the follow-up appointment.  - If you need a refill on your cardiac medications before your next appointment, please call your pharmacy.    Thank you for choosing [facility]!!   Elzabeth Mcquerry, RN [phone]         

## 2016-06-14 ENCOUNTER — Other Ambulatory Visit: Payer: Self-pay

## 2016-09-07 ENCOUNTER — Encounter (HOSPITAL_BASED_OUTPATIENT_CLINIC_OR_DEPARTMENT_OTHER): Payer: Self-pay | Admitting: *Deleted

## 2016-09-07 ENCOUNTER — Other Ambulatory Visit: Payer: Self-pay | Admitting: Otolaryngology

## 2016-09-07 NOTE — Progress Notes (Signed)
Pt's PMH and cardiology notes reviewed by Dr. Glade Stanford MDA.  Ok for surgery at [facility] 09/14/16.

## 2016-09-14 ENCOUNTER — Ambulatory Visit (HOSPITAL_BASED_OUTPATIENT_CLINIC_OR_DEPARTMENT_OTHER): Payer: BLUE CROSS/BLUE SHIELD | Admitting: Anesthesiology

## 2016-09-14 ENCOUNTER — Ambulatory Visit (HOSPITAL_BASED_OUTPATIENT_CLINIC_OR_DEPARTMENT_OTHER)
Admission: RE | Admit: 2016-09-14 | Discharge: 2016-09-15 | Disposition: A | Discharge status: Home / Self Care | Payer: BLUE CROSS/BLUE SHIELD | Source: Ambulatory Visit | Attending: Otolaryngology | Admitting: Otolaryngology

## 2016-09-14 ENCOUNTER — Encounter (HOSPITAL_BASED_OUTPATIENT_CLINIC_OR_DEPARTMENT_OTHER): Payer: Self-pay | Admitting: Anesthesiology

## 2016-09-14 ENCOUNTER — Encounter (HOSPITAL_BASED_OUTPATIENT_CLINIC_OR_DEPARTMENT_OTHER)
Admission: RE | Disposition: A | Discharge status: Home / Self Care | Payer: Self-pay | Source: Ambulatory Visit | Attending: Otolaryngology

## 2016-09-14 DIAGNOSIS — I509 Heart failure, unspecified: Secondary | ICD-10-CM | POA: Diagnosis not present

## 2016-09-14 DIAGNOSIS — J3489 Other specified disorders of nose and nasal sinuses: Secondary | ICD-10-CM | POA: Diagnosis not present

## 2016-09-14 DIAGNOSIS — J342 Deviated nasal septum: Secondary | ICD-10-CM | POA: Diagnosis not present

## 2016-09-14 DIAGNOSIS — K219 Gastro-esophageal reflux disease without esophagitis: Secondary | ICD-10-CM | POA: Insufficient documentation

## 2016-09-14 DIAGNOSIS — Z9889 Other specified postprocedural states: Secondary | ICD-10-CM

## 2016-09-14 DIAGNOSIS — I110 Hypertensive heart disease with heart failure: Secondary | ICD-10-CM | POA: Diagnosis not present

## 2016-09-14 DIAGNOSIS — G4730 Sleep apnea, unspecified: Secondary | ICD-10-CM | POA: Insufficient documentation

## 2016-09-14 DIAGNOSIS — J343 Hypertrophy of nasal turbinates: Secondary | ICD-10-CM | POA: Insufficient documentation

## 2016-09-14 HISTORY — PX: NASAL SEPTOPLASTY W/ TURBINOPLASTY: SHX2070

## 2016-09-14 SURGERY — SEPTOPLASTY, NOSE, WITH NASAL TURBINATE REDUCTION
Anesthesia: General | Laterality: Bilateral

## 2016-09-14 MED ORDER — DOFETILIDE 500 MCG PO CAPS
500.0000 ug | ORAL_CAPSULE | Freq: Two times a day (BID) | ORAL | Status: DC
  Administered 2016-09-14: 500 ug via ORAL

## 2016-09-14 MED ORDER — ATORVASTATIN CALCIUM 20 MG PO TABS
20.0000 mg | ORAL_TABLET | Freq: Every day | ORAL | Status: DC
  Administered 2016-09-14: 20 mg via ORAL

## 2016-09-14 MED ORDER — FENTANYL CITRATE (PF) 100 MCG/2ML IJ SOLN
INTRAMUSCULAR | Status: AC
  Filled 2016-09-14: qty 2

## 2016-09-14 MED ORDER — LIDOCAINE HCL (CARDIAC) 20 MG/ML IV SOLN
INTRAVENOUS | Status: DC | PRN
  Administered 2016-09-14: 50 mg via INTRAVENOUS

## 2016-09-14 MED ORDER — AMOXICILLIN 875 MG PO TABS: 875.0000 mg | ORAL_TABLET | Freq: Two times a day (BID) | ORAL | 0 refills | Status: DC

## 2016-09-14 MED ORDER — MIDAZOLAM HCL 2 MG/2ML IJ SOLN
1.0000 mg | INTRAMUSCULAR | Status: DC | PRN
  Administered 2016-09-14: 2 mg via INTRAVENOUS

## 2016-09-14 MED ORDER — OXYMETAZOLINE HCL 0.05 % NA SOLN
NASAL | Status: DC | PRN
  Administered 2016-09-14: 1 via TOPICAL

## 2016-09-14 MED ORDER — ONDANSETRON HCL 4 MG/2ML IJ SOLN
INTRAMUSCULAR | Status: AC
  Filled 2016-09-14: qty 2

## 2016-09-14 MED ORDER — ONDANSETRON HCL 4 MG/2ML IJ SOLN
INTRAMUSCULAR | Status: DC | PRN
  Administered 2016-09-14: 4 mg via INTRAVENOUS

## 2016-09-14 MED ORDER — MORPHINE SULFATE (PF) 2 MG/ML IV SOLN: 2.0000 mg | INTRAVENOUS | Status: DC | PRN

## 2016-09-14 MED ORDER — ONDANSETRON HCL 4 MG PO TABS: 4.0000 mg | ORAL_TABLET | ORAL | Status: DC | PRN

## 2016-09-14 MED ORDER — IRBESARTAN 300 MG PO TABS
300.0000 mg | ORAL_TABLET | Freq: Every day | ORAL | Status: DC
  Administered 2016-09-14: 300 mg via ORAL

## 2016-09-14 MED ORDER — LIDOCAINE-EPINEPHRINE 1 %-1:100000 IJ SOLN
INTRAMUSCULAR | Status: AC
  Filled 2016-09-14: qty 1

## 2016-09-14 MED ORDER — SCOPOLAMINE 1 MG/3DAYS TD PT72: 1.0000 | MEDICATED_PATCH | Freq: Once | TRANSDERMAL | Status: DC | PRN

## 2016-09-14 MED ORDER — OXYCODONE HCL 5 MG/5ML PO SOLN: 5.0000 mg | Freq: Once | ORAL | Status: DC | PRN

## 2016-09-14 MED ORDER — LIDOCAINE 2% (20 MG/ML) 5 ML SYRINGE
INTRAMUSCULAR | Status: AC
  Filled 2016-09-14: qty 5

## 2016-09-14 MED ORDER — ONDANSETRON HCL 4 MG/2ML IJ SOLN: 4.0000 mg | INTRAMUSCULAR | Status: DC | PRN

## 2016-09-14 MED ORDER — PROPOFOL 10 MG/ML IV BOLUS
INTRAVENOUS | Status: AC
  Filled 2016-09-14: qty 20

## 2016-09-14 MED ORDER — SUCCINYLCHOLINE CHLORIDE 200 MG/10ML IV SOSY
PREFILLED_SYRINGE | INTRAVENOUS | Status: AC
  Filled 2016-09-14: qty 10

## 2016-09-14 MED ORDER — OXYCODONE-ACETAMINOPHEN 5-325 MG PO TABS: 1.0000 | ORAL_TABLET | ORAL | 0 refills | Status: DC | PRN

## 2016-09-14 MED ORDER — OXYCODONE HCL 5 MG PO TABS: 5.0000 mg | ORAL_TABLET | Freq: Once | ORAL | Status: DC | PRN

## 2016-09-14 MED ORDER — FENTANYL CITRATE (PF) 100 MCG/2ML IJ SOLN
50.0000 ug | INTRAMUSCULAR | Status: DC | PRN
  Administered 2016-09-14: 100 ug via INTRAVENOUS
  Administered 2016-09-14: 50 ug via INTRAVENOUS

## 2016-09-14 MED ORDER — SUCCINYLCHOLINE CHLORIDE 20 MG/ML IJ SOLN
INTRAMUSCULAR | Status: DC | PRN
  Administered 2016-09-14: 140 mg via INTRAVENOUS

## 2016-09-14 MED ORDER — MIDAZOLAM HCL 2 MG/2ML IJ SOLN
INTRAMUSCULAR | Status: AC
  Filled 2016-09-14: qty 2

## 2016-09-14 MED ORDER — CARVEDILOL 25 MG PO TABS
25.0000 mg | ORAL_TABLET | Freq: Every evening | ORAL | Status: DC
  Administered 2016-09-14: 25 mg via ORAL

## 2016-09-14 MED ORDER — GLYCOPYRROLATE 0.2 MG/ML IJ SOLN: 0.2000 mg | Freq: Once | INTRAMUSCULAR | Status: DC | PRN

## 2016-09-14 MED ORDER — EPHEDRINE SULFATE 50 MG/ML IJ SOLN
INTRAMUSCULAR | Status: DC | PRN
  Administered 2016-09-14 (×2): 10 mg via INTRAVENOUS

## 2016-09-14 MED ORDER — LIDOCAINE-EPINEPHRINE 1 %-1:100000 IJ SOLN
INTRAMUSCULAR | Status: DC | PRN
  Administered 2016-09-14: 2.5 mL

## 2016-09-14 MED ORDER — OXYCODONE-ACETAMINOPHEN 5-325 MG PO TABS: 1.0000 | ORAL_TABLET | ORAL | Status: DC | PRN

## 2016-09-14 MED ORDER — DEXAMETHASONE SODIUM PHOSPHATE 4 MG/ML IJ SOLN
INTRAMUSCULAR | Status: DC | PRN
  Administered 2016-09-14: 10 mg via INTRAVENOUS

## 2016-09-14 MED ORDER — SPIRONOLACTONE 25 MG PO TABS
25.0000 mg | ORAL_TABLET | Freq: Every day | ORAL | Status: DC
  Administered 2016-09-14: 25 mg via ORAL

## 2016-09-14 MED ORDER — HYDROMORPHONE HCL 1 MG/ML IJ SOLN: 0.2500 mg | INTRAMUSCULAR | Status: DC | PRN

## 2016-09-14 MED ORDER — PROPOFOL 10 MG/ML IV BOLUS
INTRAVENOUS | Status: DC | PRN
  Administered 2016-09-14: 200 mg via INTRAVENOUS
  Administered 2016-09-14: 100 mg via INTRAVENOUS

## 2016-09-14 MED ORDER — EPHEDRINE 5 MG/ML INJ
INTRAVENOUS | Status: AC
  Filled 2016-09-14: qty 10

## 2016-09-14 MED ORDER — DEXAMETHASONE SODIUM PHOSPHATE 10 MG/ML IJ SOLN
INTRAMUSCULAR | Status: AC
  Filled 2016-09-14: qty 1

## 2016-09-14 MED ORDER — ONDANSETRON HCL 4 MG/2ML IJ SOLN: 4.0000 mg | Freq: Once | INTRAMUSCULAR | Status: DC | PRN

## 2016-09-14 MED ORDER — LACTATED RINGERS IV SOLN
INTRAVENOUS | Status: DC
  Administered 2016-09-14 (×2): via INTRAVENOUS

## 2016-09-14 MED ORDER — MEPERIDINE HCL 25 MG/ML IJ SOLN: 6.2500 mg | INTRAMUSCULAR | Status: DC | PRN

## 2016-09-14 SURGICAL SUPPLY — 34 items
ATTRACTOMAT 16X20 MAGNETIC DRP (DRAPES) IMPLANT
CANISTER SUCT 1200ML W/VALVE (MISCELLANEOUS) ×3 IMPLANT
COAGULATOR SUCT 8FR VV (MISCELLANEOUS) ×3 IMPLANT
DECANTER SPIKE VIAL GLASS SM (MISCELLANEOUS) IMPLANT
DRSG NASOPORE 8CM (GAUZE/BANDAGES/DRESSINGS) IMPLANT
DRSG TELFA 3X8 NADH (GAUZE/BANDAGES/DRESSINGS) IMPLANT
ELECT REM PT RETURN 9FT ADLT (ELECTROSURGICAL) ×3
ELECTRODE REM PT RTRN 9FT ADLT (ELECTROSURGICAL) ×1 IMPLANT
GLOVE BIO SURGEON STRL SZ 6.5 (GLOVE) ×2 IMPLANT
GLOVE BIO SURGEON STRL SZ7.5 (GLOVE) ×3 IMPLANT
GLOVE BIO SURGEONS STRL SZ 6.5 (GLOVE) ×1
GLOVE BIOGEL PI IND STRL 6.5 (GLOVE) ×1 IMPLANT
GLOVE BIOGEL PI INDICATOR 6.5 (GLOVE) ×2
GOWN STRL REUS W/ TWL LRG LVL3 (GOWN DISPOSABLE) ×2 IMPLANT
GOWN STRL REUS W/TWL LRG LVL3 (GOWN DISPOSABLE) ×4
NEEDLE HYPO 25X1 1.5 SAFETY (NEEDLE) ×3 IMPLANT
NS IRRIG 1000ML POUR BTL (IV SOLUTION) ×3 IMPLANT
PACK BASIN DAY SURGERY FS (CUSTOM PROCEDURE TRAY) ×3 IMPLANT
PACK ENT DAY SURGERY (CUSTOM PROCEDURE TRAY) ×3 IMPLANT
SLEEVE SCD COMPRESS KNEE MED (MISCELLANEOUS) IMPLANT
SOLUTION BUTLER CLEAR DIP (MISCELLANEOUS) ×3 IMPLANT
SPLINT NASAL AIRWAY SILICONE (MISCELLANEOUS) ×3 IMPLANT
SPONGE GAUZE 2X2 8PLY STER LF (GAUZE/BANDAGES/DRESSINGS) ×1
SPONGE GAUZE 2X2 8PLY STRL LF (GAUZE/BANDAGES/DRESSINGS) ×2 IMPLANT
SPONGE NEURO XRAY DETECT 1X3 (DISPOSABLE) ×3 IMPLANT
SUT CHROMIC 4 0 P 3 18 (SUTURE) ×3 IMPLANT
SUT PLAIN 4 0 ~~LOC~~ 1 (SUTURE) ×3 IMPLANT
SUT PROLENE 3 0 PS 2 (SUTURE) ×3 IMPLANT
SUT VIC AB 4-0 P-3 18XBRD (SUTURE) IMPLANT
SUT VIC AB 4-0 P3 18 (SUTURE)
TOWEL OR 17X24 6PK STRL BLUE (TOWEL DISPOSABLE) ×3 IMPLANT
TUBE SALEM SUMP 12R W/ARV (TUBING) IMPLANT
TUBE SALEM SUMP 16 FR W/ARV (TUBING) ×3 IMPLANT
YANKAUER SUCT BULB TIP NO VENT (SUCTIONS) ×3 IMPLANT

## 2016-09-14 NOTE — Anesthesia Postprocedure Evaluation (Signed)
Anesthesia Post Note  Patient: Todd [name].  Procedure(s) Performed: Procedure(s) (LRB): NASAL SEPTOPLASTY WITH BILATERAL  TURBINATE REDUCTION (Bilateral)  Patient location during evaluation: PACU Anesthesia Type: General Level of consciousness: awake and alert Pain management: pain level controlled Vital Signs Assessment: post-procedure vital signs reviewed and stable Respiratory status: spontaneous breathing, nonlabored ventilation, respiratory function stable and patient connected to nasal cannula oxygen Cardiovascular status: blood pressure returned to baseline and stable Postop Assessment: no signs of nausea or vomiting Anesthetic complications: no    Last Vitals:  Vitals:   09/14/16 1230 09/14/16 1300  BP: (!) 133/105 (!) 147/84  Pulse: (!) 56 (!) 57  Resp: (!) 26 16  Temp:  36.4 C    Last Pain:  Vitals:   09/14/16 1430  TempSrc:   PainSc: 0-No pain                 Kirstyn Lean A

## 2016-09-14 NOTE — Anesthesia Preprocedure Evaluation (Signed)
Anesthesia Evaluation  Patient identified by MRN, date of birth, ID band Patient awake    Reviewed: Allergy & Precautions, NPO status , Patient's Chart, lab work & pertinent test results, reviewed documented beta blocker date and time   Airway Mallampati: III  TM Distance: >3 FB Neck ROM: Full    Dental  (+) Teeth Intact, Dental Advisory Given   Pulmonary sleep apnea and Continuous Positive Airway Pressure Ventilation ,    breath sounds clear to auscultation       Cardiovascular hypertension, Pt. on medications and Pt. on home beta blockers +CHF   Rhythm:Regular Rate:Normal     Neuro/Psych    GI/Hepatic GERD  Medicated and Controlled,  Endo/Other    Renal/GU      Musculoskeletal   Abdominal   Peds  Hematology   Anesthesia Other Findings   Reproductive/Obstetrics                             Anesthesia Physical Anesthesia Plan  ASA: III  Anesthesia Plan: General   Post-op Pain Management:    Induction: Intravenous  Airway Management Planned: Oral ETT and Video Laryngoscope Planned  Additional Equipment:   Intra-op Plan:   Post-operative Plan: Extubation in OR  Informed Consent: I have reviewed the patients History and Physical, chart, labs and discussed the procedure including the risks, benefits and alternatives for the proposed anesthesia with the patient or authorized representative who has indicated his/her understanding and acceptance.   Dental advisory given  Plan Discussed with: CRNA, Anesthesiologist and Surgeon  Anesthesia Plan Comments:         Anesthesia Quick Evaluation

## 2016-09-14 NOTE — Discharge Instructions (Signed)

## 2016-09-14 NOTE — Transfer of Care (Signed)
Immediate Anesthesia Transfer of Care Note  Patient: Todd [name].  Procedure(s) Performed: Procedure(s): NASAL SEPTOPLASTY WITH BILATERAL  TURBINATE REDUCTION (Bilateral)  Patient Location: PACU  Anesthesia Type:General  Level of Consciousness: awake  Airway & Oxygen Therapy: Patient Spontanous Breathing and Patient connected to face mask oxygen  Post-op Assessment: Report given to RN and Post -op Vital signs reviewed and stable  Post vital signs: Reviewed and stable  Last Vitals:  Vitals:   09/14/16 1151 09/14/16 1152  BP:    Pulse: 64 67  Resp:  14  Temp: 36.8 C     Last Pain:  Vitals:   09/14/16 0954  TempSrc: Oral         Complications: No apparent anesthesia complications

## 2016-09-14 NOTE — Op Note (Signed)
DATE OF PROCEDURE: 09/14/2016  OPERATIVE REPORT   SURGEON: Newman Pies, MD   PREOPERATIVE DIAGNOSES:  1. Severe nasal septal deviation.  2. Bilateral inferior turbinate hypertrophy.  3. Chronic nasal obstruction.  POSTOPERATIVE DIAGNOSES:  1. Severe nasal septal deviation.  2. Bilateral inferior turbinate hypertrophy.  3. Chronic nasal obstruction.  PROCEDURE PERFORMED:  1. Septoplasty.  2. Bilateral partial inferior turbinate resection.   ANESTHESIA: General endotracheal tube anesthesia.   COMPLICATIONS: None.   ESTIMATED BLOOD LOSS: Approximately 300 mL.   INDICATION FOR PROCEDURE: Todd [name]. is a 50 y.o. male with a history of chronic nasal obstruction. The patient was  treated with antihistamine, decongestant, steroid nasal spray, and systemic steroids. However, the patient continues to be symptomatic. On examination, the patient was noted to have bilateral severe inferior turbinate hypertrophy and significant nasal septal deviation, causing significant nasal obstruction. Based on the above findings, the decision was made for the patient to undergo the above-stated procedure. The risks, benefits, alternatives, and details of the procedure were discussed with the patient. Questions were invited and answered. Informed consent was obtained.   DESCRIPTION OF PROCEDURE: The patient was taken to the operating room and placed supine on the operating table. General endotracheal tube anesthesia was administered by the anesthesiologist. The patient was positioned, and prepped and draped in the standard fashion for nasal surgery. Pledgets soaked with Afrin were placed in both nasal cavities for decongestion. The pledgets were subsequently removed. The above mentioned severe septal deviation was again noted. 1% lidocaine with 1:100,000 epinephrine was injected onto the nasal septum bilaterally. A hemitransfixion incision was made on the left side. The mucosal flap was carefully elevated on  the left side. A cartilaginous incision was made 1 cm superior to the caudal margin of the nasal septum. Mucosal flap was also elevated on the right side in the similar fashion. It should be noted that due to the severe septal deviation, the deviated portion of the cartilaginous and bony septum had to be removed in piecemeal fashion. Once the deviated portions were removed, a straight midline septum was achieved. The septum was then quilted with 4-0 plain gut sutures. The hemitransfixion incision was closed with interrupted 4-0 chromic sutures. Doyle splints were applied.   Prior to the [facility] splint application, the inferior one half of both hypertrophied inferior turbinate was crossclamped with a Kelly clamp. The inferior one half of each inferior turbinate was then resected with a pair of cross cutting scissors. Hemostasis was achieved with a suction cautery device.   The care of the patient was turned over to the anesthesiologist. The patient was awakened from anesthesia without difficulty. The patient was extubated and transferred to the recovery room in good condition.   OPERATIVE FINDINGS: Severe nasal septal deviation and bilateral inferior turbinate hypertrophy.   SPECIMEN: None.   FOLLOWUP CARE: The patient be discharged home once he is awake and alert. The patient will be placed on Percocet 1 tablets p.o. q.4 hours p.r.n. pain, and amoxicillin 875 mg p.o. b.i.d. for 5 days. The patient will follow up in my office in approximately 1 week for splint removal.   Litha Lamartina Philomena Doheny, MD

## 2016-09-14 NOTE — H&P (Signed)
Cc: Chronic nasal obstruction  HPI: The patient is a 50 y/o male who returns today for follow up evaluation of chronic nasal congestion. He was last seen on 06/22/2016. At that time, he was noted to have a septal deviation and bilateral turbinate hypertrophy, causing significant nasal obstruction. The patient was treated with a course of prednisone and daily Flonase. He returns today noting only a slight improvement in his nasal breathing. He is still having difficulty using his CPAP. The patient continues to use Flonase daily. No other ENT, GI, or respiratory issue noted since the last visit.   Exam: The nasal cavities were decongested and anesthetised with a combination of oxymetazoline and 4% lidocaine solution.  The flexible scope was inserted into the right nasal cavity. NSD again noted.  Endoscopy of the inferior and middle meatus was performed.  Edematous mucosa was noted.  No polyp, mass, or lesion was appreciated.  Olfactory cleft was clear.  Nasopharynx was clear.  Turbinates were hypertrophied but without mass.  Incomplete response to decongestion.  The procedure was repeated on the contralateral side with septal spur.  The patient tolerated the procedure well.  Instructions were given to avoid eating or drinking for 2 hours.    Assessment: 1.  The patient is noted to have a bidirectional septal deviation with left septal spur and bilateral inferior turbinate hypertrophy. No purulent drainage, polyps, or other suspicious mass or lesion is noted on today's nasal endoscopy.  Plan:  1.  Nasal endoscopy findings are reviewed with the patient.  2.  Treatment options include conservative observation with daily Flonase versus septoplasty and bilateral turbinate reduction. The risks, benefits, details, and alternatives of the procedure are discussed with the patient. Questions are invited and answered. 3.  The patient would like to proceed with the procedure.

## 2016-09-14 NOTE — Anesthesia Procedure Notes (Signed)
Procedure Name: Intubation Date/Time: 09/14/2016 10:33 AM Performed by: Caren Macadam Pre-anesthesia Checklist: Patient identified, Emergency Drugs available, Suction available and Patient being monitored Patient Re-evaluated:Patient Re-evaluated prior to inductionOxygen Delivery Method: Circle system utilized Preoxygenation: Pre-oxygenation with 100% oxygen Intubation Type: IV induction Ventilation: Mask ventilation without difficulty Laryngoscope Size: Glidescope Grade View: Grade I Tube type: Oral Tube size: 8.0 mm Number of attempts: 1 Airway Equipment and Method: Oral airway,  Rigid stylet and Video-laryngoscopy (elective due to exam) Placement Confirmation: ETT inserted through vocal cords under direct vision,  positive ETCO2 and breath sounds checked- equal and bilateral Secured at: 23 cm Tube secured with: Tape Dental Injury: Teeth and Oropharynx as per pre-operative assessment  Difficulty Due To: Difficult Airway- due to large tongue Future Recommendations: Recommend- induction with short-acting agent, and alternative techniques readily available

## 2016-09-15 ENCOUNTER — Encounter (HOSPITAL_BASED_OUTPATIENT_CLINIC_OR_DEPARTMENT_OTHER): Payer: Self-pay | Admitting: Otolaryngology

## 2016-09-15 NOTE — Discharge Summary (Signed)
Physician Discharge Summary  Patient ID: Todd [name]. MRN: [id number] DOB/AGE: 1965-12-18 50 y.o.  Admit date: 09/14/2016 Discharge date: 09/15/2016  Admission Diagnoses: Chronic nasal obstruction, septal deviation, turbinate hypertrophy  Discharge Diagnoses: Chronic nasal obstruction, septal deviation, turbinate hypertrophy Active Problems:   S/P nasal septoplasty   Discharged Condition: good  Hospital Course: Pt had an uneventful overnight stay. No significant bleeding. No stridor.  Consults: None  Significant Diagnostic Studies: None  Treatments: surgery: septoplasty, turbinate reduction  Discharge Exam: Blood pressure 135/86, pulse 82, temperature 98.6 F (37 C), resp. rate 16, height 5\' 6"  (1.676 m), weight 241 lb (109.3 kg), SpO2 96 %. Nasal dressing in place.  No significant bleeding.  Disposition: 01-Home or Self Care  Discharge Instructions    Activity as tolerated - No restrictions    Complete by:  As directed    Diet general    Complete by:  As directed        Medication List    STOP taking these medications   rivaroxaban 20 MG Tabs tablet Commonly known as:  XARELTO     TAKE these medications   acetaminophen 500 MG tablet Commonly known as:  TYLENOL Take 1,000 mg by mouth daily as needed for headache.   amoxicillin 875 MG tablet Commonly known as:  AMOXIL Take 1 tablet (875 mg total) by mouth 2 (two) times daily.   atorvastatin 20 MG tablet Commonly known as:  LIPITOR Take 1 tablet (20 mg total) by mouth daily at 6 PM.   carvedilol 25 MG tablet Commonly known as:  COREG Take 25 mg by mouth every evening.   dofetilide 500 MCG capsule Commonly known as:  TIKOSYN Take 1 capsule (500 mcg total) by mouth every 12 (twelve) hours.   eplerenone 25 MG tablet Commonly known as:  INSPRA Take 25 mg by mouth daily.   olmesartan 40 MG tablet Commonly known as:  BENICAR Take 40 mg by mouth at bedtime.   omeprazole 20 MG  capsule Commonly known as:  PRILOSEC Take 20 mg by mouth daily.   oxyCODONE-acetaminophen 5-325 MG tablet Commonly known as:  ROXICET Take 1 tablet by mouth every 4 (four) hours as needed for severe pain.   Vitamin D 2000 units tablet Take 2,000 Units by mouth daily.      Follow-up Information    Darletta Moll, MD On 09/17/2016.   Specialty:  Otolaryngology Why:  As scheduled Contact information: [address] [place] [address] [phone]           Signed: Darletta Moll 09/15/2016, 7:18 AM

## 2016-11-15 ENCOUNTER — Encounter: Payer: Self-pay | Admitting: Neurology

## 2016-11-16 ENCOUNTER — Telehealth: Payer: Self-pay | Admitting: Neurology

## 2016-11-16 DIAGNOSIS — Z9989 Dependence on other enabling machines and devices: Principal | ICD-10-CM

## 2016-11-16 DIAGNOSIS — G4733 Obstructive sleep apnea (adult) (pediatric): Secondary | ICD-10-CM

## 2016-11-16 NOTE — Telephone Encounter (Signed)
Pt called says he had nasal surgery 10/17. He advised he had a deviated septum and small growths in the nose. Since the surgery he says the pressure seems to be high.  Please call

## 2016-11-16 NOTE — Addendum Note (Signed)
Addended by: Huston Foley on: 11/16/2016 02:44 PM   Modules accepted: Orders

## 2016-11-16 NOTE — Telephone Encounter (Signed)
I pulled pt's compliance and therapy report for Dr. Teofilo Pod review.

## 2016-11-16 NOTE — Telephone Encounter (Signed)
I spoke to pt. Pt reports that he doesn't feel that he feels better after using cpap. He says that he feels better when he does NOT use it. He recently had nasal surgery and he thinks his pressure is too high. Pt says that his DME is [facility]. I advised him that Dr. [name] has reduced his pressure to 15 cm and he needs to make an office visit with Dr. [name]. A follow up appt was made with pt for 02/09/17 at 8:30am. Pt verbalized understanding.  Order sent to [facility].

## 2016-11-16 NOTE — Telephone Encounter (Signed)
I reviewed CPAP compliance data from 10/17/2016 through 11/15/2016, which is a total of 30 days, patient was 17% compliant, AHI 1.8 on average, leak on the high side with the 95th percentile at 24.6 L/m, pressure at 18 cm with EPR of 3. Given his recent nasal surgery and complaint of pressure feeling too high, I think we could try to reduce his pressure to 15 cm, please fax order to DME company and notify pt.

## 2016-12-08 ENCOUNTER — Other Ambulatory Visit: Payer: Self-pay | Admitting: *Deleted

## 2016-12-08 ENCOUNTER — Other Ambulatory Visit: Payer: Self-pay | Admitting: Internal Medicine

## 2016-12-08 DIAGNOSIS — R51 Headache: Principal | ICD-10-CM

## 2016-12-08 DIAGNOSIS — R519 Headache, unspecified: Secondary | ICD-10-CM

## 2016-12-09 ENCOUNTER — Ambulatory Visit
Admission: RE | Admit: 2016-12-09 | Discharge: 2016-12-09 | Disposition: A | Discharge status: Home / Self Care | Payer: BLUE CROSS/BLUE SHIELD | Source: Ambulatory Visit | Attending: Internal Medicine | Admitting: Internal Medicine

## 2016-12-09 DIAGNOSIS — R519 Headache, unspecified: Secondary | ICD-10-CM

## 2016-12-09 DIAGNOSIS — R51 Headache: Principal | ICD-10-CM

## 2016-12-24 ENCOUNTER — Other Ambulatory Visit: Payer: Self-pay | Admitting: Internal Medicine

## 2016-12-24 DIAGNOSIS — E236 Other disorders of pituitary gland: Secondary | ICD-10-CM

## 2017-01-03 ENCOUNTER — Other Ambulatory Visit: Payer: BLUE CROSS/BLUE SHIELD

## 2017-01-04 ENCOUNTER — Ambulatory Visit
Admission: RE | Admit: 2017-01-04 | Discharge: 2017-01-04 | Disposition: A | Discharge status: Home / Self Care | Payer: BLUE CROSS/BLUE SHIELD | Source: Ambulatory Visit | Attending: Internal Medicine | Admitting: Internal Medicine

## 2017-01-04 DIAGNOSIS — E236 Other disorders of pituitary gland: Secondary | ICD-10-CM

## 2017-02-07 ENCOUNTER — Encounter: Payer: Self-pay | Admitting: Neurology

## 2017-02-09 ENCOUNTER — Encounter: Payer: Self-pay | Admitting: Neurology

## 2017-02-09 ENCOUNTER — Ambulatory Visit (INDEPENDENT_AMBULATORY_CARE_PROVIDER_SITE_OTHER): Payer: BLUE CROSS/BLUE SHIELD | Admitting: Neurology

## 2017-02-09 VITALS — BP 138/88 | HR 60 | Resp 18 | Ht 66.0 in | Wt 246.0 lb

## 2017-02-09 DIAGNOSIS — Z9989 Dependence on other enabling machines and devices: Secondary | ICD-10-CM

## 2017-02-09 DIAGNOSIS — G4733 Obstructive sleep apnea (adult) (pediatric): Secondary | ICD-10-CM | POA: Diagnosis not present

## 2017-02-09 DIAGNOSIS — Z789 Other specified health status: Secondary | ICD-10-CM | POA: Diagnosis not present

## 2017-02-09 NOTE — Progress Notes (Signed)
Subjective:    Patient ID: Todd [name]. is a 52 y.o. male.  HPI     Interim history:   Todd [name] is a 51 year old right-handed gentleman with an underlying medical history of reflux disease, hyperlipidemia, gout, PVCs, dizziness, impaired fasting glucose, hypertension and obesity, as well as recent chest pain, who presents for follow-up consultation of his OSA after a long gap of nearly 2 years. The patient is unaccompanied today. I first met him on 05/27/2015 at the request of his cardiologist, at which time the patient reported a prior diagnosis of OSA. He reported difficulty with CPAP in the past and he stopped using CPAP several years prior. I invited him for sleep study. He had a baseline sleep study, followed by a CPAP titration study. I went over his test results with him in detail today. His baseline sleep study from 06/24/2015 showed a sleep efficiency of 44% only, sleep latency was prolonged at 40.5 minutes and wake after sleep onset was high at 3 hours. He had severe sleep fragmentation. He had absence of slow-wave sleep and absence of REM sleep. He had frequent PVCs and PACs on EKG. He had mild to moderate snoring. Total AHI was highly elevated at 83.3 per hour, average oxygen saturation was 92%, nadir was 82% during non-REM sleep. He had no significant PLMS. Based on his medical history and his test results I invited him for a full night CPAP titration study. He had this on 07/17/2015. Sleep efficiency was 61.1%, sleep latency was 72 minutes, wake after sleep onset was 82 minutes. He had a normal percentage of slow-wave sleep and a mildly reduced percentage of REM sleep at 15.9% with a prolonged REM latency of 174 minutes. He had frequent PVCs and PACs on single-lead EKG. CPAP was titrated from 4 cm to 18 cm, AHI was reduced to 3.8 per hour at the final pressure. Supine REM sleep was achieved. Average oxygen saturation was 94%, nadir was 78%. O2 nadir was 92% during supine REM sleep  on the final pressure of CPAP. He was fitted with a full facemask. Based on his test results are prescribed CPAP therapy for home use. He did not return for follow-up since then.  Today, 02/09/2017 (all dictated new, as well as above notes, some dictation done in note pad or Word, outside of chart, may appear as copied):  I reviewed his CPAP compliance data from 01/09/2017 through 02/07/2017 which is a total of 30 days, during which time he used his CPAP only 21 days with percent used days greater than 4 hours at 20% only, indicating poor compliance with an average usage of 3 hours and 15 minutes only, residual AHI 3.1 per hour, leak acceptable with the 95th percentile at 7.9 L/m on a pressure of 15 cm with EPR of 3. He reports that he has still trouble adjusting to CPAP. The pressure currently does not bother him, he had a total for talking with a nasal pillows, especially after his surgery and uses a nasal mask now. His sleep is restless, he has a tendency to miss with the mask take it off in the middle of the night, not realizing it. When he is awakened realizes that he pulled off his mask or if he goes to the bathroom and comes back he tries to put the mask back on. He has skipped several nights in the past 30 and in the past 90 days, he uses the machine about 70% of the time but most  nights averaging less than 4 hours. He does report feeling tired during the day, he works 3 12-hour shifts on weekend nights and during the work week he is able to take care of his daughter who is 5. His wife works during the week, day shift. He had in the interim nasal surgery under Dr. [name]. He is status post septoplasty and turbinate reduction on 09/14/2016.  He is had a cold off and on, overall nasal congestion and nasal breathing are much improved after nasal surgery. He tries to use the reference that he was given by the ENT surgeon.   The patient's allergies, current medications, family history, past medical history,  past social history, past surgical history and problem list were reviewed and updated as appropriate.   Previously (copied from previous notes for reference):   05/27/2015: He was previously diagnosed with obstructive sleep apnea. Prior test results are not available for my review. He reports a diagnosis of severe obstructive sleep apnea. He was using CPAP in the past but it was difficult for him to use it. He stopped using it a few years ago. He has gained weight. He has had some palpitations and chest pain. He had recent further testing in the form of echocardiogram and a nuclear stress test and has an appointment for follow-up this week with you to discuss test results as I understand. He works night shifts on Fridays, Saturdays and Sundays, 12 hours, from 7 PM to 7 AM. On Monday mornings he tries to nap around lunchtime and then go to bed at night. His nighttime bedtime is around 11 or 11:30 PM. He is a loud snorer and has apneic pauses while asleep and wakes up with a sense of gasping for air. He denies morning headaches but has nocturia once or twice on an average night. He denies restless leg symptoms or leg twitching at night. He is not aware of any family history of sleep apnea. He has some stress lately because of his parents health. His mother is 42 and is in end-stage lung cancer. His father is 34 years old and recently fell and broke his hip and had surgery today. His Epworth sleepiness score is 11 out of 24, fatigue score is 25 out of 63. He is a nonsmoker. He drinks caffeine occasionally but does not have to have it daily. He drinks approximately 1 beer a week.   His Past Medical History Is Significant For: Past Medical History:  Diagnosis Date  . Atypical chest pain   . CHF (congestive heart failure) ([place])   . Dizziness   . GERD (gastroesophageal reflux disease)   . Gout   . HLD (hyperlipidemia)   . Hypertension   . IFG (impaired fasting glucose)   . Obesity   . OSA on CPAP   . PVC  (premature ventricular contraction)   . Shortness of breath dyspnea     His Past Surgical History Is Significant For: Past Surgical History:  Procedure Laterality Date  . CARDIAC CATHETERIZATION N/A 07/01/2015   Procedure: Left Heart Cath;  Surgeon: Adrian Prows, MD;  Location: [facility];  Service: Cardiovascular;  Laterality: N/A;  . CARPAL TUNNEL RELEASE    . ELECTROPHYSIOLOGIC STUDY N/A 02/05/2016   Procedure: Atrial Fibrillation Ablation;  Surgeon: Will Meredith Leeds, MD;  Location: [facility];  Service: Cardiovascular;  Laterality: N/A;  . NASAL SEPTOPLASTY W/ TURBINOPLASTY Bilateral 09/14/2016   Procedure: NASAL SEPTOPLASTY WITH BILATERAL  TURBINATE REDUCTION;  Surgeon: Leta Baptist,  MD;  Location: [facility];  Service: ENT;  Laterality: Bilateral;    His Family History Is Significant For: Family History  Problem Relation Age of Onset  . Seizures Mother   . Deep vein thrombosis Mother   . COPD Mother   . Heart failure Mother   . Lung cancer Mother   . Prostate cancer Father   . Hypertension Father   . Diabetes Father   . Stroke Paternal Grandfather   . Heart attack Neg Hx     His Social History Is Significant For: Social History   Social History  . Marital status: Married    Spouse name: Lauro Regulus  . Number of children: 1  . Years of education: HS   Occupational History  . [employer]    Social History Main Topics  . Smoking status: Never Smoker  . Smokeless tobacco: Never Used  . Alcohol use 0.6 oz/week    1 Glasses of wine per week     Comment: Rare occasion  . Drug use: No  . Sexual activity: Yes   Other Topics Concern  . None   Social History Narrative   Drinks 1 Mt Dew 2 days a week     His Allergies Are:  Allergies  Allergen Reactions  . Metoprolol Other (See Comments)    Lethargy, didn't feel like doing anything (tolerates low dose carvedilol)  . Valsartan Other (See Comments)    Increased blood pressure  . Azithromycin Itching  and Other (See Comments)    Tingling in fingers  . Entresto [Sacubitril-Valsartan] Rash and Other (See Comments)    Tingling in fingers/ redness & sensitivity at tips of fingers  :   His Current Medications Are:  Outpatient Encounter Prescriptions as of 02/09/2017  Medication Sig  . acetaminophen (TYLENOL) 500 MG tablet Take 1,000 mg by mouth daily as needed for headache.  [name] atorvastatin (LIPITOR) 20 MG tablet Take 1 tablet (20 mg total) by mouth daily at 6 PM.  . carvedilol (COREG) 25 MG tablet Take 25 mg by mouth every evening.  . Cholecalciferol (VITAMIN D) 2000 units tablet Take 2,000 Units by mouth daily.  [name] dofetilide (TIKOSYN) 500 MCG capsule Take 1 capsule (500 mcg total) by mouth every 12 (twelve) hours.  [name] eplerenone (INSPRA) 25 MG tablet Take 25 mg by mouth daily.  [name] olmesartan (BENICAR) 40 MG tablet Take 40 mg by mouth at bedtime.  [name] omeprazole (PRILOSEC) 20 MG capsule Take 20 mg by mouth daily.  . [DISCONTINUED] oxyCODONE-acetaminophen (ROXICET) 5-325 MG tablet Take 1 tablet by mouth every 4 (four) hours as needed for severe pain.  . [DISCONTINUED] amoxicillin (AMOXIL) 875 MG tablet Take 1 tablet (875 mg total) by mouth 2 (two) times daily.   No facility-administered encounter medications on file as of 02/09/2017.   :  Review of Systems:  Out of a complete 14 point review of systems, all are reviewed and negative with the exception of these symptoms as listed below: Review of Systems  Neurological:       Patient states that he sleeps ok with CPAP.   Reports having daytime fatigue, will fall asleep when sitting still.  Unknown if pressure change has made a difference. He recently switched to a more comfortable mask.     Objective:  Neurologic Exam  Physical Exam Physical Examination:   Vitals:   02/09/17 0820  BP: 138/88  Pulse: 60  Resp: 18    General Examination: The patient is a very pleasant  51 y.o. male in no acute distress. He appears well-developed and  well-nourished and well groomed.   HEENT: Normocephalic, atraumatic, pupils are equal, round and reactive to light and accommodation.  Extraocular tracking is good without limitation to gaze excursion or nystagmus noted. Normal smooth pursuit is noted. Hearing is grossly intact. Face is symmetric with normal facial animation and normal facial sensation. Speech is clear with no dysarthria noted. There is no hypophonia. There is no lip, neck/head, jaw or voice tremor. Neck is supple with full range of passive and active motion. There are no carotid bruits on auscultation. Oropharynx exam reveals: mild mouth dryness, adequate dental hygiene and marked airway crowding. Mallampati is class III. Tongue protrudes centrally and palate elevates symmetrically.   Chest: Clear to auscultation without wheezing, rhonchi or crackles noted.  Heart: S1+S2+0, regular and normal without murmurs, rubs or gallops noted.   Abdomen: Soft, non-tender and non-distended with normal bowel sounds appreciated on auscultation.  Extremities: There is no pitting edema in the distal lower extremities bilaterally. Pedal pulses are intact.  Skin: Warm and dry without trophic changes noted.  Musculoskeletal: exam reveals no obvious joint deformities, tenderness or joint swelling or erythema.   Neurologically:  Mental status: The patient is awake, alert and oriented in all 4 spheres. His immediate and remote memory, attention, language skills and fund of knowledge are appropriate. There is no evidence of aphasia, agnosia, apraxia or anomia. Speech is clear with normal prosody and enunciation. Thought process is linear. Mood is normal and affect is normal.  Cranial nerves II - XII are as described above under HEENT exam. In addition: shoulder shrug is normal with equal shoulder height noted. Motor exam: Normal bulk, strength and tone is noted. There is no drift, tremor or rebound. Romberg is negative. Reflexes are 1+ throughout. Fine  motor skills and coordination: intact with normal finger taps, normal hand movements, normal rapid alternating patting, normal foot taps and normal foot agility.  Cerebellar testing: No dysmetria or intention tremor on finger to nose testing. There is no truncal or gait ataxia.  Sensory exam: intact to light touch in the upper and lower extremities.  Gait, station and balance: He stands easily. No veering to one side is noted. No leaning to one side is noted. Posture is age-appropriate and stance is narrow based. Gait shows normal stride length and normal pace. No problems turning are noted. Tandem walk is slightly challenging for him.   Assessment and Plan:  In summary, Cannen Dupras. is a very pleasant 51 y.o.-year old male with An underlying medical history of PVCs, hyperlipidemia, gout, obesity, prediabetes, hypertension and nasal congestion, status post recent nasal turbinate reduction and septoplasty in October 2017, who presents for follow-up consultation of his severe obstructive sleep apnea after a long gap. He had sleep study testing in July and August 2016 and we talked about his test results in detail today. We also reviewed his 30 day a 90 day compliance data. Overall, he is significantly suboptimal with his compliance. He is trying to adjust the treatment and is commended for this, he is reassured that when he is on CPAP, his sleep apnea is under good control and mask fitting is good. He is not bothered by the pressure per se. He is advised to try to be more consistent with his usage, for sleep initiation and sleep maintenance issues he is advised to try melatonin. He notes that sometimes he drinks a little bit of alcohol right before bedtime  which helps him fall asleep. She is advised against using alcohol as a sleep aid as it can actually disrupt sleep and worsen sleep apnea in the later part of the night. He is advised to also talk to his DME company about trying another kind of nasal  mask. Overall, findings are supportive that sleep apnea is well treated when he uses CPAP. He is encouraged towards full compliance, I would like to have him come back in about 3 months for recheck as to his symptoms and his compliance, he can see one of our nurse practitioners at the time and I will see him back after that, hopefully once he is established well and treatment we can see him on a yearly basis. He is reminded to try to work on weight loss. When I first met him he was heavier than today but had lost quite a bit of weight and then regaining most of it back.  I explained the importance of being compliant with PAP treatment, not only for insurance purposes but primarily to improve His symptoms, and for the patient's long term health benefit, including to reduce His cardiovascular risks. I answered all his questions today and he was in agreement with the plan. I spent 30 minutes in total face-to-face time with the patient, more than 50% of which was spent in counseling and coordination of care, reviewing test results, reviewing medication and discussing or reviewing the diagnosis of OSA, its prognosis and treatment options. Pertinent laboratory and imaging test results that were available during this visit with the patient were reviewed by me and considered in my medical decision making (see chart for details).

## 2017-02-09 NOTE — Patient Instructions (Addendum)
Please continue using your CPAP regularly. While your insurance requires that you use CPAP at least 4 hours each night on 70% of the nights, I recommend, that you not skip any nights and use it throughout the night if you can. Getting used to CPAP and staying with the treatment long term does take time and patience and discipline. Untreated obstructive sleep apnea when it is moderate to severe can have an adverse impact on cardiovascular health and raise her risk for heart disease, arrhythmias, hypertension, congestive heart failure, stroke and diabetes. Untreated obstructive sleep apnea causes sleep disruption, nonrestorative sleep, and sleep deprivation. This can have an impact on your day to day functioning and cause daytime sleepiness and impairment of cognitive function, memory loss, mood disturbance, and problems focussing. Using CPAP regularly can improve these symptoms.  Let's do a 3 mon follow up with one of our NPs, try to not skip any nights, try to put mask back on when dislodged or when you realize you have taken it off.    You can try Melatonin at night for sleep: take 1 mg to 3 mg, one to 2 hours before your bedtime. You can go up to 5 mg if needed. It is over the counter and comes in pill form, chewable form and spray, if you prefer.

## 2017-03-07 ENCOUNTER — Encounter: Payer: Self-pay | Admitting: Interventional Cardiology

## 2017-03-08 ENCOUNTER — Encounter: Payer: Self-pay | Admitting: Cardiology

## 2017-03-08 ENCOUNTER — Ambulatory Visit (INDEPENDENT_AMBULATORY_CARE_PROVIDER_SITE_OTHER): Payer: BLUE CROSS/BLUE SHIELD | Admitting: Cardiology

## 2017-03-08 VITALS — BP 120/86 | HR 63 | Ht 66.0 in | Wt 252.0 lb

## 2017-03-08 DIAGNOSIS — I480 Paroxysmal atrial fibrillation: Secondary | ICD-10-CM

## 2017-03-08 NOTE — Patient Instructions (Signed)
Medication Instructions:    Your physician recommends that you continue on your current medications as directed. Please refer to the Current Medication list given to you today.  --- If you need a refill on your cardiac medications before your next appointment, please call your pharmacy. ---  Labwork:  None ordered  Testing/Procedures:  None ordered  Follow-Up:  Your physician wants you to follow-up in: 6 months with Dr. Camnitz.  You will receive a reminder letter in the mail two months in advance. If you don't receive a letter, please call our office to schedule the follow-up appointment.  Any Other Special Instructions Will Be Listed Below (If Applicable)  Thank you for choosing [facility]!!   Kippy Melena, RN [phone]         

## 2017-03-08 NOTE — Progress Notes (Signed)
Electrophysiology Office Note   Date:  03/08/2017   ID:  Todd [name]., DOB [birth year], MRN [id number]  PCP:  Martha Clan, MD  Cardiologist:  [name] Primary Electrophysiologist:  Santhosh Gulino [name], MD    Chief Complaint  Patient presents with  . Follow-up    PAF     History of Present Illness: Todd [name]. is a 51 y.o. male who presents today for electrophysiology evaluation.   He has a history of systolic HF with an EF of 30%. He had a cath which showed no CAD.[name] He also has OSA and is on CPAP, HTN, HLD, morbid obesity, as well as occasional PVCs. He presents today for follow-up of his atrial fibrillation. He had ablation on 02/05/16.  He had an echocardiogram done by his cardiologist showed an EF that it improved to 43%. He is having more good days and bad. He is working on being more compliant with CPAP. He does say that he takes it off at night at times without knowing. More recently though, he has been using it for 6-8 hours per night. He is much less fatigued when he has been compliant with CPAP.  Today, he denies symptoms of chest pain, orthopnea, PND, lower extremity edema, claudication, dizziness, presyncope, syncope, bleeding, or neurologic sequela. The patient is tolerating medications without difficulties and is otherwise without complaint today.    Past Medical History:  Diagnosis Date  . Atypical chest pain   . CHF (congestive heart failure) (HCC)   . Dizziness   . GERD (gastroesophageal reflux disease)   . Gout   . HLD (hyperlipidemia)   . Hypertension   . IFG (impaired fasting glucose)   . Obesity   . OSA on CPAP   . PVC (premature ventricular contraction)   . Shortness of breath dyspnea    Past Surgical History:  Procedure Laterality Date  . CARDIAC CATHETERIZATION N/A 07/01/2015   Procedure: Left Heart Cath;  Surgeon: Yates Decamp, MD;  Location: [facility] INVASIVE CV LAB;  Service: Cardiovascular;  Laterality: N/A;  . CARPAL TUNNEL RELEASE    .  ELECTROPHYSIOLOGIC STUDY N/A 02/05/2016   Procedure: Atrial Fibrillation Ablation;  Surgeon: Zayde Stroupe [name], MD;  Location: [facility] INVASIVE CV LAB;  Service: Cardiovascular;  Laterality: N/A;  . NASAL SEPTOPLASTY W/ TURBINOPLASTY Bilateral 09/14/2016   Procedure: NASAL SEPTOPLASTY WITH BILATERAL  TURBINATE REDUCTION;  Surgeon: Newman Pies, MD;  Location: [facility];  Service: ENT;  Laterality: Bilateral;     Current Outpatient Prescriptions  Medication Sig Dispense Refill  . acetaminophen (TYLENOL) 500 MG tablet Take 1,000 mg by mouth daily as needed for headache.    [name] atorvastatin (LIPITOR) 20 MG tablet Take 1 tablet (20 mg total) by mouth daily at 6 PM. 30 tablet 2  . carvedilol (COREG) 25 MG tablet Take 25 mg by mouth 2 (two) times daily with a meal.    . Cholecalciferol (VITAMIN D) 2000 units tablet Take 2,000 Units by mouth daily.    [name] dofetilide (TIKOSYN) 500 MCG capsule Take 1 capsule (500 mcg total) by mouth every 12 (twelve) hours. 14 capsule 0  . eplerenone (INSPRA) 25 MG tablet Take 25 mg by mouth daily.    [name] olmesartan (BENICAR) 40 MG tablet Take 40 mg by mouth at bedtime.    [name] omeprazole (PRILOSEC) 20 MG capsule Take 20 mg by mouth daily.    . rivaroxaban (XARELTO) 20 MG TABS tablet Take 20 mg by mouth daily.  No current facility-administered medications for this visit.     Allergies:   Metoprolol; Valsartan; Azithromycin; and Entresto [sacubitril-valsartan]   Social History:  The patient  reports that he has never smoked. He has never used smokeless tobacco. He reports that he drinks about 0.6 oz of alcohol per week . He reports that he does not use drugs.   Family History:  The patient's family history includes COPD in his mother; Deep vein thrombosis in his mother; Diabetes in his father; Heart failure in his mother; Hypertension in his father; Lung cancer in his mother; Prostate cancer in his father; Seizures in his mother; Stroke in his paternal grandfather.      ROS:  Please see the history of present illness.   Otherwise, review of systems is positive for palpitations, DOE, back pain.   All other systems are reviewed and negative.    PHYSICAL EXAM: VS:  BP 120/86   Pulse 63   Ht 5\' 6"  (1.676 m)   Wt 252 lb (114.3 kg)   BMI 40.67 kg/m  , BMI Body mass index is 40.67 kg/m. GEN: Well nourished, well developed, in no acute distress  HEENT: normal  Neck: no JVD, carotid bruits, or masses Cardiac: RRR; no murmurs, rubs, or gallops,no edema  Respiratory:  clear to auscultation bilaterally, normal work of breathing GI: soft, nontender, nondistended, + BS MS: no deformity or atrophy  Skin: warm and dry Neuro:  Strength and sensation are intact Psych: euthymic mood, full affect  EKG:  EKG is ordered today. Personal review of the ECG ordered shows sinus rhythm, nonspecific T wave changes, QTc 477, rate 62  Recent Labs: No results found for requested labs within last 8760 hours.    Lipid Panel     Component Value Date/Time   CHOL 166 12/08/2015 0352   TRIG 89 12/08/2015 0352   HDL 37 (L) 12/08/2015 0352   CHOLHDL 4.5 12/08/2015 0352   VLDL 18 12/08/2015 0352   LDLCALC 111 (H) 12/08/2015 0352     Wt Readings from Last 3 Encounters:  03/08/17 252 lb (114.3 kg)  02/09/17 246 lb (111.6 kg)  09/14/16 241 lb (109.3 kg)      Other studies Reviewed: Additional studies/ records that were reviewed today include: TTE 12/08/15  Review of the above records today demonstrates:  - Left ventricle: The cavity size was mildly dilated. There was mild concentric hypertrophy. Systolic function was severely reduced. The estimated ejection fraction was in the range of 20% to 25%. Diffuse hypokinesis. Features are consistent with a pseudonormal left ventricular filling pattern, with concomitant abnormal relaxation and increased filling pressure (grade 2 diastolic dysfunction). Doppler parameters are consistent with both elevated  ventricular end-diastolic filling pressure and elevated left atrial filling pressure. - Mitral valve: There was mild to moderate regurgitation.  TTE 05/26/16 -Left ventricle cavity is moderately dilated at 6.5 cm. Moderate concentric LVH. Mild diffuse hypokinesis. Normal diastolic filling pattern. Ejection fraction 43% -Left atrial cavity mildly dilated -Grade 1 mitral regurgitation  ASSESSMENT AND PLAN:  1.  Persistent atrial fibrillation: Currently on Xarelto. AF ablation performed 02/05/16. He is feeling much improved since the ablation, and has had an increase in his ejection fraction. No change in medications today. continue current management.  This patients CHA2DS2-VASc Score and unadjusted Ischemic Stroke Rate (% per year) is equal to 2.2 % stroke rate/year from a score of 2  Above score calculated as 1 point each if present [CHF, HTN, DM, Vascular=MI/PAD/Aortic Plaque, Age if 94-74,  or Male] Above score calculated as 2 points each if present [Age > 75, or Stroke/TIA/TE]  2. Hypertension: on coreg and Benicar well controlled.  3. Chronic congestive heart failure: Ejection fraction is improved after ablation. Appears to be well compensated. No changes made today.  4. Obstructive sleep apnea: Treated with CPAP. Saw ENT and had a septoplasty October 2017. Is working on his CPAP compliance.  Current medicines are reviewed at length with the patient today.   The patient does not have concerns regarding his medicines.  The following changes were made today:  none  Labs/ tests ordered today include:  No orders of the defined types were placed in this encounter.    Disposition:   FU with Riker Collier 6 months  Signed, Alea Ryer [name], MD  03/08/2017 3:49 PM     [facility] [address] [place] [address] [phone] (office) [phone] (fax)

## 2017-04-03 IMAGING — CT CT HEAD W/O CM
1 series · 16 of 30 positions shown, 20 images · non-contrast
Comparison: None

CLINICAL DATA: Fell in snow striking RIGHT parietal region of head
2 weeks ago, persistent headaches and dizziness, history
hypertension, CHF

EXAM:
CT HEAD WITHOUT CONTRAST
TECHNIQUE: Contiguous axial images were obtained from the base of the skull
through the vertex without intravenous contrast. Sagittal and
coronal MPR images reconstructed from axial data set.

[Series 2: head w/(date) · axial · 0.42mm/px · z∈[-57,+78]mm · 16 of 31 slices shown, 20 images]
[im 2/31  brain]
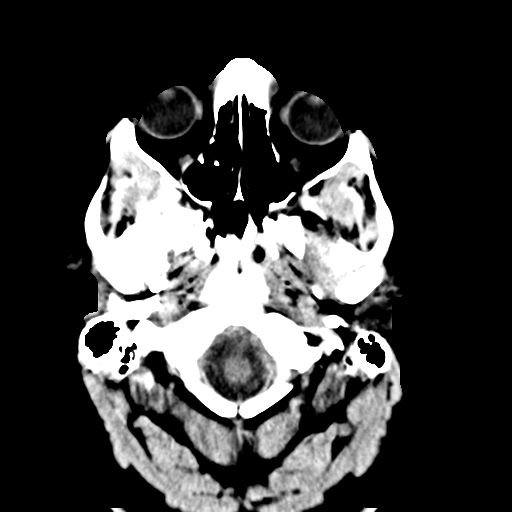
[im 2/31  bone]
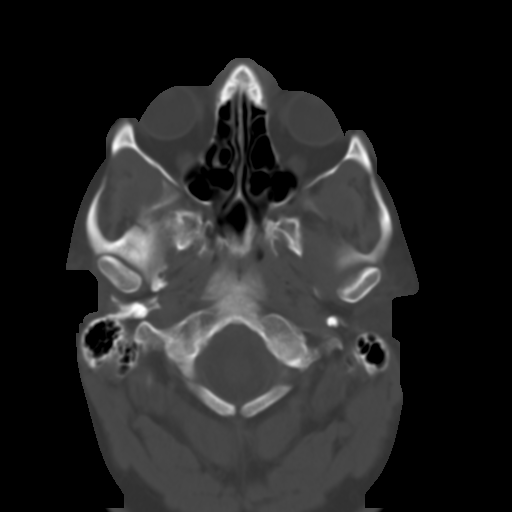
[im 4/31  brain]
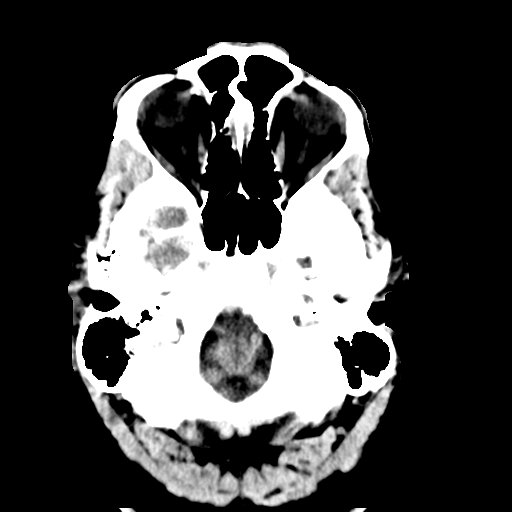
[im 6/31  brain]
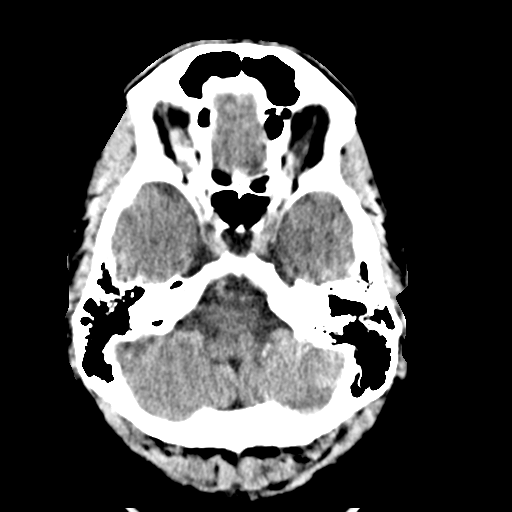
[im 8/31  brain]
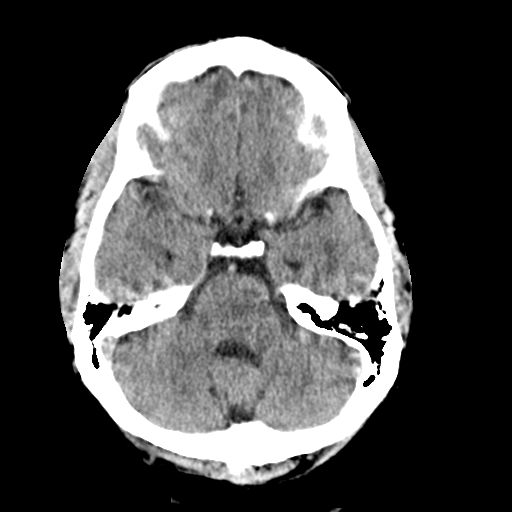
[im 9/31  brain]
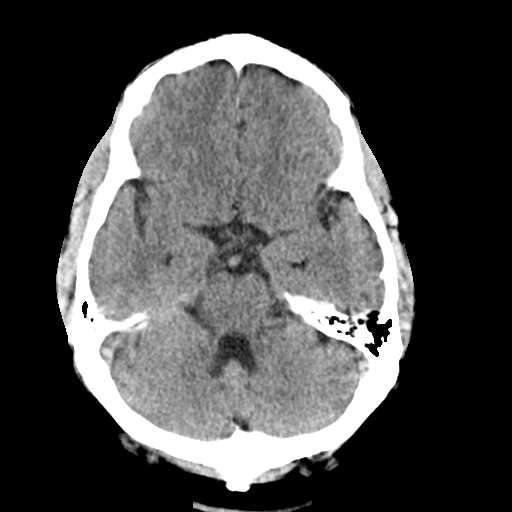
[im 9/31  bone]
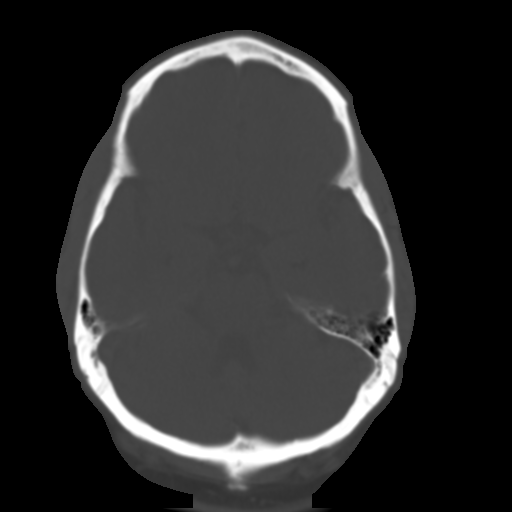
[im 11/31  brain]
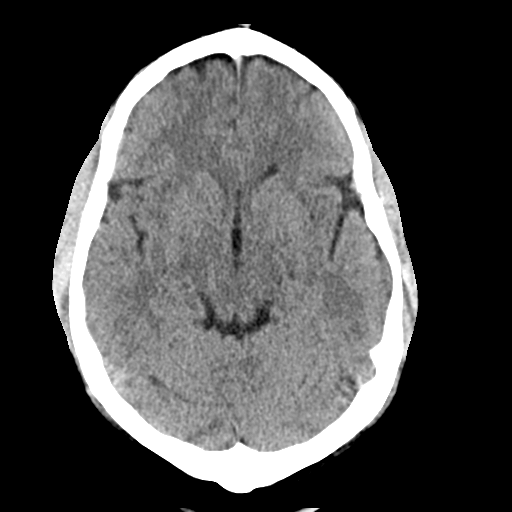
[im 13/31  brain]
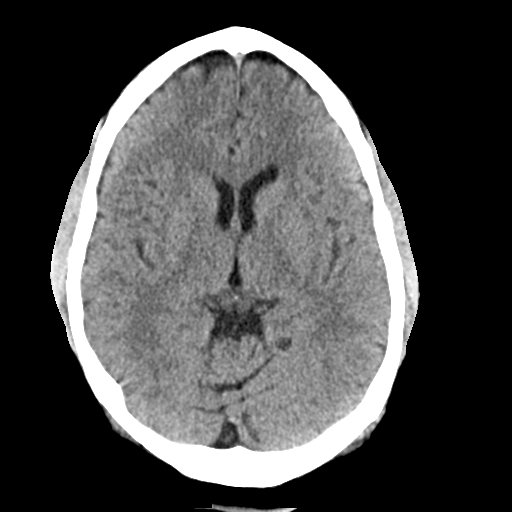
[im 15/31  brain]
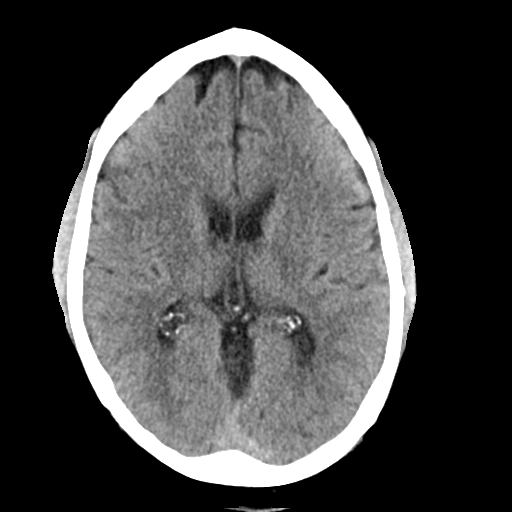
[im 16/31  brain]
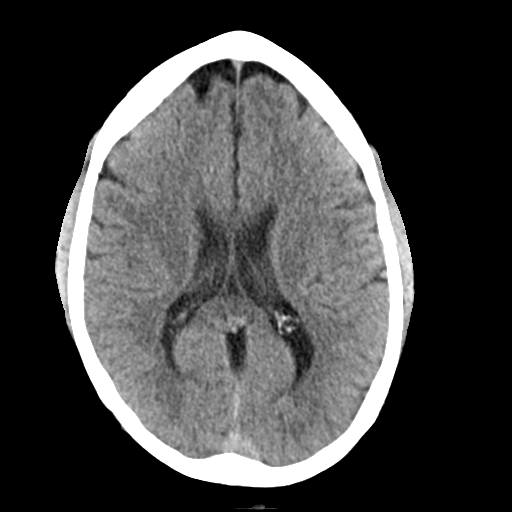
[im 16/31  bone]
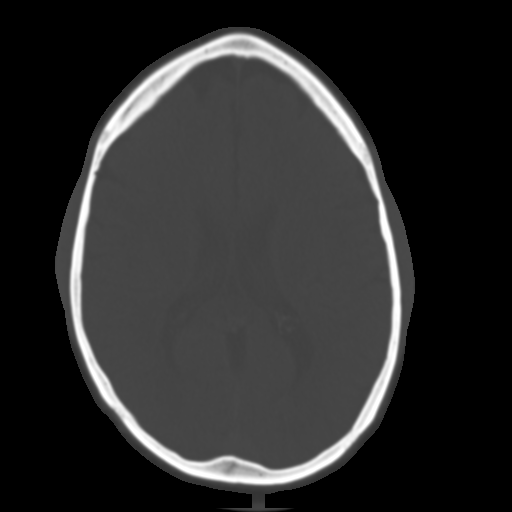
[im 18/31  brain]
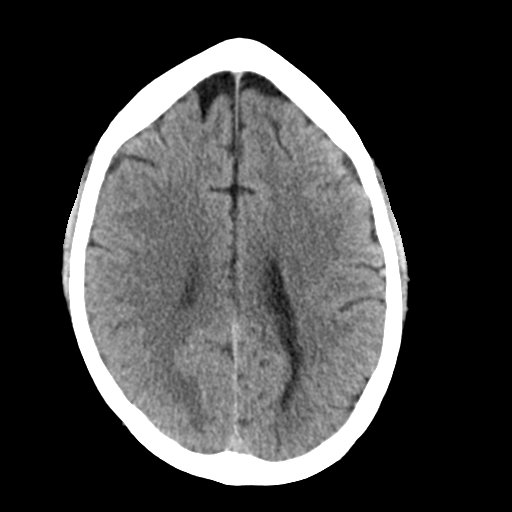
[im 20/31  brain]
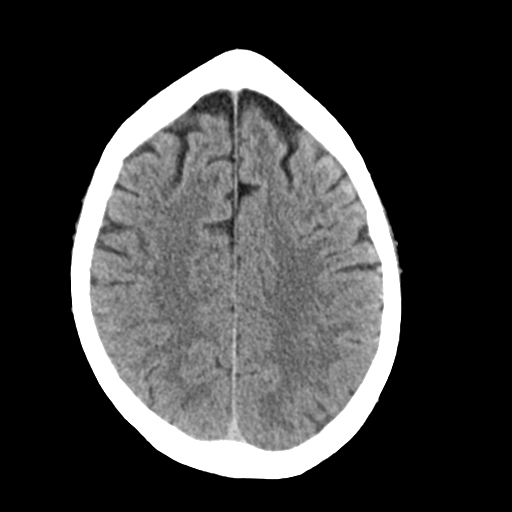
[im 22/31  brain]
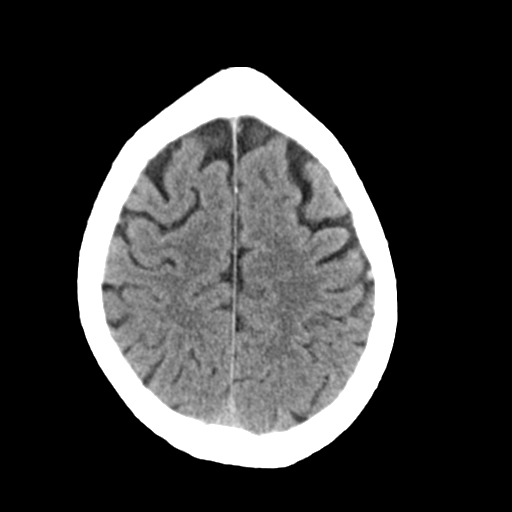
[im 23/31  brain]
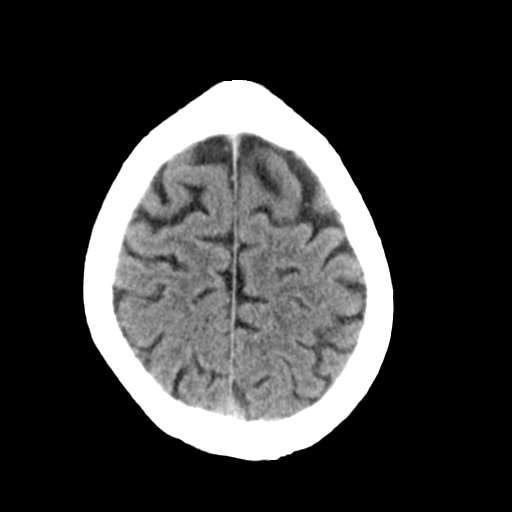
[im 23/31  bone]
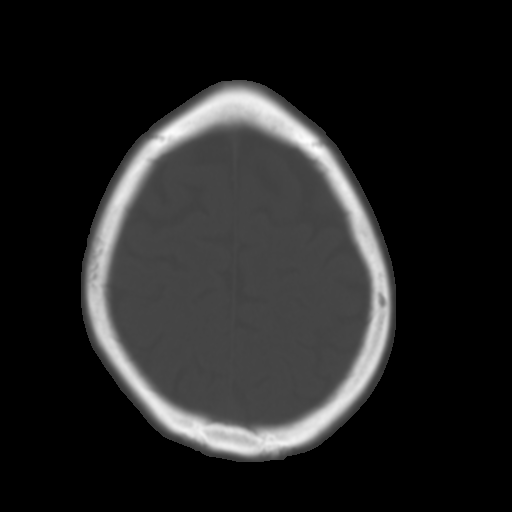
[im 25/31  brain]
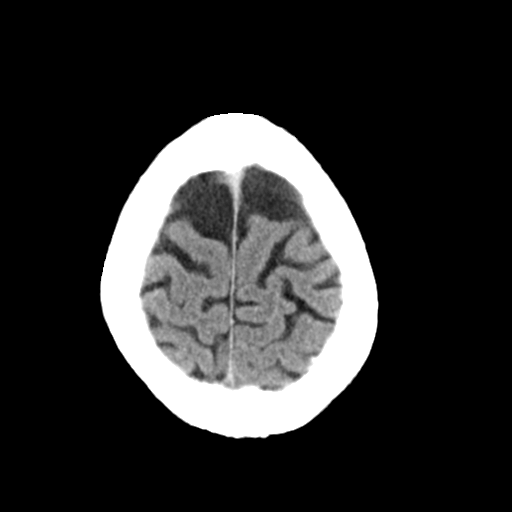
[im 27/31  brain]
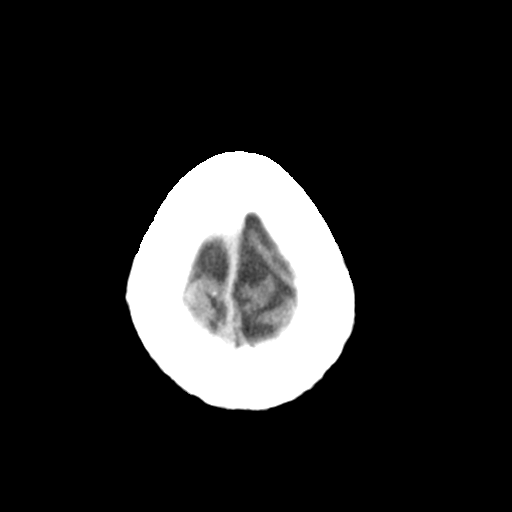
[im 29/31  brain]
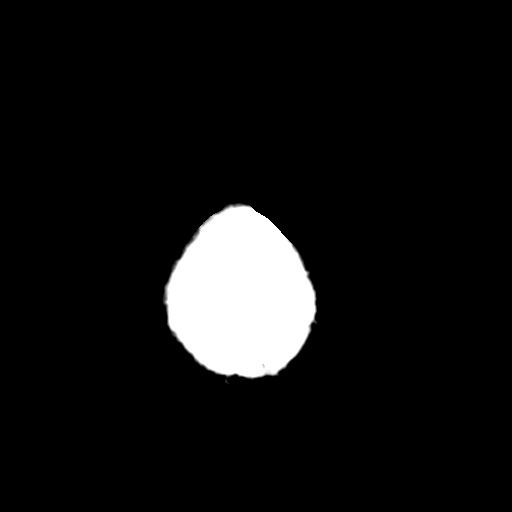

[16 of 30 positions shown; findings below may reference images not displayed]

FINDINGS: Brain: Normal ventricular morphology. No midline shift or mass
effect. Normal appearance of brain parenchyma. No intracranial
hemorrhage, mass lesion or evidence acute infarction. No extra-axial
fluid collections. Question empty sella.

Vascular: Unremarkable

Skull: Intact

Sinuses/Orbits: Clear

Other: N/A
IMPRESSION: No acute intracranial abnormalities.

Question empty sella.

## 2017-05-11 ENCOUNTER — Encounter: Payer: Self-pay | Admitting: Nurse Practitioner

## 2017-05-12 ENCOUNTER — Encounter: Payer: Self-pay | Admitting: Nurse Practitioner

## 2017-05-12 ENCOUNTER — Encounter (INDEPENDENT_AMBULATORY_CARE_PROVIDER_SITE_OTHER): Payer: Self-pay

## 2017-05-12 ENCOUNTER — Ambulatory Visit (INDEPENDENT_AMBULATORY_CARE_PROVIDER_SITE_OTHER): Payer: BLUE CROSS/BLUE SHIELD | Admitting: Nurse Practitioner

## 2017-05-12 VITALS — BP 101/77 | HR 74 | Ht 66.0 in | Wt 249.6 lb

## 2017-05-12 DIAGNOSIS — G4733 Obstructive sleep apnea (adult) (pediatric): Secondary | ICD-10-CM

## 2017-05-12 NOTE — Patient Instructions (Signed)
Suboptimal with his CPAP compliance Pt has just received new mask to try(nasal prongs) Recheck compliance in 3 months

## 2017-05-12 NOTE — Progress Notes (Addendum)
[facility]  PATIENT: Todd [name]. DOB: Jun 21, 1966   REASON FOR VISIT: Obstructive sleep apnea for CPAP compliance HISTORY FROM: Patient    HISTORY OF PRESENT ILLNESS: [name] is a 51 year old right-handed gentleman with an underlying medical history of reflux disease, hyperlipidemia, gout, PVCs, dizziness, impaired fasting glucose, hypertension and obesity, as well as recent chest pain, who presents for follow-up consultation of his OSA after a long gap of nearly 2 years. The patient is unaccompanied today. I first met him on 05/27/2015 at the request of his cardiologist, at which time the patient reported a prior diagnosis of OSA. He reported difficulty with CPAP in the past and he stopped using CPAP several years prior. I invited him for sleep study. He had a baseline sleep study, followed by a CPAP titration study. I went over his test results with him in detail today. His baseline sleep study from 06/24/2015 showed a sleep efficiency of 44% only, sleep latency was prolonged at 40.5 minutes and wake after sleep onset was high at 3 hours. He had severe sleep fragmentation. He had absence of slow-wave sleep and absence of REM sleep. He had frequent PVCs and PACs on EKG. He had mild to moderate snoring. Total AHI was highly elevated at 83.3 per hour, average oxygen saturation was 92%, nadir was 82% during non-REM sleep. He had no significant PLMS. Based on his medical history and his test results I invited him for a full night CPAP titration study. He had this on 07/17/2015. Sleep efficiency was 61.1%, sleep latency was 72 minutes, wake after sleep onset was 82 minutes. He had a normal percentage of slow-wave sleep and a mildly reduced percentage of REM sleep at 15.9% with a prolonged REM latency of 174 minutes. He had frequent PVCs and PACs on single-lead EKG. CPAP was titrated from 4 cm to 18 cm, AHI was reduced to 3.8 per hour at the final pressure. Supine REM  sleep was achieved. Average oxygen saturation was 94%, nadir was 78%. O2 nadir was 92% during supine REM sleep on the final pressure of CPAP. He was fitted with a full facemask. Based on his test results are prescribed CPAP therapy for home use. He did not return for follow-up since then.  Today, 3/14/2018SA (all dictated new, as well as above notes, some dictation done in note pad or Word, outside of chart, may appear as copied): I reviewed his CPAP compliance data from 01/09/2017 through 02/07/2017 which is a total of 30 days, during which time he used his CPAP only 21 days with percent used days greater than 4 hours at 20% only, indicating poor compliance with an average usage of 3 hours and 15 minutes only, residual AHI 3.1 per hour, leak acceptable with the 95th percentile at 7.9 L/m on a pressure of 15 cm with EPR of 3. He reports that he has still trouble adjusting to CPAP. The pressure currently does not bother him, he had a total for talking with a nasal pillows, especially after his surgery and uses a nasal mask now. His sleep is restless, he has a tendency to miss with the mask take it off in the middle of the night, not realizing it. When he is awakened realizes that he pulled off his mask or if he goes to the bathroom and comes back he tries to put the mask back on. He has skipped several nights in the past 30 and in the past 90 days, he uses the machine  about 70% of the time but most nights averaging less than 4 hours. He does report feeling tired during the day, he works 3 12-hour shifts on weekend nights and during the work week he is able to take care of his daughter who is 5. His wife works during the week, day shift. He had in the interim nasal surgery under Dr. [name]. He is status post septoplasty and turbinate reduction on 09/14/2016.  He is had a cold off and on, overall nasal congestion and nasal breathing are much improved after nasal surgery. He tries to use the reference that he was  given by the ENT surgeon.  UPDATE 06/14/2018CM Mr. Vickki Muff, 51 year old male returns for follow-up CPAP compliance. Usage days 25/30 or 83%. Greater than 4 hours 57% for 17 days less than 4 hours a day for 27%. Average usage 4 hours 41mn. 15 cm of pressure EPR level AHI 3.5 He continues to work 3 12 hour shifts on the weekend nights. He has just obtained a new nasal prongs this week from his supplier. ESS 3 FSS 19 He returns for reevaluation  REVIEW OF SYSTEMS: Full 14 system review of systems performed and notable only for those listed, all others are neg:  Constitutional: neg  Cardiovascular: neg Ear/Nose/Throat: neg  Skin: neg Eyes: neg Respiratory: neg Gastroitestinal: neg  Hematology/Lymphatic: neg  Endocrine: neg Musculoskeletal:neg Allergy/Immunology: neg Neurological: neg Psychiatric: neg Sleep : Obstructive sleep apnea with CPAP   ALLERGIES: Allergies  Allergen Reactions  . Metoprolol Other (See Comments)    Lethargy, didn't feel like doing anything (tolerates low dose carvedilol)  . Valsartan Other (See Comments)    Increased blood pressure  . Azithromycin Itching and Other (See Comments)    Tingling in fingers  . Entresto [Sacubitril-Valsartan] Rash and Other (See Comments)    Tingling in fingers/ redness & sensitivity at tips of fingers    HOME MEDICATIONS: Outpatient Medications Prior to Visit  Medication Sig Dispense Refill  . acetaminophen (TYLENOL) 500 MG tablet Take 1,000 mg by mouth daily as needed for headache.    .Marland Kitchenatorvastatin (LIPITOR) 20 MG tablet Take 1 tablet (20 mg total) by mouth daily at 6 PM. 30 tablet 2  . carvedilol (COREG) 25 MG tablet Take 25 mg by mouth 2 (two) times daily with a meal.    . Cholecalciferol (VITAMIN D) 2000 units tablet Take 2,000 Units by mouth daily.    .Marland Kitchendofetilide (TIKOSYN) 500 MCG capsule Take 1 capsule (500 mcg total) by mouth every 12 (twelve) hours. 14 capsule 0  . eplerenone (INSPRA) 25 MG tablet Take 25 mg by mouth  daily.    .Marland Kitchenolmesartan (BENICAR) 40 MG tablet Take 40 mg by mouth at bedtime.    .Marland Kitchenomeprazole (PRILOSEC) 20 MG capsule Take 20 mg by mouth daily.    . rivaroxaban (XARELTO) 20 MG TABS tablet Take 20 mg by mouth daily.     No facility-administered medications prior to visit.     PAST MEDICAL HISTORY: Past Medical History:  Diagnosis Date  . Atypical chest pain   . CHF (congestive heart failure) ([facility]   . Dizziness   . GERD (gastroesophageal reflux disease)   . Gout   . HLD (hyperlipidemia)   . Hypertension   . IFG (impaired fasting glucose)   . Obesity   . OSA on CPAP   . PVC (premature ventricular contraction)   . Shortness of breath dyspnea     PAST SURGICAL HISTORY: Past Surgical History:  Procedure  Laterality Date  . CARDIAC CATHETERIZATION N/A 07/01/2015   Procedure: Left Heart Cath;  Surgeon: Adrian Prows, MD;  Location: [place] [facility];  Service: Cardiovascular;  Laterality: N/A;  . CARPAL TUNNEL RELEASE    . ELECTROPHYSIOLOGIC STUDY N/A 02/05/2016   Procedure: Atrial Fibrillation Ablation;  Surgeon: Will Meredith Leeds, MD;  Location: [place] [facility];  Service: Cardiovascular;  Laterality: N/A;  . NASAL SEPTOPLASTY W/ TURBINOPLASTY Bilateral 09/14/2016   Procedure: NASAL SEPTOPLASTY WITH BILATERAL  TURBINATE REDUCTION;  Surgeon: Leta Baptist, MD;  Location: [place];  Service: ENT;  Laterality: Bilateral;    FAMILY HISTORY: Family History  Problem Relation Age of Onset  . Seizures Mother   . Deep vein thrombosis Mother   . COPD Mother   . Heart failure Mother   . Lung cancer Mother   . Prostate cancer Father   . Hypertension Father   . Diabetes Father   . Stroke Paternal Grandfather   . Heart attack Neg Hx     SOCIAL HISTORY: Social History   Social History  . Marital status: Married    Spouse name: Lauro Regulus  . Number of children: 1  . Years of education: HS   Occupational History  . [employer]    Social History Main Topics  . Smoking status:  Never Smoker  . Smokeless tobacco: Never Used  . Alcohol use 0.6 oz/week    1 Glasses of wine per week     Comment: Rare occasion  . Drug use: No  . Sexual activity: Yes   Other Topics Concern  . Not on file   Social History Narrative   Drinks 1 Mt Dew 2 days a week      PHYSICAL EXAM  Vitals:   05/12/17 0916  BP: 101/77  Pulse: 74  Weight: 249 lb 9.6 oz (113.2 kg)  Height: _0  (1.676 m)   Body mass index is 40.29 kg/m.  Generalized: Well developed, Obese male in no acute distress  Head: normocephalic and atraumatic,. Oropharynx benign  Neck: Supple, Cardiac: Regular rate rhythm, no murmur  Musculoskeletal: No deformity   Neurological examination   Mentation: Alert oriented to time, place, history taking. Attention span and concentration appropriate. Recent and remote memory intact.  Follows all commands speech and language fluent. ESS 3. FSS 19  Cranial nerve II-XII: Pupils were equal round reactive to light extraocular movements were full, visual field were full on confrontational test. Facial sensation and strength were normal. hearing was intact to finger rubbing bilaterally. Uvula tongue midline. head turning and shoulder shrug were normal and symmetric.Tongue protrusion into cheek strength was normal. Motor: normal bulk and tone, full strength in the BUE, BLE, fine finger movements normal, no pronator drift. No focal weakness Sensory: normal and symmetric to light touch,  Coordination: finger-nose-finger, heel-to-shin bilaterally, no dysmetria Reflexes: 1+ upper lower and symmetric plantar responses were flexor bilaterally. Gait and Station: Rising up from seated position without assistance, normal stance,  moderate stride, good arm swing, smooth turning, able to perform tiptoe, and heel walking without difficulty. Tandem gait is steady  DIAGNOSTIC DATA (LABS, IMAGING, TESTING) - I reviewed patient records, labs, notes, testing and imaging myself where  available.  Lab Results  Component Value Date   WBC 5.8 01/27/2016   HGB 13.8 01/27/2016   HCT 41.7 01/27/2016   MCV 79.0 01/27/2016   PLT 182 01/27/2016      Component Value Date/Time   NA 140 01/27/2016 0840   K  4.2 01/27/2016 0840   CL 104 01/27/2016 0840   CO2 28 01/27/2016 0840   GLUCOSE 91 01/27/2016 0840   BUN 13 01/27/2016 0840   CREATININE 0.93 01/27/2016 0840   CALCIUM 8.8 01/27/2016 0840   PROT 6.6 12/07/2015 1330   ALBUMIN 3.7 12/07/2015 1330   AST 19 12/07/2015 1330   ALT 28 12/07/2015 1330   ALKPHOS 47 12/07/2015 1330   BILITOT 0.8 12/07/2015 1330   GFRNONAA >60 12/10/2015 0234   GFRAA >60 12/10/2015 0234   Lab Results  Component Value Date   CHOL 166 12/08/2015   HDL 37 (L) 12/08/2015   LDLCALC 111 (H) 12/08/2015   TRIG 89 12/08/2015   CHOLHDL 4.5 12/08/2015    ASSESSMENT AND PLAN  51 y.o. year old male  has a past medical history of hyperlipidemia, gout, obesity, prediabetes, hypertension and nasal congestion, status post recent nasal turbinate reduction and septoplasty in October 2017, who presents for follow-up consultation of his severe obstructive sleep apnea . 30 day compliance review which is suboptimal. When he uses his CPAP for sleep apnea is under good control.  Suboptimal with his CPAP compliance Pt has just received new mask to try(nasal prongs) Recheck compliance in 3 months I explained the importance of being compliant with his treatment not only for insurance purposes but to improve his symptoms as well and for overall health benefit Portions of the above documentation or copy from previous visit for review purposes Dennie Bible, [facility], [facility], [facility] [address] [place], [place] [address] [phone]  I reviewed the above note and documentation by the Nurse Practitioner and agree with the history, physical exam, assessment and plan as outlined above. I was immediately available for  face-to-face consultation. Star Age, MD, PhD [facility])

## 2017-08-15 NOTE — Progress Notes (Addendum)
[facility]  PATIENT: Todd [name]. DOB: Jun 21, 1966   REASON FOR VISIT: Obstructive sleep apnea for CPAP compliance HISTORY FROM: Patient    HISTORY OF PRESENT ILLNESS: Todd [name] is a 51 year old right-handed gentleman with an underlying medical history of reflux disease, hyperlipidemia, gout, PVCs, dizziness, impaired fasting glucose, hypertension and obesity, as well as recent chest pain, who presents for follow-up consultation of his OSA after a long gap of nearly 2 years. The patient is unaccompanied today. I first met him on 05/27/2015 at the request of his cardiologist, at which time the patient reported a prior diagnosis of OSA. He reported difficulty with CPAP in the past and he stopped using CPAP several years prior. I invited him for sleep study. He had a baseline sleep study, followed by a CPAP titration study. I went over his test results with him in detail today. His baseline sleep study from 06/24/2015 showed a sleep efficiency of 44% only, sleep latency was prolonged at 40.5 minutes and wake after sleep onset was high at 3 hours. He had severe sleep fragmentation. He had absence of slow-wave sleep and absence of REM sleep. He had frequent PVCs and PACs on EKG. He had mild to moderate snoring. Total AHI was highly elevated at 83.3 per hour, average oxygen saturation was 92%, nadir was 82% during non-REM sleep. He had no significant PLMS. Based on his medical history and his test results I invited him for a full night CPAP titration study. He had this on 07/17/2015. Sleep efficiency was 61.1%, sleep latency was 72 minutes, wake after sleep onset was 82 minutes. He had a normal percentage of slow-wave sleep and a mildly reduced percentage of REM sleep at 15.9% with a prolonged REM latency of 174 minutes. He had frequent PVCs and PACs on single-lead EKG. CPAP was titrated from 4 cm to 18 cm, AHI was reduced to 3.8 per hour at the final pressure. Supine REM  sleep was achieved. Average oxygen saturation was 94%, nadir was 78%. O2 nadir was 92% during supine REM sleep on the final pressure of CPAP. He was fitted with a full facemask. Based on his test results are prescribed CPAP therapy for home use. He did not return for follow-up since then.  Today, 3/14/2018SA (all dictated new, as well as above notes, some dictation done in note pad or Word, outside of chart, may appear as copied): I reviewed his CPAP compliance data from 01/09/2017 through 02/07/2017 which is a total of 30 days, during which time he used his CPAP only 21 days with percent used days greater than 4 hours at 20% only, indicating poor compliance with an average usage of 3 hours and 15 minutes only, residual AHI 3.1 per hour, leak acceptable with the 95th percentile at 7.9 L/m on a pressure of 15 cm with EPR of 3. He reports that he has still trouble adjusting to CPAP. The pressure currently does not bother him, he had a total for talking with a nasal pillows, especially after his surgery and uses a nasal mask now. His sleep is restless, he has a tendency to miss with the mask take it off in the middle of the night, not realizing it. When he is awakened realizes that he pulled off his mask or if he goes to the bathroom and comes back he tries to put the mask back on. He has skipped several nights in the past 30 and in the past 90 days, he uses the machine  about 70% of the time but most nights averaging less than 4 hours. He does report feeling tired during the day, he works 3 12-hour shifts on weekend nights and during the work week he is able to take care of his daughter who is 5. His wife works during the week, day shift. He had in the interim nasal surgery under Dr. [name]. He is status post septoplasty and turbinate reduction on 09/14/2016.  He is had a cold off and on, overall nasal congestion and nasal breathing are much improved after nasal surgery. He tries to use the reference that he was  given by the ENT surgeon.  UPDATE 06/14/2018CM Todd [name], 51 year old [name] for follow-up CPAP compliance. Usage days 25/30 or 83%. Greater than 4 hours 57% for 17 days less than 4 hours a day for 27%. Average usage 4 hours 34mn. 15 cm of pressure EPR level AHI 3.5 He continues to work 3 12 hour shifts on the weekend nights. He has just obtained a new nasal prongs this week from his supplier. ESS 3 FSS 19 He returns for reevaluation UPDATE   9/18/218CM  Todd [name]  for followup CPAP compliance.  Compliance data  Dated 8/1 /2018- 08/302018 shows usage greater than 4 hours for 22 days or 73%.average usage 5 hours and 9 minutes. Set pressure 15 cm. AHI 3.3. He continues to work night shift Saturday through Monday. ESS score 5. He denies any daytime drowsiness.  He returns for reevauation  REVIEW OF SYSTEMS: Full 14 system review of systems performed and notable only for those listed, all others are neg:  Constitutional: neg  Cardiovascular: neg Ear/Nose/Throat: neg  Skin: neg Eyes: neg Respiratory: neg Gastroitestinal: neg  Hematology/Lymphatic: neg  Endocrine: neg Musculoskeletal:neg Allergy/Immunology: neg Neurological: neg Psychiatric: neg Sleep : Obstructive sleep apnea with CPAP   ALLERGIES: Allergies  Allergen Reactions  . Metoprolol Other (See Comments)    Lethargy, didn't feel like doing anything (tolerates low dose carvedilol)  . Valsartan Other (See Comments)    Increased blood pressure  . Azithromycin Itching and Other (See Comments)    Tingling in fingers  . Entresto [Sacubitril-Valsartan] Rash and Other (See Comments)    Tingling in fingers/ redness & sensitivity at tips of fingers    HOME MEDICATIONS: Outpatient Medications Prior to Visit  Medication Sig Dispense Refill  . acetaminophen (TYLENOL) 500 MG tablet Take 1,000 mg by mouth daily as needed for headache.    . carvedilol (COREG) 25 MG tablet Take 25 mg by mouth 2 (two) times  daily with a meal.    . Cholecalciferol (VITAMIN D) 2000 units tablet Take 2,000 Units by mouth daily.    .Marland Kitchendofetilide (TIKOSYN) 500 MCG capsule Take 1 capsule (500 mcg total) by mouth every 12 (twelve) hours. 14 capsule 0  . eplerenone (INSPRA) 25 MG tablet Take 25 mg by mouth daily.    .Marland Kitchenolmesartan (BENICAR) 40 MG tablet Take 40 mg by mouth at bedtime.    .Marland Kitchenomeprazole (PRILOSEC) 20 MG capsule Take 20 mg by mouth daily.    . rivaroxaban (XARELTO) 20 MG TABS tablet Take 20 mg by mouth daily.    .Marland Kitchenatorvastatin (LIPITOR) 20 MG tablet Take 1 tablet (20 mg total) by mouth daily at 6 PM. 30 tablet 2   No facility-administered medications prior to visit.     PAST MEDICAL HISTORY: Past Medical History:  Diagnosis Date  . Atypical chest pain   . CHF (congestive heart failure) (  HCC)   . Dizziness   . GERD (gastroesophageal reflux disease)   . Gout   . HLD (hyperlipidemia)   . Hypertension   . IFG (impaired fasting glucose)   . Obesity   . OSA on CPAP   . PVC (premature ventricular contraction)   . Shortness of breath dyspnea     PAST SURGICAL HISTORY: Past Surgical History:  Procedure Laterality Date  . CARDIAC CATHETERIZATION N/A 07/01/2015   Procedure: Left Heart Cath;  Surgeon: Yates Decamp, MD;  [facility];  Service: Cardiovascular;  Laterality: N/A;  . CARPAL TUNNEL RELEASE    . ELECTROPHYSIOLOGIC STUDY N/A 02/05/2016   Procedure: Atrial Fibrillation Ablation;  Surgeon: Will [name], MD;  Location: [facility] INVASIVE CV LAB;  Service: Cardiovascular;  Laterality: N/A;  . NASAL SEPTOPLASTY W/ TURBINOPLASTY Bilateral 09/14/2016   Procedure: NASAL SEPTOPLASTY WITH BILATERAL  TURBINATE REDUCTION;  Surgeon: Newman Pies, MD;  Location: [facility];  Service: ENT;  Laterality: Bilateral;    FAMILY HISTORY: Family History  Problem Relation Age of Onset  . Seizures Mother   . Deep vein thrombosis Mother   . COPD Mother   . Heart failure Mother   . Lung cancer  Mother   . Prostate cancer Father   . Hypertension Father   . Diabetes Father   . Stroke Paternal Grandfather   . Heart attack Neg Hx     SOCIAL HISTORY: Social History   Social History  . Marital status: Married    Spouse name: Dorisann Frames  . Number of children: 1  . Years of education: HS   Occupational History  . [employer]    Social History Main Topics  . Smoking status: Never Smoker  . Smokeless tobacco: Never Used  . Alcohol use 0.6 oz/week    1 Glasses of wine per week     Comment: Rare occasion  . Drug use: No  . Sexual activity: Yes   Other Topics Concern  . Not on file   Social History Narrative   Drinks 1 Mt Dew 2 days a week      PHYSICAL EXAM  Vitals:   08/16/17 0858  BP: 122/81  Pulse: 60  Weight: 251 lb 9.6 oz (114.1 kg)   Body mass index is 40.61 kg/m.  Generalized: Well developed, Obese [name] in no acute distress  Head: normocephalic and atraumatic,. Oropharynx benign  Neck: Supple, Cardiac: Regular rate rhythm, no murmur  Musculoskeletal: No deformity   Neurological examination   Mentation: Alert oriented to time, place, history taking. Attention span and concentration appropriate. Recent and remote memory intact.  Follows all commands speech and language fluent. ESS 5  Cranial nerve II-XII: Pupils were equal round reactive to light extraocular movements were full, visual field were full on confrontational test. Facial sensation and strength were normal. hearing was intact to finger rubbing bilaterally. Uvula tongue midline. head turning and shoulder shrug were normal and symmetric.Tongue protrusion into cheek strength was normal. Motor: normal bulk and tone, full strength in the BUE, BLE, fine finger movements normal, no pronator drift. No focal weakness Sensory: normal and symmetric to light touch,  Coordination: finger-nose-finger, heel-to-shin bilaterally, no dysmetria Reflexes: 1+ upper lower and symmetric plantar responses were flexor  bilaterally. Gait and Station: Rising up from seated position without assistance, normal stance,  moderate stride, good arm swing, smooth turning, able to perform tiptoe, and heel walking without difficulty. Tandem gait is steady  DIAGNOSTIC DATA (LABS, IMAGING, TESTING) - I  reviewed patient records, labs, notes, testing and imaging myself where available.  Lab Results  Component Value Date   WBC 5.8 01/27/2016   HGB 13.8 01/27/2016   HCT 41.7 01/27/2016   MCV 79.0 01/27/2016   PLT 182 01/27/2016      Component Value Date/Time   NA 140 01/27/2016 0840   K 4.2 01/27/2016 0840   CL 104 01/27/2016 0840   CO2 28 01/27/2016 0840   GLUCOSE 91 01/27/2016 0840   BUN 13 01/27/2016 0840   CREATININE 0.93 01/27/2016 0840   CALCIUM 8.8 01/27/2016 0840   PROT 6.6 12/07/2015 1330   ALBUMIN 3.7 12/07/2015 1330   AST 19 12/07/2015 1330   ALT 28 12/07/2015 1330   ALKPHOS 47 12/07/2015 1330   BILITOT 0.8 12/07/2015 1330   GFRNONAA >60 12/10/2015 0234   GFRAA >60 12/10/2015 0234   Lab Results  Component Value Date   CHOL 166 12/08/2015   HDL 37 (L) 12/08/2015   LDLCALC 111 (H) 12/08/2015   TRIG 89 12/08/2015   CHOLHDL 4.5 12/08/2015    ASSESSMENT AND PLAN  51 y.o. year old [name]  has a past medical history of hyperlipidemia, gout, obesity, prediabetes, hypertension and nasal congestion, status post recent nasal turbinate reduction and septoplasty in October 2017, who presents for follow-up consultation of his severe obstructive sleep apnea . 30 day compliance review which is >73%Compliance data  Dated 8/1 /2018- 08/302018 shows usage greater than 4 hours for 22 days or 73%.average usage 5 hours and 9 minutes. Set pressure 15 cm. AHI 3.3.   PLAN: Pt has just received new mask stopped nasal prongs Recheck compliance in 6 months  continue same settings I explained the importance of being compliant with his treatment not only for insurance purposes but to improve his symptoms as well and  for overall health benefit Portions of the above documentation or copy from previous visit for review purposes Dennie Bible, [facility], [facility], [facility] [address] [place], [place] [address] [phone]  I reviewed the above note and documentation by the Nurse Practitioner and agree with the history, physical exam, assessment and plan as outlined above. I was immediately available for face-to-face consultation. Star Age, MD, PhD [facility])

## 2017-08-16 ENCOUNTER — Ambulatory Visit (INDEPENDENT_AMBULATORY_CARE_PROVIDER_SITE_OTHER): Payer: BLUE CROSS/BLUE SHIELD | Admitting: Nurse Practitioner

## 2017-08-16 ENCOUNTER — Encounter: Payer: Self-pay | Admitting: Nurse Practitioner

## 2017-08-16 VITALS — BP 122/81 | HR 60 | Wt 251.6 lb

## 2017-08-16 DIAGNOSIS — G4733 Obstructive sleep apnea (adult) (pediatric): Secondary | ICD-10-CM | POA: Diagnosis not present

## 2017-08-16 NOTE — Patient Instructions (Signed)
Ess score 5  Continue same settings  Follow up in 6 months

## 2017-09-20 NOTE — Progress Notes (Signed)
Electrophysiology Office Note   Date:  09/21/2017   ID:  Todd [name]., DOB 02-24-1966, MRN [id number]  PCP:  Martha Clan, MD  Cardiologist:  [name] Primary Electrophysiologist:  Belenda Alviar Todd Loa, MD    Chief Complaint  Patient presents with  . Follow-up    PAF     History of Present Illness: Todd [name]. is a 51 y.o. male who presents today for electrophysiology evaluation.   He has a history of systolic HF with an EF of 30%. He had a cath which showed no CAD.Todd [name] He also has OSA and is on CPAP, HTN, HLD, morbid obesity, as well as occasional PVCs. He presents today for follow-up of his atrial fibrillation. He had ablation on 02/05/16.  He has been feeling well since last being seen. He is able to do most of his daily activities without issue. He does have a few episodes of palpitations that last up to 15 seconds, but none that are prolonged.  Today, denies symptoms of chest pain, shortness of breath, orthopnea, PND, lower extremity edema, claudication, dizziness, presyncope, syncope, bleeding, or neurologic sequela. The patient is tolerating medications without difficulties.     Past Medical History:  Diagnosis Date  . Atypical chest pain   . CHF (congestive heart failure) (HCC)   . Dizziness   . GERD (gastroesophageal reflux disease)   . Gout   . HLD (hyperlipidemia)   . Hypertension   . IFG (impaired fasting glucose)   . Obesity   . OSA on CPAP   . PVC (premature ventricular contraction)   . Shortness of breath dyspnea    Past Surgical History:  Procedure Laterality Date  . CARDIAC CATHETERIZATION N/A 07/01/2015   Procedure: Left Heart Cath;  Surgeon: Yates Decamp, MD;  Location: [facility] INVASIVE CV LAB;  Service: Cardiovascular;  Laterality: N/A;  . CARPAL TUNNEL RELEASE    . ELECTROPHYSIOLOGIC STUDY N/A 02/05/2016   Procedure: Atrial Fibrillation Ablation;  Surgeon: Laraine Samet Todd Loa, MD;  Location: [facility] INVASIVE CV LAB;  Service: Cardiovascular;  Laterality:  N/A;  . NASAL SEPTOPLASTY W/ TURBINOPLASTY Bilateral 09/14/2016   Procedure: NASAL SEPTOPLASTY WITH BILATERAL  TURBINATE REDUCTION;  Surgeon: Newman Pies, MD;  Location: [facility];  Service: ENT;  Laterality: Bilateral;     Current Outpatient Prescriptions  Medication Sig Dispense Refill  . acetaminophen (TYLENOL) 500 MG tablet Take 1,000 mg by mouth daily as needed for headache.    Todd [name] atorvastatin (LIPITOR) 10 MG tablet 10 mg daily.    . carvedilol (COREG) 25 MG tablet Take 25 mg by mouth 2 (two) times daily with a meal.    . Cholecalciferol (VITAMIN D) 2000 units tablet Take 2,000 Units by mouth daily.    Todd [name] dofetilide (TIKOSYN) 500 MCG capsule Take 1 capsule (500 mcg total) by mouth every 12 (twelve) hours. 14 capsule 0  . eplerenone (INSPRA) 25 MG tablet Take 25 mg by mouth daily.    Todd [name] olmesartan (BENICAR) 40 MG tablet Take 40 mg by mouth at bedtime.    Todd [name] omeprazole (PRILOSEC) 20 MG capsule Take 20 mg by mouth daily.    . rivaroxaban (XARELTO) 20 MG TABS tablet Take 20 mg by mouth daily.     No current facility-administered medications for this visit.     Allergies:   Metoprolol; Valsartan; Azithromycin; and Entresto [sacubitril-valsartan]   Social History:  The patient  reports that he has never smoked. He has never used smokeless tobacco. He reports that  he drinks about 0.6 oz of alcohol per week . He reports that he does not use drugs.   Family History:  The patient's family history includes COPD in his mother; Deep vein thrombosis in his mother; Diabetes in his father; Heart failure in his mother; Hypertension in his father; Lung cancer in his mother; Prostate cancer in his father; Seizures in his mother; Stroke in his paternal grandfather.    ROS:  Please see the history of present illness.   Otherwise, review of systems is positive for appetite change, anxiety, dizziness.   All other systems are reviewed and negative.   PHYSICAL EXAM: VS:  BP 110/88   Pulse (!) 54    Ht 5\' 6"  (1.676 m)   Wt 249 lb 6.4 oz (113.1 kg)   SpO2 95%   BMI 40.25 kg/m  , BMI Body mass index is 40.25 kg/m. GEN: Well nourished, well developed, in no acute distress  HEENT: normal  Neck: no JVD, carotid bruits, or masses Cardiac: RRR; no murmurs, rubs, or gallops,no edema  Respiratory:  clear to auscultation bilaterally, normal work of breathing GI: soft, nontender, nondistended, + BS MS: no deformity or atrophy  Skin: warm and dry Neuro:  Strength and sensation are intact Psych: euthymic mood, full affect  EKG:  EKG is ordered today. Personal review of the ekg ordered shows sinus rhythm, rate 54   Recent Labs: No results found for requested labs within last 8760 hours.    Lipid Panel     Component Value Date/Time   CHOL 166 12/08/2015 0352   TRIG 89 12/08/2015 0352   HDL 37 (L) 12/08/2015 0352   CHOLHDL 4.5 12/08/2015 0352   VLDL 18 12/08/2015 0352   LDLCALC 111 (H) 12/08/2015 0352     Wt Readings from Last 3 Encounters:  09/21/17 249 lb 6.4 oz (113.1 kg)  08/16/17 251 lb 9.6 oz (114.1 kg)  05/12/17 249 lb 9.6 oz (113.2 kg)      Other studies Reviewed: Additional studies/ records that were reviewed today include: TTE 12/08/15  Review of the above records today demonstrates:  - Left ventricle: The cavity size was mildly dilated. There was mild concentric hypertrophy. Systolic function was severely reduced. The estimated ejection fraction was in the range of 20% to 25%. Diffuse hypokinesis. Features are consistent with a pseudonormal left ventricular filling pattern, with concomitant abnormal relaxation and increased filling pressure (grade 2 diastolic dysfunction). Doppler parameters are consistent with both elevated ventricular end-diastolic filling pressure and elevated left atrial filling pressure. - Mitral valve: There was mild to moderate regurgitation.  TTE 05/26/16 -Left ventricle cavity is moderately dilated at 6.5 cm. Moderate  concentric LVH. Mild diffuse hypokinesis. Normal diastolic filling pattern. Ejection fraction 43% -Left atrial cavity mildly dilated -Grade 1 mitral regurgitation  ASSESSMENT AND PLAN:  1.  Persistent atrial fibrillation: Currently on Xarelto. Atrial fibrillation ablation 02/05/16. Feeling much improved since ablation with improved energy. No changes at this time.  This patients CHA2DS2-VASc Score and unadjusted Ischemic Stroke Rate (% per year) is equal to 2.2 % stroke rate/year from a score of 2  Above score calculated as 1 point each if present [CHF, HTN, DM, Vascular=MI/PAD/Aortic Plaque, Age if 65-74, or Male] Above score calculated as 2 points each if present [Age > 75, or Stroke/TIA/TE]  2. Hypertension: Currently well controlled. No changes.  3. Chronic congestive heart failure: Ejection fraction improved since ablation. Well compensated today.  4. Obstructive sleep apnea: Compliant with CPAP.  Current medicines  are reviewed at length with the patient today.   The patient does not have concerns regarding his medicines.  The following changes were made today:  none  Labs/ tests ordered today include:  Orders Placed This Encounter  Procedures  . EKG 12-Lead     Disposition:   FU with Teshawn Moan 6 months  Signed, Genny Caulder Todd LoaMartin Merritt Kibby, MD  09/21/2017 11:22 AM     [facility] [address] [place] [address] [phone] (office) [phone] (fax)

## 2017-09-21 ENCOUNTER — Ambulatory Visit (INDEPENDENT_AMBULATORY_CARE_PROVIDER_SITE_OTHER): Payer: BLUE CROSS/BLUE SHIELD | Admitting: Cardiology

## 2017-09-21 ENCOUNTER — Encounter: Payer: Self-pay | Admitting: Cardiology

## 2017-09-21 VITALS — BP 110/88 | HR 54 | Ht 66.0 in | Wt 249.4 lb

## 2017-09-21 DIAGNOSIS — I428 Other cardiomyopathies: Secondary | ICD-10-CM

## 2017-09-21 DIAGNOSIS — I10 Essential (primary) hypertension: Secondary | ICD-10-CM | POA: Diagnosis not present

## 2017-09-21 DIAGNOSIS — G4733 Obstructive sleep apnea (adult) (pediatric): Secondary | ICD-10-CM

## 2017-09-21 DIAGNOSIS — I480 Paroxysmal atrial fibrillation: Secondary | ICD-10-CM | POA: Diagnosis not present

## 2017-09-21 NOTE — Patient Instructions (Signed)
Medication Instructions:    Your physician recommends that you continue on your current medications as directed. Please refer to the Current Medication list given to you today.  Labwork:  None ordered  Testing/Procedures:  None ordered  Follow-Up:  Your physician wants you to follow-up in: 6 months with Dr. Camnitz.  You will receive a reminder letter in the mail two months in advance. If you don't receive a letter, please call our office to schedule the follow-up appointment.  - If you need a refill on your cardiac medications before your next appointment, please call your pharmacy.    Thank you for choosing [facility]!!   Braxston Quinter, RN [phone]         

## 2017-12-01 DIAGNOSIS — G4733 Obstructive sleep apnea (adult) (pediatric): Secondary | ICD-10-CM | POA: Diagnosis not present

## 2017-12-09 DIAGNOSIS — I10 Essential (primary) hypertension: Secondary | ICD-10-CM | POA: Diagnosis not present

## 2017-12-09 DIAGNOSIS — I480 Paroxysmal atrial fibrillation: Secondary | ICD-10-CM | POA: Diagnosis not present

## 2017-12-09 DIAGNOSIS — I5032 Chronic diastolic (congestive) heart failure: Secondary | ICD-10-CM | POA: Diagnosis not present

## 2017-12-09 DIAGNOSIS — I428 Other cardiomyopathies: Secondary | ICD-10-CM | POA: Diagnosis not present

## 2017-12-27 DIAGNOSIS — G4733 Obstructive sleep apnea (adult) (pediatric): Secondary | ICD-10-CM | POA: Diagnosis not present

## 2018-01-02 DIAGNOSIS — G4733 Obstructive sleep apnea (adult) (pediatric): Secondary | ICD-10-CM | POA: Diagnosis not present

## 2018-01-04 DIAGNOSIS — G4733 Obstructive sleep apnea (adult) (pediatric): Secondary | ICD-10-CM | POA: Diagnosis not present

## 2018-01-25 DIAGNOSIS — N448 Other noninflammatory disorders of the testis: Secondary | ICD-10-CM | POA: Diagnosis not present

## 2018-02-13 NOTE — Progress Notes (Signed)
[facility]  PATIENT: Todd [name]. DOB: Jun 21, 1966   REASON FOR VISIT: Obstructive sleep apnea for CPAP compliance HISTORY FROM: Patient    HISTORY OF PRESENT ILLNESS: [name] is a 52 year old right-handed gentleman with an underlying medical history of reflux disease, hyperlipidemia, gout, PVCs, dizziness, impaired fasting glucose, hypertension and obesity, as well as recent chest pain, who presents for follow-up consultation of his OSA after a long gap of nearly 2 years. The patient is unaccompanied today. I first met him on 05/27/2015 at the request of his cardiologist, at which time the patient reported a prior diagnosis of OSA. He reported difficulty with CPAP in the past and he stopped using CPAP several years prior. I invited him for sleep study. He had a baseline sleep study, followed by a CPAP titration study. I went over his test results with him in detail today. His baseline sleep study from 06/24/2015 showed a sleep efficiency of 44% only, sleep latency was prolonged at 40.5 minutes and wake after sleep onset was high at 3 hours. He had severe sleep fragmentation. He had absence of slow-wave sleep and absence of REM sleep. He had frequent PVCs and PACs on EKG. He had mild to moderate snoring. Total AHI was highly elevated at 83.3 per hour, average oxygen saturation was 92%, nadir was 82% during non-REM sleep. He had no significant PLMS. Based on his medical history and his test results I invited him for a full night CPAP titration study. He had this on 07/17/2015. Sleep efficiency was 61.1%, sleep latency was 72 minutes, wake after sleep onset was 82 minutes. He had a normal percentage of slow-wave sleep and a mildly reduced percentage of REM sleep at 15.9% with a prolonged REM latency of 174 minutes. He had frequent PVCs and PACs on single-lead EKG. CPAP was titrated from 4 cm to 18 cm, AHI was reduced to 3.8 per hour at the final pressure. Supine REM  sleep was achieved. Average oxygen saturation was 94%, nadir was 78%. O2 nadir was 92% during supine REM sleep on the final pressure of CPAP. He was fitted with a full facemask. Based on his test results are prescribed CPAP therapy for home use. He did not return for follow-up since then.  Today, 3/14/2018SA (all dictated new, as well as above notes, some dictation done in note pad or Word, outside of chart, may appear as copied): I reviewed his CPAP compliance data from 01/09/2017 through 02/07/2017 which is a total of 30 days, during which time he used his CPAP only 21 days with percent used days greater than 4 hours at 20% only, indicating poor compliance with an average usage of 3 hours and 15 minutes only, residual AHI 3.1 per hour, leak acceptable with the 95th percentile at 7.9 L/m on a pressure of 15 cm with EPR of 3. He reports that he has still trouble adjusting to CPAP. The pressure currently does not bother him, he had a total for talking with a nasal pillows, especially after his surgery and uses a nasal mask now. His sleep is restless, he has a tendency to miss with the mask take it off in the middle of the night, not realizing it. When he is awakened realizes that he pulled off his mask or if he goes to the bathroom and comes back he tries to put the mask back on. He has skipped several nights in the past 30 and in the past 90 days, he uses the machine  about 70% of the time but most nights averaging less than 4 hours. He does report feeling tired during the day, he works 3 12-hour shifts on weekend nights and during the work week he is able to take care of his daughter who is 5. His wife works during the week, day shift. He had in the interim nasal surgery under Dr. [name]. He is status post septoplasty and turbinate reduction on 09/14/2016.  He is had a cold off and on, overall nasal congestion and nasal breathing are much improved after nasal surgery. He tries to use the reference that he was  given by the ENT surgeon.  UPDATE 06/14/2018CM Mr. Vickki Muff, 52 year old male returns for follow-up CPAP compliance. Usage days 25/30 or 83%. Greater than 4 hours 57% for 17 days less than 4 hours a day for 27%. Average usage 4 hours 66mn. 15 cm of pressure EPR level AHI 3.5 He continues to work 3 12 hour shifts on the weekend nights. He has just obtained a new nasal prongs this week from his supplier. ESS 3 FSS 19 He returns for reevaluation UPDATE   9/18/218CM  Mr. FDirr89year old male returns  for followup CPAP compliance.  Compliance data  Dated 8/1 /2018- 08/302018 shows usage greater than 4 hours for 22 days or 73%.average usage 5 hours and 9 minutes. Set pressure 15 cm. AHI 3.3. He continues to work night shift Saturday through Monday. ESS score 5. He denies any daytime drowsiness.  He returns for reevauation UPDATE 3/20/2019CM Mr. FGreenhaw 52year old male returns for follow-up with history of obstructive sleep apnea for CPAP compliance.  He has not been having issues with his machine he did have a cold and did not use the machine for a couple of days in the last month.  Compliance data dated 01/16/2018-02/14/2018 shows compliance greater than 4 hours at 93%.  Average usage 5 hours 59 minutes.  Set pressure 15 cm EPR level 1 AHI 1.7 leak 95th percentile 7.6.  ESS 4.  He returns for reevaluation REVIEW OF SYSTEMS: Full 14 system review of systems performed and notable only for those listed, all others are neg:  Constitutional: neg  Cardiovascular: neg Ear/Nose/Throat: neg  Skin: neg Eyes: neg Respiratory: neg Gastroitestinal: neg  Hematology/Lymphatic: neg  Endocrine: neg Musculoskeletal:neg Allergy/Immunology: neg Neurological: neg Psychiatric: neg Sleep : Obstructive sleep apnea with CPAP   ALLERGIES: Allergies  Allergen Reactions  . Metoprolol Other (See Comments)    Lethargy, didn't feel like doing anything (tolerates low dose carvedilol)  . Valsartan Other (See Comments)     Increased blood pressure  . Azithromycin Itching and Other (See Comments)    Tingling in fingers  . Entresto [Sacubitril-Valsartan] Rash and Other (See Comments)    Tingling in fingers/ redness & sensitivity at tips of fingers    HOME MEDICATIONS: Outpatient Medications Prior to Visit  Medication Sig Dispense Refill  . acetaminophen (TYLENOL) 500 MG tablet Take 1,000 mg by mouth daily as needed for headache.    .Marland Kitchenatorvastatin (LIPITOR) 10 MG tablet 10 mg daily.    . carvedilol (COREG) 25 MG tablet Take 25 mg by mouth 2 (two) times daily with a meal.    . Cholecalciferol (VITAMIN D) 2000 units tablet Take 2,000 Units by mouth daily.    .Marland Kitchendofetilide (TIKOSYN) 500 MCG capsule Take 1 capsule (500 mcg total) by mouth every 12 (twelve) hours. 14 capsule 0  . eplerenone (INSPRA) 25 MG tablet Take 25 mg by mouth daily.    .[name]  olmesartan (BENICAR) 40 MG tablet Take 40 mg by mouth at bedtime.    [name] omeprazole (PRILOSEC) 20 MG capsule Take 20 mg by mouth daily.    . rivaroxaban (XARELTO) 20 MG TABS tablet Take 20 mg by mouth daily.     No facility-administered medications prior to visit.     PAST MEDICAL HISTORY: Past Medical History:  Diagnosis Date  . Atypical chest pain   . CHF (congestive heart failure) ([place])   . Dizziness   . GERD (gastroesophageal reflux disease)   . Gout   . HLD (hyperlipidemia)   . Hypertension   . IFG (impaired fasting glucose)   . Obesity   . OSA on CPAP   . PVC (premature ventricular contraction)   . Shortness of breath dyspnea     PAST SURGICAL HISTORY: Past Surgical History:  Procedure Laterality Date  . CARDIAC CATHETERIZATION N/A 07/01/2015   Procedure: Left Heart Cath;  Surgeon: Adrian Prows, MD;  Location: [facility];  Service: Cardiovascular;  Laterality: N/A;  . CARPAL TUNNEL RELEASE    . ELECTROPHYSIOLOGIC STUDY N/A 02/05/2016   Procedure: Atrial Fibrillation Ablation;  Surgeon: Will Meredith Leeds, MD;  Location: [facility];  Service:  Cardiovascular;  Laterality: N/A;  . NASAL SEPTOPLASTY W/ TURBINOPLASTY Bilateral 09/14/2016   Procedure: NASAL SEPTOPLASTY WITH BILATERAL  TURBINATE REDUCTION;  Surgeon: Leta Baptist, MD;  Location: [place];  Service: ENT;  Laterality: Bilateral;    FAMILY HISTORY: Family History  Problem Relation Age of Onset  . Seizures Mother   . Deep vein thrombosis Mother   . COPD Mother   . Heart failure Mother   . Lung cancer Mother   . Prostate cancer Father   . Hypertension Father   . Diabetes Father   . Stroke Paternal Grandfather   . Heart attack Neg Hx     SOCIAL HISTORY: Social History   Socioeconomic History  . Marital status: Married    Spouse name: Lauro Regulus  . Number of children: 1  . Years of education: HS  . Highest education level: Not on file  Social Needs  . Financial resource strain: Not on file  . Food insecurity - worry: Not on file  . Food insecurity - inability: Not on file  . Transportation needs - medical: Not on file  . Transportation needs - non-medical: Not on file  Occupational History  . Occupation: [employer]  Tobacco Use  . Smoking status: Never Smoker  . Smokeless tobacco: Never Used  Substance and Sexual Activity  . Alcohol use: Yes    Alcohol/week: 0.6 oz    Types: 1 Glasses of wine per week    Comment: Rare occasion  . Drug use: No  . Sexual activity: Yes  Other Topics Concern  . Not on file  Social History Narrative   Drinks 1 Mt Dew 2 days a week      PHYSICAL EXAM  Vitals:   02/15/18 0825  BP: 135/85  Pulse: 81  Weight: 246 lb (111.6 kg)   Body mass index is 39.71 kg/m.  Generalized: Well developed, Obese male in no acute distress  Head: normocephalic and atraumatic,. Oropharynx benign  Neck: Supple, Cardiac: Regular rate rhythm, no murmur  Musculoskeletal: No deformity   Neurological examination   Mentation: Alert oriented to time, place, history taking. Attention span and concentration appropriate. Recent and  remote memory intact.  Follows all commands speech and language fluent. ESS 4  Cranial nerve II-XII: Pupils  were equal round reactive to light extraocular movements were full, visual field were full on confrontational test. Facial sensation and strength were normal. hearing was intact to finger rubbing bilaterally. Uvula tongue midline. head turning and shoulder shrug were normal and symmetric.Tongue protrusion into cheek strength was normal. Motor: normal bulk and tone, full strength in the BUE, BLE, fine finger movements normal, no pronator drift. No focal weakness Sensory: normal and symmetric to light touch,  Coordination: finger-nose-finger, heel-to-shin bilaterally, no dysmetria Gait and Station: Rising up from seated position without assistance, normal stance,  moderate stride, good arm swing, smooth turning, able to perform tiptoe, and heel walking without difficulty. Tandem gait is steady  DIAGNOSTIC DATA (LABS, IMAGING, TESTING) - I reviewed patient records, labs, notes, testing and imaging myself where available.  Lab Results  Component Value Date   WBC 5.8 01/27/2016   HGB 13.8 01/27/2016   HCT 41.7 01/27/2016   MCV 79.0 01/27/2016   PLT 182 01/27/2016      Component Value Date/Time   NA 140 01/27/2016 0840   K 4.2 01/27/2016 0840   CL 104 01/27/2016 0840   CO2 28 01/27/2016 0840   GLUCOSE 91 01/27/2016 0840   BUN 13 01/27/2016 0840   CREATININE 0.93 01/27/2016 0840   CALCIUM 8.8 01/27/2016 0840   PROT 6.6 12/07/2015 1330   ALBUMIN 3.7 12/07/2015 1330   AST 19 12/07/2015 1330   ALT 28 12/07/2015 1330   ALKPHOS 47 12/07/2015 1330   BILITOT 0.8 12/07/2015 1330   GFRNONAA >60 12/10/2015 0234   GFRAA >60 12/10/2015 0234   Lab Results  Component Value Date   CHOL 166 12/08/2015   HDL 37 (L) 12/08/2015   LDLCALC 111 (H) 12/08/2015   TRIG 89 12/08/2015   CHOLHDL 4.5 12/08/2015    ASSESSMENT AND PLAN  52 y.o. year old male  has a past medical history of  hyperlipidemia, gout, obesity, prediabetes, hypertension and nasal congestion, status post recent nasal turbinate reduction and septoplasty in October 2017, who presents for follow-up consultation of his severe obstructive sleep apnea .Data dated 01/16/2018-02/14/2018 shows compliance greater than 4 hours at 93%.  Average usage 5 hours 59 minutes.  Set pressure 15 cm EPR level 1 AHI 1.7 leak 95th percentile 7.6.  ESS 4.    PLAN: CPAP compliance data received after patient left Compliance data 93% greater than 4 hours We will make no change to setting Follow-up in 6-8 months Portions of the above documentation or copy from previous visit for review purposes Dennie Bible, [facility], [facility], [facility] [address], [place], [place] [address] [phone]  I reviewed the above note and documentation by the Nurse Practitioner and agree with the history, physical exam, assessment and plan as outlined above. I was immediately available for face-to-face consultation. Star Age, MD, PhD [facility])

## 2018-02-14 ENCOUNTER — Encounter: Payer: Self-pay | Admitting: Nurse Practitioner

## 2018-02-15 ENCOUNTER — Encounter (INDEPENDENT_AMBULATORY_CARE_PROVIDER_SITE_OTHER): Payer: Self-pay

## 2018-02-15 ENCOUNTER — Telehealth: Payer: Self-pay | Admitting: Nurse Practitioner

## 2018-02-15 ENCOUNTER — Ambulatory Visit (INDEPENDENT_AMBULATORY_CARE_PROVIDER_SITE_OTHER): Payer: BLUE CROSS/BLUE SHIELD | Admitting: Nurse Practitioner

## 2018-02-15 ENCOUNTER — Encounter: Payer: Self-pay | Admitting: Nurse Practitioner

## 2018-02-15 VITALS — BP 135/85 | HR 81 | Wt 246.0 lb

## 2018-02-15 DIAGNOSIS — G4733 Obstructive sleep apnea (adult) (pediatric): Secondary | ICD-10-CM

## 2018-02-15 NOTE — Patient Instructions (Signed)
Unable to get download  Will check with equipment company Will call pt if adjustments needed

## 2018-02-15 NOTE — Telephone Encounter (Signed)
Per Enid Skeens, NP, spoke with patient and informed him his CPAP compliance is good. He verbalized understanding, appreciation.

## 2018-02-15 NOTE — Telephone Encounter (Signed)
Spoke with patient and informed him that he does not need to take CPAP to [facility], issue is resolved with wireless download of compliance data. He verbalized understanding, appreciation.

## 2018-02-15 NOTE — Telephone Encounter (Signed)
Andy/[employer] [phone] returned Todd [name]'s call. He said to advise he fixed it and she re pull his download. FYi

## 2018-02-23 DIAGNOSIS — G4733 Obstructive sleep apnea (adult) (pediatric): Secondary | ICD-10-CM | POA: Diagnosis not present

## 2018-03-15 DIAGNOSIS — M222X1 Patellofemoral disorders, right knee: Secondary | ICD-10-CM | POA: Diagnosis not present

## 2018-03-15 DIAGNOSIS — R350 Frequency of micturition: Secondary | ICD-10-CM | POA: Diagnosis not present

## 2018-03-15 DIAGNOSIS — N390 Urinary tract infection, site not specified: Secondary | ICD-10-CM | POA: Diagnosis not present

## 2018-03-15 DIAGNOSIS — M545 Low back pain: Secondary | ICD-10-CM | POA: Diagnosis not present

## 2018-03-15 DIAGNOSIS — R109 Unspecified abdominal pain: Secondary | ICD-10-CM | POA: Diagnosis not present

## 2018-03-22 ENCOUNTER — Ambulatory Visit (INDEPENDENT_AMBULATORY_CARE_PROVIDER_SITE_OTHER): Payer: BLUE CROSS/BLUE SHIELD | Admitting: Cardiology

## 2018-03-22 ENCOUNTER — Encounter: Payer: Self-pay | Admitting: Cardiology

## 2018-03-22 VITALS — BP 110/72 | HR 73 | Ht 66.0 in | Wt 241.0 lb

## 2018-03-22 DIAGNOSIS — I481 Persistent atrial fibrillation: Secondary | ICD-10-CM

## 2018-03-22 DIAGNOSIS — I10 Essential (primary) hypertension: Secondary | ICD-10-CM

## 2018-03-22 DIAGNOSIS — I493 Ventricular premature depolarization: Secondary | ICD-10-CM

## 2018-03-22 DIAGNOSIS — G4733 Obstructive sleep apnea (adult) (pediatric): Secondary | ICD-10-CM | POA: Diagnosis not present

## 2018-03-22 DIAGNOSIS — I428 Other cardiomyopathies: Secondary | ICD-10-CM | POA: Diagnosis not present

## 2018-03-22 DIAGNOSIS — I4819 Other persistent atrial fibrillation: Secondary | ICD-10-CM

## 2018-03-22 NOTE — Progress Notes (Signed)
Electrophysiology Office Note   Date:  03/22/2018   ID:  Todd Paradise., DOB June 12, 1966, MRN [id number]  PCP:  Martha Clan, MD  Cardiologist:  [name] Primary Electrophysiologist:  Todd Sandusky [name], MD    Chief Complaint  Patient presents with  . Follow-up    Persistent Afib     History of Present Illness: Todd Palinkas. is a 52 y.o. male who presents today for electrophysiology evaluation.   He has a history of systolic HF with an EF of 30%. He had a cath which showed no CAD.[name] He also has OSA and is on CPAP, HTN, HLD, morbid obesity, as well as occasional PVCs. He presents today for follow-up of his atrial fibrillation. He had ablation on 02/05/16.  He has been feeling well since last being seen. He is able to do most of his daily activities without issue. He does have a few episodes of palpitations that last up to 15 seconds, but none that are prolonged.  Today, denies symptoms of palpitations, chest pain, shortness of breath, orthopnea, PND, lower extremity edema, claudication, dizziness, presyncope, syncope, bleeding, or neurologic sequela. The patient is tolerating medications without difficulties.  He feels well.  No major complaints today.   Past Medical History:  Diagnosis Date  . Atypical chest pain   . CHF (congestive heart failure) ([facility])   . Dizziness   . GERD (gastroesophageal reflux disease)   . Gout   . HLD (hyperlipidemia)   . Hypertension   . IFG (impaired fasting glucose)   . Obesity   . OSA on CPAP   . PVC (premature ventricular contraction)   . Shortness of breath dyspnea    Past Surgical History:  Procedure Laterality Date  . CARDIAC CATHETERIZATION N/A 07/01/2015   Procedure: Left Heart Cath;  Surgeon: Todd Decamp, MD;  Location: [facility];  Service: Cardiovascular;  Laterality: N/A;  . CARPAL TUNNEL RELEASE    . ELECTROPHYSIOLOGIC STUDY N/A 02/05/2016   Procedure: Atrial Fibrillation Ablation;  Surgeon: Todd Escareno [name], MD;   Location: [facility];  Service: Cardiovascular;  Laterality: N/A;  . NASAL SEPTOPLASTY W/ TURBINOPLASTY Bilateral 09/14/2016   Procedure: NASAL SEPTOPLASTY WITH BILATERAL  TURBINATE REDUCTION;  Surgeon: Todd Pies, MD;  Location: [facility];  Service: ENT;  Laterality: Bilateral;     Current Outpatient Medications  Medication Sig Dispense Refill  . acetaminophen (TYLENOL) 500 MG tablet Take 1,000 mg by mouth daily as needed for headache.    [name] atorvastatin (LIPITOR) 10 MG tablet 10 mg daily.    . carvedilol (COREG) 25 MG tablet Take 25 mg by mouth 2 (two) times daily with a meal.    . Cholecalciferol (VITAMIN D) 2000 units tablet Take 2,000 Units by mouth daily.    . diclofenac sodium (VOLTAREN) 1 % GEL Use as directed    . dofetilide (TIKOSYN) 500 MCG capsule Take 1 capsule (500 mcg total) by mouth every 12 (twelve) hours. 14 capsule 0  . eplerenone (INSPRA) 25 MG tablet Take 25 mg by mouth daily.    [name] olmesartan (BENICAR) 40 MG tablet Take 40 mg by mouth at bedtime.    [name] omeprazole (PRILOSEC) 20 MG capsule Take 20 mg by mouth daily.    . rivaroxaban (XARELTO) 20 MG TABS tablet Take 20 mg by mouth daily.     No current facility-administered medications for this visit.     Allergies:   Metoprolol; Valsartan; Azithromycin; and Entresto [sacubitril-valsartan]  Social History:  The patient  reports that he has never smoked. He has never used smokeless tobacco. He reports that he drinks about 0.6 oz of alcohol per week. He reports that he does not use drugs.   Family History:  The patient's family history includes COPD in his mother; Deep vein thrombosis in his mother; Diabetes in his father; Heart failure in his mother; Hypertension in his father; Lung cancer in his mother; Prostate cancer in his father; Seizures in his mother; Stroke in his paternal grandfather.    ROS:  Please see the history of present illness.   Otherwise, review of systems is positive for leg pain,  back pain, muscle pain.   All other systems are reviewed and negative.   PHYSICAL EXAM: VS:  BP 110/72   Pulse 73   Ht 5\' 6"  (1.676 m)   Wt 241 lb (109.3 kg)   SpO2 94%   BMI 38.90 kg/m  , BMI Body mass index is 38.9 kg/m. GEN: Well nourished, well developed, in no acute distress  HEENT: normal  Neck: no JVD, carotid bruits, or masses Cardiac: RRR; no murmurs, rubs, or gallops,no edema  Respiratory:  clear to auscultation bilaterally, normal work of breathing GI: soft, nontender, nondistended, + BS MS: no deformity or atrophy  Skin: warm and dry Neuro:  Strength and sensation are intact Psych: euthymic mood, full affect  EKG:  EKG is ordered today. Personal review of the ekg ordered shows sinus rhythm, PVCs   Recent Labs: No results found for requested labs within last 8760 hours.    Lipid Panel     Component Value Date/Time   CHOL 166 12/08/2015 0352   TRIG 89 12/08/2015 0352   HDL 37 (L) 12/08/2015 0352   CHOLHDL 4.5 12/08/2015 0352   VLDL 18 12/08/2015 0352   LDLCALC 111 (H) 12/08/2015 0352     Wt Readings from Last 3 Encounters:  03/22/18 241 lb (109.3 kg)  02/15/18 246 lb (111.6 kg)  09/21/17 249 lb 6.4 oz (113.1 kg)      Other studies Reviewed: Additional studies/ records that were reviewed today include: TTE 12/08/15  Review of the above records today demonstrates:  - Left ventricle: The cavity size was mildly dilated. There was mild concentric hypertrophy. Systolic function was severely reduced. The estimated ejection fraction was in the range of 20% to 25%. Diffuse hypokinesis. Features are consistent with a pseudonormal left ventricular filling pattern, with concomitant abnormal relaxation and increased filling pressure (grade 2 diastolic dysfunction). Doppler parameters are consistent with both elevated ventricular end-diastolic filling pressure and elevated left atrial filling pressure. - Mitral valve: There was mild to moderate  regurgitation.  TTE 05/26/16 -Left ventricle cavity is moderately dilated at 6.5 cm. Moderate concentric LVH. Mild diffuse hypokinesis. Normal diastolic filling pattern. Ejection fraction 43% -Left atrial cavity mildly dilated -Grade 1 mitral regurgitation  ASSESSMENT AND PLAN:  1.  Persistent atrial fibrillation: He on Xarelto and dofetilide.  Ablation 02/05/2016.  Has not noted any further atrial fibrillation.  Continue with current management.  He Shakir Petrosino have labs checked with his primary cardiologist.    This patients CHA2DS2-VASc Score and unadjusted Ischemic Stroke Rate (% per year) is equal to 2.2 % stroke rate/year from a score of 2  Above score calculated as 1 point each if present [CHF, HTN, DM, Vascular=MI/PAD/Aortic Plaque, Age if 65-74, or Male] Above score calculated as 2 points each if present [Age > 75, or Stroke/TIA/TE]   2. Hypertension: Well-controlled.  No changes.  3. Chronic congestive heart failure: Ejection fraction has improved since ablation.  Well compensated today.  No changes.    4. Obstructive sleep apnea: Compliant with CPAP.  5.  PVCs: Appear to be outflow tract related on his EKG today.  He is asymptomatic.  No changes.  Current medicines are reviewed at length with the patient today.   The patient does not have concerns regarding his medicines.  The following changes were made today:  none  Labs/ tests ordered today include:  Orders Placed This Encounter  Procedures  . EKG 12-Lead     Disposition:   FU with Jefrey Raburn 12 months  Signed, Hikaru Delorenzo [name], MD  03/22/2018 11:21 AM     [facility] [address] [place] [address] [phone] (office) [phone] (fax)

## 2018-03-22 NOTE — Patient Instructions (Addendum)
Medication Instructions:  Your physician recommends that you continue on your current medications as directed. Please refer to the Current Medication list given to you today.  Labwork: None ordered  Testing/Procedures: None ordered  Follow-Up: Your physician wants you to follow-up in: 1 year with Dr. Elberta Fortis.  You will receive a reminder letter in the mail two months in advance. If you don't receive a letter, please call our office to schedule the follow-up appointment.  * If you need a refill on your cardiac medications before your next appointment, please call your pharmacy.   *Please note that any paperwork needing to be filled out by the provider will need to be addressed at the front desk prior to seeing the provider. Please note that any FMLA, disability or other documents regarding health condition is subject to a $25.00 charge that must be received prior to completion of paperwork in the form of a money order or check.  Thank you for choosing [facility]!!   Dory Horn, RN [phone]  Any Other Special Instructions Will Be Listed Below (If Applicable). Make sure to get your Magnesium level and a BMP next time you see Dr. [name].

## 2018-03-27 DIAGNOSIS — G4733 Obstructive sleep apnea (adult) (pediatric): Secondary | ICD-10-CM | POA: Diagnosis not present

## 2018-04-26 DIAGNOSIS — G4733 Obstructive sleep apnea (adult) (pediatric): Secondary | ICD-10-CM | POA: Diagnosis not present

## 2018-05-17 ENCOUNTER — Ambulatory Visit (INDEPENDENT_AMBULATORY_CARE_PROVIDER_SITE_OTHER): Payer: BLUE CROSS/BLUE SHIELD

## 2018-05-17 ENCOUNTER — Ambulatory Visit (INDEPENDENT_AMBULATORY_CARE_PROVIDER_SITE_OTHER): Payer: BLUE CROSS/BLUE SHIELD | Admitting: Orthopaedic Surgery

## 2018-05-17 ENCOUNTER — Encounter (INDEPENDENT_AMBULATORY_CARE_PROVIDER_SITE_OTHER): Payer: Self-pay | Admitting: Orthopaedic Surgery

## 2018-05-17 DIAGNOSIS — M25561 Pain in right knee: Secondary | ICD-10-CM | POA: Diagnosis not present

## 2018-05-17 DIAGNOSIS — M5441 Lumbago with sciatica, right side: Secondary | ICD-10-CM | POA: Diagnosis not present

## 2018-05-17 DIAGNOSIS — G8929 Other chronic pain: Secondary | ICD-10-CM

## 2018-05-17 NOTE — Progress Notes (Signed)
Office Visit Note   Patient: Todd [name].           Date of Birth: 1966-01-27           MRN: [id number] Visit Date: 05/17/2018              Requested by: Todd Clan, MD [address] [place], [place] [address] PCP: Todd Clan, MD   Assessment & Plan: Visit Diagnoses:  1. Chronic right-sided low back pain with right-sided sciatica   2. Chronic pain of right knee     Plan: Offered right knee cortisone injection he defers.  Therefore we Todd have him do quad strengthening knees exercises are shown that the patient had an demonstrate them back to me today.  In regards to his back and was given a handout on back exercises and stretching exercises particularly for his hamstrings.  He was offered formal therapy for his knee and back he defers.  Therefore we Todd see him back on an as-needed basis.  Questions encouraged and answered by Todd [name] and myself.  Follow-Up Instructions: Return if symptoms worsen or fail to improve.   Orders:  Orders Placed This Encounter  Procedures  . XR Lumbar Spine 2-3 Views  . XR Knee 1-2 Views Right   No orders of the defined types were placed in this encounter.     Procedures: No procedures performed   Clinical Data: No additional findings.   Subjective: Chief Complaint  Patient presents with  . Right Knee - Pain    HPI Todd [name] is a 52 year old male comes in today due to low back pain mainly on the right.  Pain is been ongoing for the past couple months.  No known injury.  He denies any numbness tingling down either leg.  He did have some nighttime pain which is very short-lived.  He has had no bowel bladder dysfunction.  He states he has good and bad days.  He notes that placing a pillow behind his back when seated does seem to help alleviate some of the back pain.  Currently he is having no significant back pain.  Right knee pain for the last 1 to 2 years.  Over the past couple months is been getting worse but over the  last 3 weeks his knee pain is gotten better.  Pain is worse particularly later in the day.  He denies any mechanical symptoms of the knee.  He feels the knee does swell at times.  He is taken Tylenol.  He does have a history of gout and is taking colchicine and indomethacin in the past which seemed to help with his knee pain however now the fact that he is has A. fib he is unable to take any NSAIDs. Review of Systems No fevers chills shortness of breath chest pain.  Objective: Vital Signs: There were no vitals taken for this visit.  Physical Exam  Constitutional: He is oriented to person, place, and time. He appears well-developed and well-nourished. No distress.  Cardiovascular: Intact distal pulses.  Pulmonary/Chest: Effort normal.  Neurological: He is alert and oriented to person, place, and time.  Skin: He is not diaphoretic.  Psychiatric: He has a normal mood and affect.    Ortho Exam Lumbar spine is nontender about the lumbar spinal column paraspinous region.  Negative straight leg raise bilaterally.  Tight hamstrings bilaterally.  He comes within 2 to 3 inches pain and Todd touch his toes with forward flexion has limited extension.  5 out of 5 strength throughout the lower extremities against resistance.  Good range of motion bilateral hips without pain.  Able to walk on his tiptoes and heels.  Deep tendon reflexes are 2+ at the knees and 1+ at the ankles and equal and symmetric.  Sensation grossly intact bilateral feet light touch.  Bilateral knees he has full range of motion bilateral knees.  Slight crepitus patellofemoral region with range of motion of the right knee.  No instability with valgus varus stressing of either knee.  No effusion abnormal warmth of either knee.  Anterior drawer is negative bilaterally McMurray's is negative bilaterally. Specialty Comments:  No specialty comments available.  Imaging: Xr Knee 1-2 Views Right  Result Date: 05/17/2018 Right knee AP lateral  views: Mild patellofemoral arthritic changes.  Knee is well located.  No acute fractures.  Otherwise knee is well preserved.   Xr Lumbar Spine 2-3 Views  Result Date: 05/17/2018 Lumbar spine AP lateral views: Disc space overall well-maintained.  Slight retro-listhesis L2 on L3.  Otherwise minimal degenerative changes particularly in the upper lumbar spine.  No acute fractures.  No bony abnormality is otherwise.  Bilateral hips are well located on the AP view    PMFS History: Patient Active Problem List   Diagnosis Date Noted  . S/P nasal septoplasty 09/14/2016  . AF (atrial fibrillation) (HCC) 02/05/2016  . Hyperglycemia 12/07/2015  . Paroxysmal atrial fibrillation (HCC) 12/07/2015  . Obstructive sleep apnea hypopnea, severe 08/07/2015  . Congestive dilated cardiomyopathy (HCC) 07/01/2015   Past Medical History:  Diagnosis Date  . Atypical chest pain   . CHF (congestive heart failure) (HCC)   . Dizziness   . GERD (gastroesophageal reflux disease)   . Gout   . HLD (hyperlipidemia)   . Hypertension   . IFG (impaired fasting glucose)   . Obesity   . OSA on CPAP   . PVC (premature ventricular contraction)   . Shortness of breath dyspnea     Family History  Problem Relation Age of Onset  . Seizures Mother   . Deep vein thrombosis Mother   . COPD Mother   . Heart failure Mother   . Lung cancer Mother   . Prostate cancer Father   . Hypertension Father   . Diabetes Father   . Stroke Paternal Grandfather   . Heart attack Neg Hx     Past Surgical History:  Procedure Laterality Date  . CARDIAC CATHETERIZATION N/A 07/01/2015   Procedure: Left Heart Cath;  Surgeon: Todd Decamp, MD;  [facility]: [facility] INVASIVE CV LAB;  Service: Cardiovascular;  Laterality: N/A;  . CARPAL TUNNEL RELEASE    . ELECTROPHYSIOLOGIC STUDY N/A 02/05/2016   Procedure: Atrial Fibrillation Ablation;  Surgeon: Todd [name], MD;  Location: [facility] INVASIVE CV LAB;  Service: Cardiovascular;  Laterality: N/A;  . NASAL  SEPTOPLASTY W/ TURBINOPLASTY Bilateral 09/14/2016   Procedure: NASAL SEPTOPLASTY WITH BILATERAL  TURBINATE REDUCTION;  Surgeon: Todd Pies, MD;  Location: [facility];  Service: ENT;  Laterality: Bilateral;   Social History   Occupational History  . Occupation: [employer]  Tobacco Use  . Smoking status: Never Smoker  . Smokeless tobacco: Never Used  Substance and Sexual Activity  . Alcohol use: Yes    Alcohol/week: 0.6 oz    Types: 1 Glasses of wine per week    Comment: Rare occasion  . Drug use: No  . Sexual activity: Yes

## 2018-05-26 DIAGNOSIS — G4733 Obstructive sleep apnea (adult) (pediatric): Secondary | ICD-10-CM | POA: Diagnosis not present

## 2018-05-30 DIAGNOSIS — H5021 Vertical strabismus, right eye: Secondary | ICD-10-CM | POA: Diagnosis not present

## 2018-05-30 DIAGNOSIS — H524 Presbyopia: Secondary | ICD-10-CM | POA: Diagnosis not present

## 2018-05-30 DIAGNOSIS — H52203 Unspecified astigmatism, bilateral: Secondary | ICD-10-CM | POA: Diagnosis not present

## 2018-05-30 DIAGNOSIS — H5213 Myopia, bilateral: Secondary | ICD-10-CM | POA: Diagnosis not present

## 2018-06-15 DIAGNOSIS — I480 Paroxysmal atrial fibrillation: Secondary | ICD-10-CM | POA: Diagnosis not present

## 2018-06-15 DIAGNOSIS — I428 Other cardiomyopathies: Secondary | ICD-10-CM | POA: Diagnosis not present

## 2018-06-15 DIAGNOSIS — I5032 Chronic diastolic (congestive) heart failure: Secondary | ICD-10-CM | POA: Diagnosis not present

## 2018-06-15 DIAGNOSIS — I10 Essential (primary) hypertension: Secondary | ICD-10-CM | POA: Diagnosis not present

## 2018-06-26 DIAGNOSIS — G4733 Obstructive sleep apnea (adult) (pediatric): Secondary | ICD-10-CM | POA: Diagnosis not present

## 2018-06-27 DIAGNOSIS — N448 Other noninflammatory disorders of the testis: Secondary | ICD-10-CM | POA: Diagnosis not present

## 2018-07-05 ENCOUNTER — Other Ambulatory Visit: Payer: Self-pay | Admitting: Urology

## 2018-07-20 DIAGNOSIS — I429 Cardiomyopathy, unspecified: Secondary | ICD-10-CM | POA: Diagnosis not present

## 2018-07-26 DIAGNOSIS — G4733 Obstructive sleep apnea (adult) (pediatric): Secondary | ICD-10-CM | POA: Diagnosis not present

## 2018-08-21 ENCOUNTER — Ambulatory Visit: Payer: BLUE CROSS/BLUE SHIELD | Admitting: Nurse Practitioner

## 2018-08-25 DIAGNOSIS — G4733 Obstructive sleep apnea (adult) (pediatric): Secondary | ICD-10-CM | POA: Diagnosis not present

## 2018-09-13 DIAGNOSIS — N448 Other noninflammatory disorders of the testis: Secondary | ICD-10-CM | POA: Diagnosis not present

## 2018-09-15 ENCOUNTER — Other Ambulatory Visit (HOSPITAL_COMMUNITY): Payer: BLUE CROSS/BLUE SHIELD

## 2018-09-18 ENCOUNTER — Ambulatory Visit (HOSPITAL_COMMUNITY): Admit: 2018-09-18 | Payer: BLUE CROSS/BLUE SHIELD | Admitting: Urology

## 2018-09-18 ENCOUNTER — Encounter (HOSPITAL_COMMUNITY): Payer: Self-pay

## 2018-09-18 SURGERY — EXPLORATION, SCROTUM
Anesthesia: General

## 2018-09-25 DIAGNOSIS — G4733 Obstructive sleep apnea (adult) (pediatric): Secondary | ICD-10-CM | POA: Diagnosis not present

## 2018-10-12 DIAGNOSIS — I10 Essential (primary) hypertension: Secondary | ICD-10-CM | POA: Diagnosis not present

## 2018-10-12 DIAGNOSIS — S93501A Unspecified sprain of right great toe, initial encounter: Secondary | ICD-10-CM | POA: Diagnosis not present

## 2018-10-12 DIAGNOSIS — M79671 Pain in right foot: Secondary | ICD-10-CM | POA: Diagnosis not present

## 2018-10-12 DIAGNOSIS — Z6838 Body mass index (BMI) 38.0-38.9, adult: Secondary | ICD-10-CM | POA: Diagnosis not present

## 2018-10-24 DIAGNOSIS — Z125 Encounter for screening for malignant neoplasm of prostate: Secondary | ICD-10-CM | POA: Diagnosis not present

## 2018-10-24 DIAGNOSIS — Z0000 Encounter for general adult medical examination without abnormal findings: Secondary | ICD-10-CM | POA: Diagnosis not present

## 2018-10-24 DIAGNOSIS — M79671 Pain in right foot: Secondary | ICD-10-CM | POA: Diagnosis not present

## 2018-10-24 DIAGNOSIS — R7301 Impaired fasting glucose: Secondary | ICD-10-CM | POA: Diagnosis not present

## 2018-10-24 DIAGNOSIS — M109 Gout, unspecified: Secondary | ICD-10-CM | POA: Diagnosis not present

## 2018-10-24 DIAGNOSIS — I10 Essential (primary) hypertension: Secondary | ICD-10-CM | POA: Diagnosis not present

## 2018-10-24 DIAGNOSIS — E559 Vitamin D deficiency, unspecified: Secondary | ICD-10-CM | POA: Diagnosis not present

## 2018-10-24 DIAGNOSIS — R350 Frequency of micturition: Secondary | ICD-10-CM | POA: Diagnosis not present

## 2018-10-24 DIAGNOSIS — Z6838 Body mass index (BMI) 38.0-38.9, adult: Secondary | ICD-10-CM | POA: Diagnosis not present

## 2018-10-25 DIAGNOSIS — G4733 Obstructive sleep apnea (adult) (pediatric): Secondary | ICD-10-CM | POA: Diagnosis not present

## 2018-11-02 DIAGNOSIS — I5022 Chronic systolic (congestive) heart failure: Secondary | ICD-10-CM | POA: Diagnosis not present

## 2018-11-02 DIAGNOSIS — Z23 Encounter for immunization: Secondary | ICD-10-CM | POA: Diagnosis not present

## 2018-11-02 DIAGNOSIS — Z1331 Encounter for screening for depression: Secondary | ICD-10-CM | POA: Diagnosis not present

## 2018-11-02 DIAGNOSIS — Z125 Encounter for screening for malignant neoplasm of prostate: Secondary | ICD-10-CM | POA: Diagnosis not present

## 2018-11-02 DIAGNOSIS — I10 Essential (primary) hypertension: Secondary | ICD-10-CM | POA: Diagnosis not present

## 2018-11-02 DIAGNOSIS — R7301 Impaired fasting glucose: Secondary | ICD-10-CM | POA: Diagnosis not present

## 2018-11-02 DIAGNOSIS — Z0000 Encounter for general adult medical examination without abnormal findings: Secondary | ICD-10-CM | POA: Diagnosis not present

## 2018-11-02 DIAGNOSIS — E7849 Other hyperlipidemia: Secondary | ICD-10-CM | POA: Diagnosis not present

## 2018-11-24 DIAGNOSIS — G4733 Obstructive sleep apnea (adult) (pediatric): Secondary | ICD-10-CM | POA: Diagnosis not present

## 2018-11-27 NOTE — Progress Notes (Addendum)
[facility]  PATIENT: Todd [name]. DOB: Jun 21, 1966   REASON FOR VISIT: Obstructive sleep apnea for CPAP compliance HISTORY FROM: Patient    HISTORY OF PRESENT ILLNESS: Todd [name] is a 52 year old right-handed gentleman with an underlying medical history of reflux disease, hyperlipidemia, gout, PVCs, dizziness, impaired fasting glucose, hypertension and obesity, as well as recent chest pain, who presents for follow-up consultation of his OSA after a long gap of nearly 2 years. The patient is unaccompanied today. I first met him on 05/27/2015 at the request of his cardiologist, at which time the patient reported a prior diagnosis of OSA. He reported difficulty with CPAP in the past and he stopped using CPAP several years prior. I invited him for sleep study. He had a baseline sleep study, followed by a CPAP titration study. I went over his test results with him in detail today. His baseline sleep study from 06/24/2015 showed a sleep efficiency of 44% only, sleep latency was prolonged at 40.5 minutes and wake after sleep onset was high at 3 hours. He had severe sleep fragmentation. He had absence of slow-wave sleep and absence of REM sleep. He had frequent PVCs and PACs on EKG. He had mild to moderate snoring. Total AHI was highly elevated at 83.3 per hour, average oxygen saturation was 92%, nadir was 82% during non-REM sleep. He had no significant PLMS. Based on his medical history and his test results I invited him for a full night CPAP titration study. He had this on 07/17/2015. Sleep efficiency was 61.1%, sleep latency was 72 minutes, wake after sleep onset was 82 minutes. He had a normal percentage of slow-wave sleep and a mildly reduced percentage of REM sleep at 15.9% with a prolonged REM latency of 174 minutes. He had frequent PVCs and PACs on single-lead EKG. CPAP was titrated from 4 cm to 18 cm, AHI was reduced to 3.8 per hour at the final pressure. Supine REM  sleep was achieved. Average oxygen saturation was 94%, nadir was 78%. O2 nadir was 92% during supine REM sleep on the final pressure of CPAP. He was fitted with a full facemask. Based on his test results are prescribed CPAP therapy for home use. He did not return for follow-up since then.  Today, 3/14/2018SA (all dictated new, as well as above notes, some dictation done in note pad or Word, outside of chart, may appear as copied): I reviewed his CPAP compliance data from 01/09/2017 through 02/07/2017 which is a total of 30 days, during which time he used his CPAP only 21 days with percent used days greater than 4 hours at 20% only, indicating poor compliance with an average usage of 3 hours and 15 minutes only, residual AHI 3.1 per hour, leak acceptable with the 95th percentile at 7.9 L/m on a pressure of 15 cm with EPR of 3. He reports that he has still trouble adjusting to CPAP. The pressure currently does not bother him, he had a total for talking with a nasal pillows, especially after his surgery and uses a nasal mask now. His sleep is restless, he has a tendency to miss with the mask take it off in the middle of the night, not realizing it. When he is awakened realizes that he pulled off his mask or if he goes to the bathroom and comes back he tries to put the mask back on. He has skipped several nights in the past 30 and in the past 90 days, he uses the machine  about 70% of the time but most nights averaging less than 4 hours. He does report feeling tired during the day, he works 3 12-hour shifts on weekend nights and during the work week he is able to take care of his daughter who is 5. His wife works during the week, day shift. He had in the interim nasal surgery under Dr. [name]. He is status post septoplasty and turbinate reduction on 09/14/2016.  He is had a cold off and on, overall nasal congestion and nasal breathing are much improved after nasal surgery. He tries to use the reference that he was  given by the ENT surgeon.  UPDATE 06/14/2018CM Todd [name], 52 year old [name] for follow-up CPAP compliance. Usage days 25/30 or 83%. Greater than 4 hours 57% for 17 days less than 4 hours a day for 27%. Average usage 4 hours 82mn. 15 cm of pressure EPR level AHI 3.5 He continues to work 3 12 hour shifts on the weekend nights. He has just obtained a new nasal prongs this week from his supplier. ESS 3 FSS 19 He returns for reevaluation UPDATE   9/18/218CM  Todd [name]  for followup CPAP compliance.  Compliance data  Dated 8/1 /2018- 08/302018 shows usage greater than 4 hours for 22 days or 73%.average usage 5 hours and 9 minutes. Set pressure 15 cm. AHI 3.3. He continues to work night shift Saturday through Monday. ESS score 5. He denies any daytime drowsiness.  He returns for reevauation UPDATE 3/20/2019CM Todd [name] 52year old [name] for follow-up with history of obstructive sleep apnea for CPAP compliance.  He has not been having issues with his machine he did have a cold and did not use the machine for a couple of days in the last month.  Compliance data dated 01/16/2018-02/14/2018 shows compliance greater than 4 hours at 93%.  Average usage 5 hours 59 minutes.  Set pressure 15 cm EPR level 1 AHI 1.7 leak 95th percentile 7.6.  ESS 4.  He returns for reevaluation UPDATE 1/2/2020CM Todd [name] for follow-up with history of obstructive sleep apnea here for CPAP compliance.  He reports he has not been sleeping well the last couple of weeks.  His wife fell and suffered a subdural hematoma.  He has been anxious over this.  He continues to work 12-hour night shifts.  Compliance data dated 10/30/2018-11/28/2018 shows compliance greater than 4 hours at 100%.  Average usage 6 hours 14 minutes.  Set pressure 17.6 cm AHI 1.6.  He returns for reevaluation REVIEW OF SYSTEMS: Full 14 system review of systems performed and notable only for those listed, all  others are neg:  Constitutional: neg  Cardiovascular: neg Ear/Nose/Throat: neg  Skin: neg Eyes: neg Respiratory: neg Gastroitestinal: neg  Hematology/Lymphatic: neg  Endocrine: neg Musculoskeletal:neg Allergy/Immunology: neg Neurological: neg Psychiatric: neg Sleep : Obstructive sleep apnea with CPAP   ALLERGIES: Allergies  Allergen Reactions  . Metoprolol Other (See Comments)    Lethargy, didn't feel like doing anything (tolerates low dose carvedilol)  . Valsartan Other (See Comments)    Increased blood pressure  . Azithromycin Itching and Other (See Comments)    Tingling in fingers  . Entresto [Sacubitril-Valsartan] Rash and Other (See Comments)    Tingling in fingers/ redness & sensitivity at tips of fingers    HOME MEDICATIONS: Outpatient Medications Prior to Visit  Medication Sig Dispense Refill  . acetaminophen (TYLENOL) 500 MG tablet Take 1,000 mg by mouth daily as needed for headache.    .[name]  atorvastatin (LIPITOR) 10 MG tablet 10 mg daily.    . carvedilol (COREG) 25 MG tablet Take 25 mg by mouth 2 (two) times daily with a meal.    . Cholecalciferol (VITAMIN D) 2000 units tablet Take 2,000 Units by mouth daily.    . diclofenac sodium (VOLTAREN) 1 % GEL Use as directed    . dofetilide (TIKOSYN) 500 MCG capsule Take 1 capsule (500 mcg total) by mouth every 12 (twelve) hours. 14 capsule 0  . eplerenone (INSPRA) 25 MG tablet Take 25 mg by mouth daily.    [name] olmesartan (BENICAR) 40 MG tablet Take 40 mg by mouth at bedtime.    [name] omeprazole (PRILOSEC) 20 MG capsule Take 20 mg by mouth daily.    . rivaroxaban (XARELTO) 20 MG TABS tablet Take 20 mg by mouth daily.     No facility-administered medications prior to visit.     PAST MEDICAL HISTORY: Past Medical History:  Diagnosis Date  . Atypical chest pain   . CHF (congestive heart failure) ([place])   . Dizziness   . GERD (gastroesophageal reflux disease)   . Gout   . HLD (hyperlipidemia)   . Hypertension   . IFG  (impaired fasting glucose)   . Obesity   . OSA on CPAP   . PVC (premature ventricular contraction)   . Shortness of breath dyspnea     PAST SURGICAL HISTORY: Past Surgical History:  Procedure Laterality Date  . CARDIAC CATHETERIZATION N/A 07/01/2015   Procedure: Left Heart Cath;  Surgeon: Adrian Prows, MD;  Location: [facility];  Service: Cardiovascular;  Laterality: N/A;  . CARPAL TUNNEL RELEASE    . ELECTROPHYSIOLOGIC STUDY N/A 02/05/2016   Procedure: Atrial Fibrillation Ablation;  Surgeon: Will Meredith Leeds, MD;  Location: [facility];  Service: Cardiovascular;  Laterality: N/A;  . NASAL SEPTOPLASTY W/ TURBINOPLASTY Bilateral 09/14/2016   Procedure: NASAL SEPTOPLASTY WITH BILATERAL  TURBINATE REDUCTION;  Surgeon: Leta Baptist, MD;  Location: [facility];  Service: ENT;  Laterality: Bilateral;    FAMILY HISTORY: Family History  Problem Relation Age of Onset  . Seizures Mother   . Deep vein thrombosis Mother   . COPD Mother   . Heart failure Mother   . Lung cancer Mother   . Prostate cancer Father   . Hypertension Father   . Diabetes Father   . Stroke Paternal Grandfather   . Heart attack Neg Hx     SOCIAL HISTORY: Social History   Socioeconomic History  . Marital status: Married    Spouse name: Lauro Regulus  . Number of children: 1  . Years of education: HS  . Highest education level: Not on file  Occupational History  . Occupation: [employer]  Social Needs  . Financial resource strain: Not on file  . Food insecurity:    Worry: Not on file    Inability: Not on file  . Transportation needs:    Medical: Not on file    Non-medical: Not on file  Tobacco Use  . Smoking status: Never Smoker  . Smokeless tobacco: Never Used  Substance and Sexual Activity  . Alcohol use: Yes    Alcohol/week: 1.0 standard drinks    Types: 1 Glasses of wine per week    Comment: Rare occasion  . Drug use: No  . Sexual activity: Yes  Lifestyle  . Physical activity:    Days  per week: Not on file    Minutes per session: Not on file  .  Stress: Not on file  Relationships  . Social connections:    Talks on phone: Not on file    Gets together: Not on file    Attends religious service: Not on file    Active member of club or organization: Not on file    Attends meetings of clubs or organizations: Not on file    Relationship status: Not on file  . Intimate partner violence:    Fear of current or ex partner: Not on file    Emotionally abused: Not on file    Physically abused: Not on file    Forced sexual activity: Not on file  Other Topics Concern  . Not on file  Social History Narrative   Drinks 1 Mt Dew 2 days a week      PHYSICAL EXAM  Vitals:   11/30/18 0835  BP: 125/79  Pulse: 66  Weight: 241 lb 6.4 oz (109.5 kg)  Height: '5\' 6"'$  (1.676 m)   Body mass index is 38.96 kg/m.  Generalized: Well developed, Obese [name] in no acute distress  Head: normocephalic and atraumatic,. Oropharynx benign  Neck: Supple, Musculoskeletal: No deformity   Neurological examination   Mentation: Alert oriented to time, place, history taking. Attention span and concentration appropriate. Recent and remote memory intact.  Follows all commands speech and language fluent.   Cranial nerve II-XII: Pupils were equal round reactive to light extraocular movements were full, visual field were full on confrontational test. Facial sensation and strength were normal. hearing was intact to finger rubbing bilaterally. Uvula tongue midline. head turning and shoulder shrug were normal and symmetric.Tongue protrusion into cheek strength was normal. Motor: normal bulk and tone, full strength in the BUE, BLE, fine finger movements normal, no pronator drift. No focal weakness Sensory: normal and symmetric to light touch,  Coordination: finger-nose-finger, heel-to-shin bilaterally, no dysmetria Gait and Station: Rising up from seated position without assistance, normal stance,  moderate  stride, good arm swing, smooth turning, able to perform tiptoe, and heel walking without difficulty. Tandem gait is steady  DIAGNOSTIC DATA (LABS, IMAGING, TESTING) - I reviewed patient records, labs, notes, testing and imaging myself where available.  Lab Results  Component Value Date   WBC 5.8 01/27/2016   HGB 13.8 01/27/2016   HCT 41.7 01/27/2016   MCV 79.0 01/27/2016   PLT 182 01/27/2016      Component Value Date/Time   NA 140 01/27/2016 0840   K 4.2 01/27/2016 0840   CL 104 01/27/2016 0840   CO2 28 01/27/2016 0840   GLUCOSE 91 01/27/2016 0840   BUN 13 01/27/2016 0840   CREATININE 0.93 01/27/2016 0840   CALCIUM 8.8 01/27/2016 0840   PROT 6.6 12/07/2015 1330   ALBUMIN 3.7 12/07/2015 1330   AST 19 12/07/2015 1330   ALT 28 12/07/2015 1330   ALKPHOS 47 12/07/2015 1330   BILITOT 0.8 12/07/2015 1330   GFRNONAA >60 12/10/2015 0234   GFRAA >60 12/10/2015 0234   Lab Results  Component Value Date   CHOL 166 12/08/2015   HDL 37 (L) 12/08/2015   LDLCALC 111 (H) 12/08/2015   TRIG 89 12/08/2015   CHOLHDL 4.5 12/08/2015    ASSESSMENT AND PLAN  52 y.o. year old [name]  has a past medical history of hyperlipidemia, gout, obesity, prediabetes, hypertension and nasal congestion, status post recent nasal turbinate reduction and septoplasty in October 2017, who presents for follow-up for severe obstructive sleep apnea .Data dated 10/30/2018-11/28/2018 shows compliance greater than 4 hours at 100%.  Average usage 6 hours 14 minutes.  Set pressure 17.6 cm AHI 1.6.    PLAN:Compliance data 100% greater than 4 hours Continue same settings Keep regular sleep wake schedule as much as possible( pt works 12 hour night shift) Follow-up yearly Portions of the above documentation or copy from previous visit for review purposes Dennie Bible, [facility], [facility], [facility] [address], [place], [place] [address] [phone]   I reviewed the above note and  documentation by the Nurse Practitioner and agree with the history, physical exam, assessment and plan as outlined above. I was immediately available for face-to-face consultation. Star Age, MD, PhD [facility])

## 2018-11-28 ENCOUNTER — Encounter: Payer: Self-pay | Admitting: Nurse Practitioner

## 2018-11-30 ENCOUNTER — Ambulatory Visit (INDEPENDENT_AMBULATORY_CARE_PROVIDER_SITE_OTHER): Payer: BLUE CROSS/BLUE SHIELD | Admitting: Nurse Practitioner

## 2018-11-30 ENCOUNTER — Encounter

## 2018-11-30 ENCOUNTER — Encounter: Payer: Self-pay | Admitting: Nurse Practitioner

## 2018-11-30 VITALS — BP 125/79 | HR 66 | Ht 66.0 in | Wt 241.4 lb

## 2018-11-30 DIAGNOSIS — G4733 Obstructive sleep apnea (adult) (pediatric): Secondary | ICD-10-CM | POA: Diagnosis not present

## 2018-11-30 NOTE — Patient Instructions (Signed)
Compliance data 100% greater than 4 hours Continue same settings Keep regular sleep wake schedule as much as possible( pt works 12 hour night shift) Follow-up yearly

## 2018-12-24 DIAGNOSIS — G4733 Obstructive sleep apnea (adult) (pediatric): Secondary | ICD-10-CM | POA: Diagnosis not present

## 2018-12-27 DIAGNOSIS — Z8679 Personal history of other diseases of the circulatory system: Secondary | ICD-10-CM | POA: Diagnosis not present

## 2018-12-27 DIAGNOSIS — I10 Essential (primary) hypertension: Secondary | ICD-10-CM | POA: Diagnosis not present

## 2018-12-27 DIAGNOSIS — I428 Other cardiomyopathies: Secondary | ICD-10-CM | POA: Diagnosis not present

## 2018-12-27 DIAGNOSIS — I77810 Thoracic aortic ectasia: Secondary | ICD-10-CM | POA: Diagnosis not present

## 2019-01-03 DIAGNOSIS — H04123 Dry eye syndrome of bilateral lacrimal glands: Secondary | ICD-10-CM | POA: Diagnosis not present

## 2019-01-18 ENCOUNTER — Other Ambulatory Visit: Payer: Self-pay | Admitting: Cardiology

## 2019-01-18 DIAGNOSIS — I77810 Thoracic aortic ectasia: Secondary | ICD-10-CM

## 2019-01-23 DIAGNOSIS — G4733 Obstructive sleep apnea (adult) (pediatric): Secondary | ICD-10-CM | POA: Diagnosis not present

## 2019-01-26 ENCOUNTER — Ambulatory Visit: Payer: BC Managed Care – PPO

## 2019-01-26 DIAGNOSIS — I77810 Thoracic aortic ectasia: Secondary | ICD-10-CM | POA: Diagnosis not present

## 2019-01-29 ENCOUNTER — Telehealth: Payer: Self-pay | Admitting: Cardiology

## 2019-01-29 ENCOUNTER — Other Ambulatory Visit: Payer: Self-pay | Admitting: Cardiology

## 2019-01-29 DIAGNOSIS — I428 Other cardiomyopathies: Secondary | ICD-10-CM

## 2019-01-29 DIAGNOSIS — I77810 Thoracic aortic ectasia: Secondary | ICD-10-CM

## 2019-01-29 NOTE — Telephone Encounter (Signed)
Let him know the echo shows mild decrease in heart function and stable or slightly improved from previous. Aortic root is moderately dilated and will need repeat evaluation in 6 months and orders placed.

## 2019-01-31 NOTE — Telephone Encounter (Signed)
Pt is scheduled for 08/01/2019 @ 10:00.

## 2019-02-22 DIAGNOSIS — G4733 Obstructive sleep apnea (adult) (pediatric): Secondary | ICD-10-CM | POA: Diagnosis not present

## 2019-03-02 ENCOUNTER — Other Ambulatory Visit: Payer: Self-pay

## 2019-03-02 DIAGNOSIS — G4733 Obstructive sleep apnea (adult) (pediatric): Secondary | ICD-10-CM

## 2019-03-02 MED ORDER — DOFETILIDE 500 MCG PO CAPS
500.0000 ug | ORAL_CAPSULE | Freq: Two times a day (BID) | ORAL | 0 refills | Status: DC
Start: 2019-03-02 — End: 2019-03-12

## 2019-03-12 ENCOUNTER — Other Ambulatory Visit: Payer: Self-pay

## 2019-03-12 DIAGNOSIS — G4733 Obstructive sleep apnea (adult) (pediatric): Secondary | ICD-10-CM

## 2019-03-12 MED ORDER — DOFETILIDE 500 MCG PO CAPS: 500.0000 ug | ORAL_CAPSULE | Freq: Two times a day (BID) | ORAL | 3 refills | Status: DC

## 2019-03-16 ENCOUNTER — Other Ambulatory Visit: Payer: Self-pay

## 2019-03-16 DIAGNOSIS — I480 Paroxysmal atrial fibrillation: Secondary | ICD-10-CM

## 2019-03-16 MED ORDER — EPLERENONE 25 MG PO TABS: 25.0000 mg | ORAL_TABLET | Freq: Every day | ORAL | 3 refills | Status: DC

## 2019-03-24 DIAGNOSIS — G4733 Obstructive sleep apnea (adult) (pediatric): Secondary | ICD-10-CM | POA: Diagnosis not present

## 2019-04-04 ENCOUNTER — Telehealth: Payer: Self-pay | Admitting: Cardiology

## 2019-04-04 NOTE — Telephone Encounter (Signed)
New message    Spoke with pt and scheduled doxemity video with pt. Pt smart phone number is listed in appt notes. Pt aware to have BP, HR and weight.      Virtual Visit Pre-Appointment Phone Call  "(Name), I am calling you today to discuss your upcoming appointment. We are currently trying to limit exposure to the virus that causes COVID-19 by seeing patients at home rather than in the office."  1. "What is the BEST phone number to call the day of the visit?" - include this in appointment notes  2. Do you have or have access to (through a family member/friend) a smartphone with video capability that we can use for your visit?" a. If yes - list this number in appt notes as cell (if different from BEST phone #) and list the appointment type as a VIDEO visit in appointment notes b. If no - list the appointment type as a PHONE visit in appointment notes  3. Confirm consent - "In the setting of the current Covid19 crisis, you are scheduled for a (phone or video) visit with your provider on (date) at (time).  Just as we do with many in-office visits, in order for you to participate in this visit, we must obtain consent.  If you'd like, I can send this to your mychart (if signed up) or email for you to review.  Otherwise, I can obtain your verbal consent now.  All virtual visits are billed to your insurance company just like a normal visit would be.  By agreeing to a virtual visit, we'd like you to understand that the technology does not allow for your provider to perform an examination, and thus may limit your provider's ability to fully assess your condition. If your provider identifies any concerns that need to be evaluated in person, we will make arrangements to do so.  Finally, though the technology is pretty good, we cannot assure that it will always work on either your or our end, and in the setting of a video visit, we may have to convert it to a phone-only visit.  In either situation, we  cannot ensure that we have a secure connection.  Are you willing to proceed?" STAFF: Did the patient verbally acknowledge consent to telehealth visit? Document YES/NO here: YES  4. Advise patient to be prepared - "Two hours prior to your appointment, go ahead and check your blood pressure, pulse, oxygen saturation, and your weight (if you have the equipment to check those) and write them all down. When your visit starts, your provider will ask you for this information. If you have an Apple Watch or Kardia device, please plan to have heart rate information ready on the day of your appointment. Please have a pen and paper handy nearby the day of the visit as well."  5. Give patient instructions for MyChart download to smartphone OR Doximity/Doxy.me as below if video visit (depending on what platform provider is using)  6. Inform patient they will receive a phone call 15 minutes prior to their appointment time (may be from unknown caller ID) so they should be prepared to answer    TELEPHONE CALL NOTE  Todd ParadiseWilliam R Myers Jr. has been deemed a candidate for a follow-up tele-health visit to limit community exposure during the Covid-19 pandemic. I spoke with the patient via phone to ensure availability of phone/video source, confirm preferred email & phone number, and discuss instructions and expectations.  I reminded Todd ParadiseWilliam R Jeziorski Jr. to  be prepared with any vital sign and/or heart rhythm information that could potentially be obtained via home monitoring, at the time of his visit. I reminded Todd [name]. to expect a phone call prior to his visit.  [name] 04/04/2019 1:44 PM   INSTRUCTIONS FOR DOWNLOADING THE MYCHART APP TO SMARTPHONE  - The patient must first make sure to have activated MyChart and know their login information - If Apple, go to [employer] and type in MyChart in the search bar and download the app. If Android, ask patient to go to [employer] and type in [place]  in the search bar and download the app. The app is free but as with any other app downloads, their phone may require them to verify saved payment information or Apple/Android password.  - The patient will need to then log into the app with their MyChart username and password, and select [facility] as their healthcare provider to link the account. When it is time for your visit, go to the MyChart app, find appointments, and click Begin Video Visit. Be sure to Select Allow for your device to access the Microphone and Camera for your visit. You will then be connected, and your provider will be with you shortly.  **If they have any issues connecting, or need assistance please contact MyChart service desk [phone])**  **If using a computer, in order to ensure the best quality for their visit they will need to use either of the following Internet Browsers: [employer] or Google Chrome**  IF USING DOXIMITY or DOXY.ME - The patient will receive a link just prior to their visit by text.     FULL LENGTH CONSENT FOR TELE-HEALTH VISIT   I hereby voluntarily request, consent and authorize [facility] and its employed or contracted physicians, physician assistants, nurse practitioners or other licensed health care professionals (the Practitioner), to provide me with telemedicine health care services (the Services") as deemed necessary by the treating Practitioner. I acknowledge and consent to receive the Services by the Practitioner via telemedicine. I understand that the telemedicine visit will involve communicating with the Practitioner through live audiovisual communication technology and the disclosure of certain medical information by electronic transmission. I acknowledge that I have been given the opportunity to request an in-person assessment or other available alternative prior to the telemedicine visit and am voluntarily participating in the telemedicine visit.  I understand  that I have the right to withhold or withdraw my consent to the use of telemedicine in the course of my care at any time, without affecting my right to future care or treatment, and that the Practitioner or I may terminate the telemedicine visit at any time. I understand that I have the right to inspect all information obtained and/or recorded in the course of the telemedicine visit and may receive copies of available information for a reasonable fee.  I understand that some of the potential risks of receiving the Services via telemedicine include:   Delay or interruption in medical evaluation due to technological equipment failure or disruption;  Information transmitted may not be sufficient (e.g. poor resolution of images) to allow for appropriate medical decision making by the Practitioner; and/or   In rare instances, security protocols could fail, causing a breach of personal health information.  Furthermore, I acknowledge that it is my responsibility to provide information about my medical history, conditions and care that is complete and accurate to the best of my ability. I acknowledge that  Practitioner's advice, recommendations, and/or decision may be based on factors not within their control, such as incomplete or inaccurate data provided by me or distortions of diagnostic images or specimens that may result from electronic transmissions. I understand that the practice of medicine is not an exact science and that Practitioner makes no warranties or guarantees regarding treatment outcomes. I acknowledge that I will receive a copy of this consent concurrently upon execution via email to the email address I last provided but may also request a printed copy by calling the office of [facility].    I understand that my insurance will be billed for this visit.   I have read or had this consent read to me.  I understand the contents of this consent, which adequately explains the benefits and risks  of the Services being provided via telemedicine.   I have been provided ample opportunity to ask questions regarding this consent and the Services and have had my questions answered to my satisfaction.  I give my informed consent for the services to be provided through the use of telemedicine in my medical care  By participating in this telemedicine visit I agree to the above.

## 2019-04-05 ENCOUNTER — Other Ambulatory Visit: Payer: Self-pay

## 2019-04-05 ENCOUNTER — Encounter: Payer: Self-pay | Admitting: Cardiology

## 2019-04-05 ENCOUNTER — Telehealth (INDEPENDENT_AMBULATORY_CARE_PROVIDER_SITE_OTHER): Payer: BLUE CROSS/BLUE SHIELD | Admitting: Cardiology

## 2019-04-05 DIAGNOSIS — I4819 Other persistent atrial fibrillation: Secondary | ICD-10-CM | POA: Diagnosis not present

## 2019-04-05 NOTE — Progress Notes (Signed)
Electrophysiology TeleHealth Note   Due to national recommendations of social distancing due to COVID 19, an audio/video telehealth visit is felt to be most appropriate for this patient at this time.  See Epic message for the patient's consent to telehealth for [facility].   Date:  04/05/2019   ID:  Todd Paradise., DOB [birth year], MRN [id number]  Location: patient's home  Provider location: [address], [place]  Evaluation Performed: Follow-up visit  PCP:  Martha Clan, MD  Cardiologist:  [name] Electrophysiologist:  Dr Elberta Fortis  Chief Complaint:  Atrial fibrillation  History of Present Illness:    Todd Ramnarain. is a 53 y.o. male who presents via audio/video conferencing for a telehealth visit today.  Since last being seen in our clinic, the patient reports doing very well.  Today, he denies symptoms of palpitations, chest pain, shortness of breath,  lower extremity edema, dizziness, presyncope, or syncope.  The patient is otherwise without complaint today.  The patient denies symptoms of fevers, chills, cough, or new SOB worrisome for COVID 19.  He has a history of persistent atrial fibrillation, hypertension, CHF with an EF improved post AF ablation, PVCs, and obstructive sleep apnea.  Today, denies symptoms of palpitations, chest pain, shortness of breath, orthopnea, PND, lower extremity edema, claudication, dizziness, presyncope, syncope, bleeding, or neurologic sequela. The patient is tolerating medications without difficulties.  Overall he is doing well.  He has no chest pain or shortness of breath.  He is unaware of atrial fibrillation.  He does feel that he has had PVCs at times, but his palpitations are only a few seconds.  Past Medical History:  Diagnosis Date  . Atypical chest pain   . CHF (congestive heart failure) (HCC)   . Dizziness   . GERD (gastroesophageal reflux disease)   . Gout   . HLD (hyperlipidemia)   . Hypertension   .  IFG (impaired fasting glucose)   . Obesity   . OSA on CPAP   . PVC (premature ventricular contraction)   . Shortness of breath dyspnea     Past Surgical History:  Procedure Laterality Date  . CARDIAC CATHETERIZATION N/A 07/01/2015   Procedure: Left Heart Cath;  Surgeon: Yates Decamp, MD;  Location: [facility] INVASIVE CV LAB;  Service: Cardiovascular;  Laterality: N/A;  . CARPAL TUNNEL RELEASE    . ELECTROPHYSIOLOGIC STUDY N/A 02/05/2016   Procedure: Atrial Fibrillation Ablation;  Surgeon: Arnika Larzelere [name], MD;  Location: [facility] INVASIVE CV LAB;  Service: Cardiovascular;  Laterality: N/A;  . NASAL SEPTOPLASTY W/ TURBINOPLASTY Bilateral 09/14/2016   Procedure: NASAL SEPTOPLASTY WITH BILATERAL  TURBINATE REDUCTION;  Surgeon: Newman Pies, MD;  Location: [facility];  Service: ENT;  Laterality: Bilateral;    Current Outpatient Medications  Medication Sig Dispense Refill  . acetaminophen (TYLENOL) 500 MG tablet Take 1,000 mg by mouth daily as needed for headache.    [name] atorvastatin (LIPITOR) 10 MG tablet 10 mg daily.    . carvedilol (COREG) 25 MG tablet Take 25 mg by mouth 2 (two) times daily with a meal.    . Cholecalciferol (VITAMIN D) 2000 units tablet Take 2,000 Units by mouth daily.    . diclofenac sodium (VOLTAREN) 1 % GEL Use as directed    . dofetilide (TIKOSYN) 500 MCG capsule Take 1 capsule (500 mcg total) by mouth every 12 (twelve) hours. 180 capsule 3  . eplerenone (INSPRA) 25 MG tablet Take 1 tablet (25 mg total) by  mouth daily. 90 tablet 3  . olmesartan (BENICAR) 40 MG tablet Take 40 mg by mouth at bedtime.    [name] omeprazole (PRILOSEC) 20 MG capsule Take 20 mg by mouth daily.    . rivaroxaban (XARELTO) 20 MG TABS tablet Take 20 mg by mouth daily.     No current facility-administered medications for this visit.     Allergies:   Metoprolol; Valsartan; Azithromycin; and Entresto [sacubitril-valsartan]   Social History:  The patient  reports that he has never smoked. He has never used  smokeless tobacco. He reports current alcohol use of about 1.0 standard drinks of alcohol per week. He reports that he does not use drugs.   Family History:  The patient's  family history includes COPD in his mother; Deep vein thrombosis in his mother; Diabetes in his father; Heart failure in his mother; Hypertension in his father; Lung cancer in his mother; Prostate cancer in his father; Seizures in his mother; Stroke in his paternal grandfather.   ROS:  Please see the history of present illness.   All other systems are personally reviewed and negative.    Exam:    Vital Signs:  BP 122/81   Pulse 70   Well appearing, alert and conversant, regular work of breathing,  good skin color Eyes- anicteric, neuro- grossly intact, skin- no apparent rash or lesions or cyanosis, mouth- oral mucosa is pink   Labs/Other Tests and Data Reviewed:    Recent Labs: No results found for requested labs within last 8760 hours.   Wt Readings from Last 3 Encounters:  11/30/18 241 lb 6.4 oz (109.5 kg)  03/22/18 241 lb (109.3 kg)  02/15/18 246 lb (111.6 kg)     Other studies personally reviewed: Additional studies/ records that were reviewed today include: TTE 01/29/2019  Review of the above records today demonstrates:   Left ventricle cavity is mild to moderately dilated. Moderate concentric hypertrophy of the left ventricle. Normal diastolic function. Mild decrease in LV systolic function without regional wall motion abnormality. Calculated EF 45%. Trileaflet aortic valve with trace regurgitation. Structurally normal mitral valve with trace regurgitation. Structurally normal tricuspid valve with trace regurgitation. The aortic root is moderately dilated at 4.5 cm. Compared to the study done on 07/20/2018, aortic root size has slightly increased from previous 4.2 cm.  ECG 03/22/2018 personally reviewed Sinus rhythm, PVCs   ASSESSMENT & PLAN:    1.  Persistent atrial fibrillation: Currently on Xarelto  and dofetilide.  Status post ablation 02/05/2016.  Has had minimal atrial fibrillation since then.  No changes.  This patients CHA2DS2-VASc Score and unadjusted Ischemic Stroke Rate (% per year) is equal to 2.2 % stroke rate/year from a score of 2  Above score calculated as 1 point each if present [CHF, HTN, DM, Vascular=MI/PAD/Aortic Plaque, Age if 65-74, or Male] Above score calculated as 2 points each if present [Age > 75, or Stroke/TIA/TE]  2.  Hypertension: Currently well controlled  3.  Obstructive sleep apnea: CPAP compliance encouraged  4.  PVCs: Likely to be outflow tract related.  Patient asymptomatic.  5.  Chronic systolic heart failure: Ejection fraction has improved since ablation.  Plan per primary cardiology.   COVID 19 screen The patient denies symptoms of COVID 19 at this time.  The importance of social distancing was discussed today.  Follow-up: 1 year   Current medicines are reviewed at length with the patient today.   The patient does not have concerns regarding his medicines.  The following changes  were made today:  none  Labs/ tests ordered today include:  No orders of the defined types were placed in this encounter.    Patient Risk:  after full review of this patients clinical status, I feel that they are at moderate risk at this time.  Today, I have spent 11 minutes with the patient with telehealth technology discussing atrial fibrillation, PVCs.    Signed, Tarita Deshmukh Jorja LoaMartin Balthazar Dooly, MD  04/05/2019 11:03 AM     [facility] [address] [place] [address] [phone] (office) [phone] (fax)

## 2019-04-12 DIAGNOSIS — R51 Headache: Secondary | ICD-10-CM | POA: Diagnosis not present

## 2019-04-12 DIAGNOSIS — I10 Essential (primary) hypertension: Secondary | ICD-10-CM | POA: Diagnosis not present

## 2019-04-24 DIAGNOSIS — G4733 Obstructive sleep apnea (adult) (pediatric): Secondary | ICD-10-CM | POA: Diagnosis not present

## 2019-05-24 DIAGNOSIS — G4733 Obstructive sleep apnea (adult) (pediatric): Secondary | ICD-10-CM | POA: Diagnosis not present

## 2019-06-07 DIAGNOSIS — H04123 Dry eye syndrome of bilateral lacrimal glands: Secondary | ICD-10-CM | POA: Diagnosis not present

## 2019-06-07 DIAGNOSIS — H52203 Unspecified astigmatism, bilateral: Secondary | ICD-10-CM | POA: Diagnosis not present

## 2019-06-07 DIAGNOSIS — H5213 Myopia, bilateral: Secondary | ICD-10-CM | POA: Diagnosis not present

## 2019-06-23 DIAGNOSIS — G4733 Obstructive sleep apnea (adult) (pediatric): Secondary | ICD-10-CM | POA: Diagnosis not present

## 2019-07-16 DIAGNOSIS — G4733 Obstructive sleep apnea (adult) (pediatric): Secondary | ICD-10-CM | POA: Diagnosis not present

## 2019-07-23 DIAGNOSIS — G4733 Obstructive sleep apnea (adult) (pediatric): Secondary | ICD-10-CM | POA: Diagnosis not present

## 2019-08-01 ENCOUNTER — Ambulatory Visit (INDEPENDENT_AMBULATORY_CARE_PROVIDER_SITE_OTHER): Payer: BLUE CROSS/BLUE SHIELD

## 2019-08-01 ENCOUNTER — Other Ambulatory Visit: Payer: Self-pay

## 2019-08-01 DIAGNOSIS — I77810 Thoracic aortic ectasia: Secondary | ICD-10-CM | POA: Diagnosis not present

## 2019-08-01 DIAGNOSIS — I428 Other cardiomyopathies: Secondary | ICD-10-CM

## 2019-08-08 ENCOUNTER — Ambulatory Visit: Payer: BLUE CROSS/BLUE SHIELD | Admitting: Cardiology

## 2019-08-16 ENCOUNTER — Encounter: Payer: Self-pay | Admitting: Cardiology

## 2019-08-16 ENCOUNTER — Other Ambulatory Visit: Payer: Self-pay

## 2019-08-16 ENCOUNTER — Ambulatory Visit (INDEPENDENT_AMBULATORY_CARE_PROVIDER_SITE_OTHER): Payer: BLUE CROSS/BLUE SHIELD | Admitting: Cardiology

## 2019-08-16 VITALS — BP 120/80 | HR 69 | Ht 66.0 in | Wt 232.0 lb

## 2019-08-16 DIAGNOSIS — I480 Paroxysmal atrial fibrillation: Secondary | ICD-10-CM | POA: Diagnosis not present

## 2019-08-16 DIAGNOSIS — G4733 Obstructive sleep apnea (adult) (pediatric): Secondary | ICD-10-CM

## 2019-08-16 DIAGNOSIS — Z9989 Dependence on other enabling machines and devices: Secondary | ICD-10-CM

## 2019-08-16 DIAGNOSIS — I10 Essential (primary) hypertension: Secondary | ICD-10-CM

## 2019-08-16 DIAGNOSIS — I428 Other cardiomyopathies: Secondary | ICD-10-CM | POA: Diagnosis not present

## 2019-08-16 NOTE — Progress Notes (Signed)
Primary Physician/Referring:  Marton Redwood, MD  Patient ID: Todd [name]., male    DOB: 06/29/1966, 53 y.o.   MRN: [id number]  Chief Complaint  Patient presents with  . Atrial Fibrillation   HPI:    Todd [name].  is a 53 y.o.  Caucasian male with nonischemic dilated cardiomyopathy (echocardiogram on 04/29/2015 revealing LVEF 30%, abnormal nuclear stress test on 05/12/2015 revealing inferior wall scar with mild ischemia and LVEF 19%); underwent angiography on 07/01/2015 revealing no significant coronary artery disease.  He has h/o PAF, underwent successful atrial fibrillation ablation on 02/05/2016. Since then he has noticed overall improvement in well-being. His last echo on 01/29/19, 08/01/19 -  EF 45%. He presents here for 6 m follow-up of PAF and cardiomyopathy. He has OSA and compliant with CPAP. No specific complaints today.   Past Medical History:  Diagnosis Date  . Atypical chest pain   . CHF (congestive heart failure) ([place])   . Dizziness   . GERD (gastroesophageal reflux disease)   . Gout   . HLD (hyperlipidemia)   . Hypertension   . IFG (impaired fasting glucose)   . Obesity   . OSA on CPAP   . PVC (premature ventricular contraction)   . Shortness of breath dyspnea    Past Surgical History:  Procedure Laterality Date  . CARDIAC CATHETERIZATION N/A 07/01/2015   Procedure: Left Heart Cath;  Surgeon: Adrian Prows, MD;  Location: [facility];  Service: Cardiovascular;  Laterality: N/A;  . CARPAL TUNNEL RELEASE    . ELECTROPHYSIOLOGIC STUDY N/A 02/05/2016   Procedure: Atrial Fibrillation Ablation;  Surgeon: Will Meredith Leeds, MD;  Location: [facility];  Service: Cardiovascular;  Laterality: N/A;  . NASAL SEPTOPLASTY W/ TURBINOPLASTY Bilateral 09/14/2016   Procedure: NASAL SEPTOPLASTY WITH BILATERAL  TURBINATE REDUCTION;  Surgeon: Leta Baptist, MD;  Location: [facility];  Service: ENT;  Laterality: Bilateral;   Social History   Socioeconomic  History  . Marital status: Married    Spouse name: Lauro Regulus  . Number of children: 1  . Years of education: HS  . Highest education level: Not on file  Occupational History  . Occupation: [employer]  Social Needs  . Financial resource strain: Not on file  . Food insecurity    Worry: Not on file    Inability: Not on file  . Transportation needs    Medical: Not on file    Non-medical: Not on file  Tobacco Use  . Smoking status: Never Smoker  . Smokeless tobacco: Never Used  Substance and Sexual Activity  . Alcohol use: Yes    Alcohol/week: 1.0 standard drinks    Types: 1 Glasses of wine per week    Comment: Rare occasion  . Drug use: No  . Sexual activity: Yes  Lifestyle  . Physical activity    Days per week: Not on file    Minutes per session: Not on file  . Stress: Not on file  Relationships  . Social Herbalist on phone: Not on file    Gets together: Not on file    Attends religious service: Not on file    Active member of club or organization: Not on file    Attends meetings of clubs or organizations: Not on file    Relationship status: Not on file  . Intimate partner violence    Fear of current or ex partner: Not on file    Emotionally abused: Not  on file    Physically abused: Not on file    Forced sexual activity: Not on file  Other Topics Concern  . Not on file  Social History Narrative   Drinks 1 Mt Dew 2 days a week    ROS  Review of Systems  Constitution: Negative for chills, decreased appetite, malaise/fatigue and weight gain.  Cardiovascular: Negative for dyspnea on exertion, leg swelling and syncope.  Endocrine: Negative for cold intolerance.  Hematologic/Lymphatic: Does not bruise/bleed easily.  Musculoskeletal: Negative for joint swelling.  Gastrointestinal: Negative for abdominal pain, anorexia, change in bowel habit, hematochezia and melena.  Neurological: Negative for headaches and light-headedness.  Psychiatric/Behavioral: Negative for  depression and substance abuse.  All other systems reviewed and are negative.  Objective   Vitals with BMI 08/16/2019 04/05/2019 11/30/2018  Height 5' 6"  - 5' 6"   Weight 232 lbs - 241 lbs 6 oz  BMI 67.67 - 20.94  Systolic 709 628 366  Diastolic 80 81 79  Pulse 69 70 66    Blood pressure 120/80, pulse 69, height 5' 6"  (1.676 m), weight 232 lb (105.2 kg), SpO2 95 %. Body mass index is 37.45 kg/m.   Physical Exam  Constitutional:  Moderately built and moderately obese in no acute distress.  HENT:  Head: Atraumatic.  Eyes: Conjunctivae are normal.  Neck: Neck supple. No JVD present. No thyromegaly present.  Cardiovascular: Normal rate, regular rhythm, normal heart sounds and intact distal pulses. Exam reveals no gallop.  No murmur heard. Pulmonary/Chest: Effort normal and breath sounds normal.  Abdominal: Soft. Bowel sounds are normal.  Musculoskeletal: Normal range of motion.  Neurological: He is alert.  Skin: Skin is warm and dry.  Psychiatric: He has a normal mood and affect.   Radiology: No results found.  Laboratory examination:   10/24/2018: Hemoglobin A1c 5.7%.  Creatinine 0.9, EGFR 89/107, potassium 4.0, CMP normal.  CBC normal.  Cholesterol 150, triglycerides 71, HDL 50, LDL 86.  TSH normal.  06/16/2018: CBC normal.  Creatinine 1.04, EGFR 83/96, potassium 4.7, BMP normal.  Magnesium 2.1.  No results for input(s): NA, K, CL, CO2, GLUCOSE, BUN, CREATININE, CALCIUM, GFRNONAA, GFRAA in the last 8760 hours. CMP Latest Ref Rng & Units 01/27/2016 12/10/2015 12/09/2015  Glucose 65 - 99 mg/dL 91 95 93  BUN 7 - 25 mg/dL 13 12 13   Creatinine 0.60 - 1.35 mg/dL 0.93 1.02 1.13  Sodium 135 - 146 mmol/L 140 140 138  Potassium 3.5 - 5.3 mmol/L 4.2 3.6 3.9  Chloride 98 - 110 mmol/L 104 104 103  CO2 20 - 31 mmol/L 28 29 27   Calcium 8.6 - 10.3 mg/dL 8.8 8.8(L) 8.8(L)  Total Protein 6.5 - 8.1 g/dL - - -  Total Bilirubin 0.3 - 1.2 mg/dL - - -  Alkaline Phos 38 - 126 U/L - - -  AST 15 - 41  U/L - - -  ALT 17 - 63 U/L - - -   CBC Latest Ref Rng & Units 01/27/2016 12/10/2015 07/04/2015  WBC 4.0 - 10.5 K/uL 5.8 8.0 6.9  Hemoglobin 13.0 - 17.0 g/dL 13.8 14.4 14.5  Hematocrit 39.0 - 52.0 % 41.7 43.4 43.9  Platelets 150 - 400 K/uL 182 176 204   Lipid Panel     Component Value Date/Time   CHOL 166 12/08/2015 0352   TRIG 89 12/08/2015 0352   HDL 37 (L) 12/08/2015 0352   CHOLHDL 4.5 12/08/2015 0352   VLDL 18 12/08/2015 0352   LDLCALC 111 (H) 12/08/2015 2947  HEMOGLOBIN A1C No results found for: HGBA1C, MPG TSH No results for input(s): TSH in the last 8760 hours. Medications   Prior to Admission medications   Medication Sig Start Date End Date Taking? Authorizing Provider  acetaminophen (TYLENOL) 500 MG tablet Take 1,000 mg by mouth daily as needed for headache.   Yes [provider]  atorvastatin (LIPITOR) 10 MG tablet 10 mg daily. 07/14/17  Yes [provider]  carvedilol (COREG) 25 MG tablet Take 25 mg by mouth 2 (two) times daily with a meal.   Yes [provider]  Cholecalciferol (VITAMIN D) 2000 units tablet Take 2,000 Units by mouth daily.   Yes [provider]  diclofenac sodium (VOLTAREN) 1 % GEL Apply 2 g topically as needed. Use as directed 03/15/18  Yes [provider]  dofetilide (TIKOSYN) 500 MCG capsule Take 1 capsule (500 mcg total) by mouth every 12 (twelve) hours. 03/12/19  Yes Adrian Prows, MD  eplerenone (INSPRA) 25 MG tablet Take 1 tablet (25 mg total) by mouth daily. 03/16/19  Yes Miquel Dunn, NP  olmesartan (BENICAR) 40 MG tablet Take 40 mg by mouth at bedtime.   Yes [provider]  omeprazole (PRILOSEC) 20 MG capsule Take 20 mg by mouth as needed.    Yes [provider]  rivaroxaban (XARELTO) 20 MG TABS tablet Take 20 mg by mouth daily. 01/21/17  Yes [provider]     Current Outpatient Medications  Medication Instructions  . acetaminophen (TYLENOL) 1,000 mg, Oral, Daily PRN   . atorvastatin (LIPITOR) 10 mg, Daily  . carvedilol (COREG) 25 mg, Oral, 2 times daily with meals  . diclofenac sodium (VOLTAREN) 2 g, Topical, As needed, Use as directed  . dofetilide (TIKOSYN) 500 mcg, Oral, Every 12 hours  . eplerenone (INSPRA) 25 mg, Oral, Daily  . olmesartan (BENICAR) 40 mg, Oral, Daily at bedtime  . omeprazole (PRILOSEC) 20 mg, Oral, As needed  . rivaroxaban (XARELTO) 20 mg, Oral, Daily  . Vitamin D 2,000 Units, Oral, Daily   Cardiac Studies:   Coronary angiogram 07/01/2015: Normal coronary arteries. LVEF 25-35%.   Sleep study 07/17/2015: Successful CPAP titration for OSA. Follows Dr. Rexene Alberts.  AF Ablation 02/05/2016: Successful electrical isolation and anatomical encircling of all four pulmonary veins with radiofrequency current (Dr. Allegra Lai)  Echocardiogram 08/01/2019: Left ventricle cavity is normal in size. Moderate concentric hypertrophy of the left ventricle. Mild global hypokinesis with mildly depressed LV systolic function with EF 45%. Doppler evidence of grade I (impaired) diastolic dysfunction, normal LAP.  Mild (Grade I) mitral regurgitation. Inadequate TR jet to estimate pulmonary artery systolic pressure. Normal right atrial pressure. Mild dilatation of proximal ascending aorta 4.1 cm, which is slightly less than measurement seen on previous study dated 01/26/2019.   Assessment     ICD-10-CM   1. Paroxysmal atrial fibrillation (HCC)  I48.0 EKG 12-Lead    CHA2DS2-VASc Score is 2.  Yearly risk of stroke: 2.2%. (HTN and CHF)  2. Nonischemic cardiomyopathy (HCC)  I42.8   3. Essential hypertension  I10   4. OSA on CPAP  G47.33    Z99.89    08/16/2019: Sinus rhythm with first-degree AV block at rate of 70 bpm, leftward axis.  Incomplete right bundle-branch.  No evidence of ischemia.  Baseline artifact but EKG is interpretable.   Recommendations:   Mr. Passero presents here for 6 month follow-up of atrial fibrillation S/P ablation, continues to  maintain sinus rhythm and feels well and essentially asymptomatic with no  clinical evidence of congestive heart failure.  Blood pressure is also very well controlled.  Lipids now being managed by his PCP.  I'm beginning to wonder whether we should discontinue Xarelto as his cardioembolic risk is 2.1% as his ejection fraction is improved from 30% in 2016 and with rate control and conversion to sinus rhythm since 2017 ejection fraction is persistently been greater than 40% including the latest echocardiogram on 08/16/2019.  He is also been compliant with CPAP, blood pressure is also well controlled.  I will forward my note to Dr. Curt Bears and ask his opinion regarding this.    Otherwise I did not make any changes to his medications.  I do not have his recent labs, as patient is on Tikosyn he needs by annual CMP, magnesium levels as well.  I offered this to the patient, he states that he has already been scheduled for a complete physical examination with Dr. Brigitte Pulse and will do this at that time.  Patient is aware of close monitoring of renal function and electrolytes.  Adrian Prows, MD, [facility] 08/16/2019, 4:42 PM [facility] [place] Pager: [phone] Office: [phone] If no answer Cell [phone]

## 2019-11-01 DIAGNOSIS — M109 Gout, unspecified: Secondary | ICD-10-CM | POA: Diagnosis not present

## 2019-11-01 DIAGNOSIS — R7301 Impaired fasting glucose: Secondary | ICD-10-CM | POA: Diagnosis not present

## 2019-11-01 DIAGNOSIS — E559 Vitamin D deficiency, unspecified: Secondary | ICD-10-CM | POA: Diagnosis not present

## 2019-11-01 DIAGNOSIS — Z125 Encounter for screening for malignant neoplasm of prostate: Secondary | ICD-10-CM | POA: Diagnosis not present

## 2019-11-01 DIAGNOSIS — E7849 Other hyperlipidemia: Secondary | ICD-10-CM | POA: Diagnosis not present

## 2019-11-02 DIAGNOSIS — I10 Essential (primary) hypertension: Secondary | ICD-10-CM | POA: Diagnosis not present

## 2019-11-02 DIAGNOSIS — R82998 Other abnormal findings in urine: Secondary | ICD-10-CM | POA: Diagnosis not present

## 2019-11-02 DIAGNOSIS — Z23 Encounter for immunization: Secondary | ICD-10-CM | POA: Diagnosis not present

## 2019-11-08 DIAGNOSIS — Z0000 Encounter for general adult medical examination without abnormal findings: Secondary | ICD-10-CM | POA: Diagnosis not present

## 2019-11-08 DIAGNOSIS — E785 Hyperlipidemia, unspecified: Secondary | ICD-10-CM | POA: Diagnosis not present

## 2019-11-08 DIAGNOSIS — G4733 Obstructive sleep apnea (adult) (pediatric): Secondary | ICD-10-CM | POA: Diagnosis not present

## 2019-11-08 DIAGNOSIS — R7301 Impaired fasting glucose: Secondary | ICD-10-CM | POA: Diagnosis not present

## 2019-11-08 DIAGNOSIS — Z1331 Encounter for screening for depression: Secondary | ICD-10-CM | POA: Diagnosis not present

## 2019-11-08 DIAGNOSIS — I10 Essential (primary) hypertension: Secondary | ICD-10-CM | POA: Diagnosis not present

## 2019-11-13 DIAGNOSIS — Z1212 Encounter for screening for malignant neoplasm of rectum: Secondary | ICD-10-CM | POA: Diagnosis not present

## 2019-11-20 DIAGNOSIS — H5712 Ocular pain, left eye: Secondary | ICD-10-CM | POA: Diagnosis not present

## 2019-12-03 ENCOUNTER — Telehealth: Payer: Self-pay | Admitting: Adult Health

## 2019-12-03 NOTE — Telephone Encounter (Signed)
I called patient and LVM to r/s 1/6 d/t MM out. Requested patient call back to r/s or do MyChart visit.

## 2019-12-04 ENCOUNTER — Telehealth: Payer: Self-pay

## 2019-12-04 NOTE — Telephone Encounter (Signed)
Called pt to inform him Aundra Millet would not be into work this week or the 1/2 of next week. NALVM.  Canceled his a appt.  If pt calls back please r/s his appt with NP Herbert Pun, or NP Amy Lomax.  If neither Amy nor Aundra Millet are availble please schedule with NP Shanda Bumps.

## 2019-12-05 ENCOUNTER — Telehealth: Payer: Self-pay | Admitting: Adult Health

## 2020-01-09 DIAGNOSIS — G4733 Obstructive sleep apnea (adult) (pediatric): Secondary | ICD-10-CM | POA: Diagnosis not present

## 2020-02-12 DIAGNOSIS — G4733 Obstructive sleep apnea (adult) (pediatric): Secondary | ICD-10-CM | POA: Diagnosis not present

## 2020-02-18 ENCOUNTER — Encounter: Payer: Self-pay | Admitting: Adult Health

## 2020-02-20 ENCOUNTER — Ambulatory Visit (INDEPENDENT_AMBULATORY_CARE_PROVIDER_SITE_OTHER): Payer: BC Managed Care – PPO | Admitting: Adult Health

## 2020-02-20 ENCOUNTER — Encounter: Payer: Self-pay | Admitting: Adult Health

## 2020-02-20 ENCOUNTER — Other Ambulatory Visit: Payer: Self-pay

## 2020-02-20 VITALS — BP 107/72 | HR 62 | Temp 97.0°F | Ht 66.0 in | Wt 231.0 lb

## 2020-02-20 DIAGNOSIS — G4733 Obstructive sleep apnea (adult) (pediatric): Secondary | ICD-10-CM

## 2020-02-20 NOTE — Patient Instructions (Signed)
Continue using CPAP nightly and greater than 4 hours each night °If your symptoms worsen or you develop new symptoms please let us know.  ° °

## 2020-02-20 NOTE — Progress Notes (Addendum)
PATIENT: Todd [name]. DOB: August 03, 1966  REASON FOR VISIT: follow up HISTORY FROM: patient  HISTORY OF PRESENT ILLNESS: Today 02/20/20:  Todd [name] is a 54 year old male with a history of obstructive sleep apnea on CPAP.  His download indicates that he use his machine nightly for compliance of 100%.  He uses machine greater than 4 hours 29 out of 30 days for compliance of 97%.  On average he uses his machine 6 hours and 26 minutes.  His residual AHI is 0.9 on 16.4 cm of water with EPR of 2.  Leak in the 95th percentile is 14.6 L/min.  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  HISTORY 1/2/2020CM Todd [name] 54 year old male returns for follow-up with history of obstructive sleep apnea here for CPAP compliance.  He reports he has not been sleeping well the last couple of weeks.  His wife fell and suffered a subdural hematoma.  He has been anxious over this.  He continues to work 12-hour night shifts.  Compliance data dated 10/30/2018-11/28/2018 shows compliance greater than 4 hours at 100%.  Average usage 6 hours 14 minutes.  Set pressure 17.6 cm AHI 1.6.  He returns for reevaluation  REVIEW OF SYSTEMS: Out of a complete 14 system review of symptoms, the patient complains only of the following symptoms, and all other reviewed systems are negative.  FSS 24 ESS 7  ALLERGIES: Allergies  Allergen Reactions  . Metoprolol Other (See Comments)    Lethargy, didn't feel like doing anything (tolerates low dose carvedilol)  . Valsartan Other (See Comments)    Increased blood pressure  . Azithromycin Itching and Other (See Comments)    Tingling in fingers  . Entresto [Sacubitril-Valsartan] Rash and Other (See Comments)    Tingling in fingers/ redness & sensitivity at tips of fingers    HOME MEDICATIONS: Outpatient Medications Prior to Visit  Medication Sig Dispense Refill  . acetaminophen (TYLENOL) 500 MG tablet Take 1,000 mg by mouth daily as needed for  headache.    [name] atorvastatin (LIPITOR) 10 MG tablet 10 mg daily.    . carvedilol (COREG) 25 MG tablet Take 25 mg by mouth 2 (two) times daily with a meal.    . Cholecalciferol (VITAMIN D) 2000 units tablet Take 2,000 Units by mouth daily.    . diclofenac sodium (VOLTAREN) 1 % GEL Apply 2 g topically as needed. Use as directed    . dofetilide (TIKOSYN) 500 MCG capsule Take 1 capsule (500 mcg total) by mouth every 12 (twelve) hours. 180 capsule 3  . eplerenone (INSPRA) 25 MG tablet Take 1 tablet (25 mg total) by mouth daily. 90 tablet 3  . olmesartan (BENICAR) 40 MG tablet Take 40 mg by mouth at bedtime.    [name] omeprazole (PRILOSEC) 20 MG capsule Take 20 mg by mouth as needed.     . rivaroxaban (XARELTO) 20 MG TABS tablet Take 20 mg by mouth daily.     No facility-administered medications prior to visit.    PAST MEDICAL HISTORY: Past Medical History:  Diagnosis Date  . Atypical chest pain   . CHF (congestive heart failure) ([facility])   . Dizziness   . GERD (gastroesophageal reflux disease)   . Gout   . HLD (hyperlipidemia)   . Hypertension   . IFG (impaired fasting glucose)   . Obesity   . OSA on CPAP   . PVC (premature ventricular contraction)   . Shortness of breath dyspnea  PAST SURGICAL HISTORY: Past Surgical History:  Procedure Laterality Date  . CARDIAC CATHETERIZATION N/A 07/01/2015   Procedure: Left Heart Cath;  Surgeon: Yates Decamp, MD;  Location: [facility];  Service: Cardiovascular;  Laterality: N/A;  . CARPAL TUNNEL RELEASE    . ELECTROPHYSIOLOGIC STUDY N/A 02/05/2016   Procedure: Atrial Fibrillation Ablation;  Surgeon: Will [name], MD;  Location: [facility];  Service: Cardiovascular;  Laterality: N/A;  . NASAL SEPTOPLASTY W/ TURBINOPLASTY Bilateral 09/14/2016   Procedure: NASAL SEPTOPLASTY WITH BILATERAL  TURBINATE REDUCTION;  Surgeon: Newman Pies, MD;  Location: [facility];  Service: ENT;  Laterality: Bilateral;    FAMILY HISTORY: Family  History  Problem Relation Age of Onset  . Seizures Mother   . Deep vein thrombosis Mother   . COPD Mother   . Heart failure Mother   . Lung cancer Mother   . Prostate cancer Father   . Hypertension Father   . Diabetes Father   . Stroke Paternal Grandfather   . Heart attack Neg Hx     SOCIAL HISTORY: Social History   Socioeconomic History  . Marital status: Married    Spouse name: Dorisann Frames  . Number of children: 1  . Years of education: HS  . Highest education level: Not on file  Occupational History  . Occupation: [employer]  Tobacco Use  . Smoking status: Never Smoker  . Smokeless tobacco: Never Used  Substance and Sexual Activity  . Alcohol use: Yes    Alcohol/week: 1.0 standard drinks    Types: 1 Glasses of wine per week    Comment: Rare occasion  . Drug use: No  . Sexual activity: Yes  Other Topics Concern  . Not on file  Social History Narrative   Drinks 1 Mt Dew 2 days a week    Social Determinants of Corporate investment banker Strain:   . Difficulty of Paying Living Expenses:   Food Insecurity:   . Worried About Programme researcher, broadcasting/film/video in the Last Year:   . Barista in the Last Year:   Transportation Needs:   . Freight forwarder (Medical):   [name] Lack of Transportation (Non-Medical):   Physical Activity:   . Days of Exercise per Week:   . Minutes of Exercise per Session:   Stress:   . Feeling of Stress :   Social Connections:   . Frequency of Communication with Friends and Family:   . Frequency of Social Gatherings with Friends and Family:   . Attends Religious Services:   . Active Member of Clubs or Organizations:   . Attends Banker Meetings:   [name] Marital Status:   Intimate Partner Violence:   . Fear of Current or Ex-Partner:   . Emotionally Abused:   [name] Physically Abused:   . Sexually Abused:       PHYSICAL EXAM  Vitals:   02/20/20 1017  BP: 107/72  Pulse: 62  Temp: (!) 97 F (36.1 C)  Weight: 231 lb (104.8 kg)  Height:  5\' 6"  (1.676 m)   Body mass index is 37.28 kg/m.  Generalized: Well developed, in no acute distress  Chest: Lungs clear to auscultation bilaterally  Neurological examination  Mentation: Alert oriented to time, place, history taking. Follows all commands speech and language fluent Cranial nerve II-XII: Extraocular movements were full, visual field were full on confrontational test Head turning and shoulder shrug  were normal and symmetric. Motor: The motor testing  reveals 5 over 5 strength of all 4 extremities. Good symmetric motor tone is noted throughout.  Sensory: Sensory testing is intact to soft touch on all 4 extremities. No evidence of extinction is noted.  Gait and station: Gait is normal.    DIAGNOSTIC DATA (LABS, IMAGING, TESTING) - I reviewed patient records, labs, notes, testing and imaging myself where available.  Lab Results  Component Value Date   WBC 5.8 01/27/2016   HGB 13.8 01/27/2016   HCT 41.7 01/27/2016   MCV 79.0 01/27/2016   PLT 182 01/27/2016      Component Value Date/Time   NA 140 01/27/2016 0840   K 4.2 01/27/2016 0840   CL 104 01/27/2016 0840   CO2 28 01/27/2016 0840   GLUCOSE 91 01/27/2016 0840   BUN 13 01/27/2016 0840   CREATININE 0.93 01/27/2016 0840   CALCIUM 8.8 01/27/2016 0840   PROT 6.6 12/07/2015 1330   ALBUMIN 3.7 12/07/2015 1330   AST 19 12/07/2015 1330   ALT 28 12/07/2015 1330   ALKPHOS 47 12/07/2015 1330   BILITOT 0.8 12/07/2015 1330   GFRNONAA >60 12/10/2015 0234   GFRAA >60 12/10/2015 0234   Lab Results  Component Value Date   CHOL 166 12/08/2015   HDL 37 (L) 12/08/2015   LDLCALC 111 (H) 12/08/2015   TRIG 89 12/08/2015   CHOLHDL 4.5 12/08/2015      ASSESSMENT AND PLAN 54 y.o. year old male  has a past medical history of Atypical chest pain, CHF (congestive heart failure) ([place]), Dizziness, GERD (gastroesophageal reflux disease), Gout, HLD (hyperlipidemia), Hypertension, IFG (impaired fasting glucose), Obesity, OSA on  CPAP, PVC (premature ventricular contraction), and Shortness of breath dyspnea. here with:  1. OSA on CPAP  - CPAP compliance excellent - Good treatment of AHI  - Encourage patient to use CPAP nightly and > 4 hours each night - F/U in 1 year or sooner if needed   I spent 20 minutes of face-to-face and non-face-to-face time with patient.  This included previsit chart review, lab review, study review, order entry, electronic health record documentation, patient education.  Ward Givens, MSN, NP-C 02/20/2020, 10:38 AM [facility] [address] [place], [place] [address] [phone]  I reviewed the above note and documentation by the Nurse Practitioner and agree with the history, exam, assessment and plan as outlined above. I was available for consultation. Star Age, MD, PhD [facility])

## 2020-03-21 ENCOUNTER — Other Ambulatory Visit: Payer: Self-pay

## 2020-03-21 DIAGNOSIS — I480 Paroxysmal atrial fibrillation: Secondary | ICD-10-CM

## 2020-03-21 MED ORDER — EPLERENONE 25 MG PO TABS: 25.0000 mg | ORAL_TABLET | Freq: Every day | ORAL | 3 refills | Status: DC

## 2020-04-07 ENCOUNTER — Other Ambulatory Visit: Payer: Self-pay | Admitting: Cardiology

## 2020-04-07 DIAGNOSIS — G4733 Obstructive sleep apnea (adult) (pediatric): Secondary | ICD-10-CM

## 2020-04-12 NOTE — Progress Notes (Signed)
Primary Physician/Referring:  Marton Redwood, MD  Patient ID: Todd Pilgrim., male    DOB: Aug 29, 1966, 54 y.o.   MRN: [id number]  Chief Complaint  Patient presents with  . Atrial Fibrillation  . Follow-up    8 month   HPI:    Todd Depuy.  is a Caucasian 54 y.o. male with nonischemic dilated cardiomyopathy (echocardiogram on 04/29/2015 revealing LVEF 30%, abnormal nuclear stress test on 05/12/2015 revealing inferior wall scar with mild ischemia and LVEF 19%); coronary angiogram on 07/01/2015 revealing no significant coronary artery disease.    He has h/o PAF, underwent successful atrial fibrillation ablation on 02/05/2016. No recurrence and is on CPAP and compliant. His last echo on 01/29/19, 08/01/19 -  EF 45%. No specific complaints today.   Past Medical History:  Diagnosis Date  . Atypical chest pain   . CHF (congestive heart failure) ([place])   . Dizziness   . GERD (gastroesophageal reflux disease)   . Gout   . HLD (hyperlipidemia)   . Hypertension   . IFG (impaired fasting glucose)   . Obesity   . OSA on CPAP   . PVC (premature ventricular contraction)   . Shortness of breath dyspnea    Past Surgical History:  Procedure Laterality Date  . CARDIAC CATHETERIZATION N/A 07/01/2015   Procedure: Left Heart Cath;  Surgeon: Adrian Prows, MD;  Location: [facility];  Service: Cardiovascular;  Laterality: N/A;  . CARPAL TUNNEL RELEASE    . ELECTROPHYSIOLOGIC STUDY N/A 02/05/2016   Procedure: Atrial Fibrillation Ablation;  Surgeon: Will Meredith Leeds, MD;  Location: [facility];  Service: Cardiovascular;  Laterality: N/A;  . NASAL SEPTOPLASTY W/ TURBINOPLASTY Bilateral 09/14/2016   Procedure: NASAL SEPTOPLASTY WITH BILATERAL  TURBINATE REDUCTION;  Surgeon: Leta Baptist, MD;  Location: [facility];  Service: ENT;  Laterality: Bilateral;   Family History  Problem Relation Age of Onset  . Seizures Mother   . Deep vein thrombosis Mother   . COPD Mother   .  Heart failure Mother   . Lung cancer Mother   . Prostate cancer Father   . Hypertension Father   . Diabetes Father   . Stroke Paternal Grandfather   . Heart attack Neg Hx     Social History   Tobacco Use  . Smoking status: Never Smoker  . Smokeless tobacco: Never Used  Substance Use Topics  . Alcohol use: Yes    Alcohol/week: 1.0 standard drinks    Types: 1 Glasses of wine per week    Comment: Rare occasion   Marital Status: Married  ROS  Review of Systems  Cardiovascular: Negative for dyspnea on exertion, leg swelling and syncope.  Respiratory: Negative for shortness of breath.   Gastrointestinal: Negative for melena.   Objective  Blood pressure 107/75, pulse 64, temperature (!) 97.2 F (36.2 C), temperature source Temporal, resp. rate 16, height _0  (1.676 m), weight 227 lb 6.4 oz (103.1 kg), SpO2 98 %.  Vitals with BMI 04/16/2020 02/20/2020 08/16/2019  Height _1  _2  _3   Weight 227 lbs 6 oz 231 lbs 232 lbs  BMI 36.72 02.7 74.12  Systolic 878 676 720  Diastolic 75 72 80  Pulse 64 62 69     Physical Exam  Constitutional: He appears well-developed and well-nourished. No distress.  Moderately built and moderately obese in no acute distress.   Cardiovascular: Normal rate, regular rhythm and intact distal pulses. Exam reveals no gallop.  No murmur heard. No leg edema, no JVD.   Pulmonary/Chest: Effort normal and breath sounds normal. No accessory muscle usage.  Abdominal: Soft. Bowel sounds are normal.   Laboratory examination:   No results for input(s): NA, K, CL, CO2, GLUCOSE, BUN, CREATININE, CALCIUM, GFRNONAA, GFRAA in the last 8760 hours. CrCl cannot be calculated (Patient's most recent lab result is older than the maximum 21 days allowed.).  CMP Latest Ref Rng & Units 01/27/2016 12/10/2015 12/09/2015  Glucose 65 - 99 mg/dL 91 95 93  BUN 7 - 25 mg/dL _0 Creatinine 0.60 - 1.35 mg/dL 0.93 1.02 1.13  Sodium 135 - 146 mmol/L 140 140 138  Potassium 3.5 -  5.3 mmol/L 4.2 3.6 3.9  Chloride 98 - 110 mmol/L 104 104 103  CO2 20 - 31 mmol/L _1 Calcium 8.6 - 10.3 mg/dL 8.8 8.8(L) 8.8(L)  Total Protein 6.5 - 8.1 g/dL - - -  Total Bilirubin 0.3 - 1.2 mg/dL - - -  Alkaline Phos 38 - 126 U/L - - -  AST 15 - 41 U/L - - -  ALT 17 - 63 U/L - - -   CBC Latest Ref Rng & Units 01/27/2016 12/10/2015 07/04/2015  WBC 4.0 - 10.5 K/uL 5.8 8.0 6.9  Hemoglobin 13.0 - 17.0 g/dL 13.8 14.4 14.5  Hematocrit 39.0 - 52.0 % 41.7 43.4 43.9  Platelets 150 - 400 K/uL 182 176 204   Lipid Panel     Component Value Date/Time   CHOL 166 12/08/2015 0352   TRIG 89 12/08/2015 0352   HDL 37 (L) 12/08/2015 0352   CHOLHDL 4.5 12/08/2015 0352   VLDL 18 12/08/2015 0352   LDLCALC 111 (H) 12/08/2015 0352   HEMOGLOBIN A1C No results found for: HGBA1C, MPG TSH No results for input(s): TSH in the last 8760 hours.   External labs:   Lipid Panel completed 11/01/2019 Cholesterol, total 179.000 m 11/01/2019 Triglycerides 105.000 11/01/2019 HDL 39 MG/DL 11/01/2019 LDL 119.000 m 11/01/2019  A1C 5.500 % 11/01/2019 TSH 1.520 micr 11/01/2019 FOBT: Normal 11/13/2019  Glucose Random N 11/02/2019 MicroAlbumin Urine 8.000 11/02/2019 MicroAlbumin/Creat 4.6 MG/DL 11/02/2019  BUN 14.000 mg 11/01/2019 Creatinine, Serum 0.900 mg/ 11/01/2019  PSA 0.979 ng/ 11/01/2019  10/24/2018: Hemoglobin A1c 5.7%.  Creatinine 0.9, EGFR 89/107, potassium 4.0, CMP normal.  CBC normal.  Cholesterol 150, triglycerides 71, HDL 50, LDL 86.  TSH normal.  06/16/2018: CBC normal.  Creatinine 1.04, EGFR 83/96, potassium 4.7, BMP normal.  Magnesium 2.1.  Medications and allergies   Allergies  Allergen Reactions  . Metoprolol Other (See Comments)    Lethargy, didn't feel like doing anything (tolerates low dose carvedilol)  . Valsartan Other (See Comments)    Increased blood pressure  . Azithromycin Itching and Other (See Comments)    Tingling in fingers  . Entresto [Sacubitril-Valsartan] Rash and Other  (See Comments)    Tingling in fingers/ redness & sensitivity at tips of fingers     Current Outpatient Medications  Medication Instructions  . acetaminophen (TYLENOL) 1,000 mg, Oral, Daily PRN  . atorvastatin (LIPITOR) 20 mg, Oral, Daily  . carvedilol (COREG) 25 mg, Oral, 2 times daily with meals  . diclofenac sodium (VOLTAREN) 2 g, Topical, As needed, Use as directed  . dofetilide (TIKOSYN) 500 MCG capsule TAKE 1 CAPSULE EVERY TWELVE HOURS.  [name] eplerenone (INSPRA) 25 mg, Oral, Daily  . olmesartan (BENICAR) 40 mg, Oral, Daily at bedtime  . omeprazole (PRILOSEC) 20 mg, Oral, As needed  .  rivaroxaban (XARELTO) 20 mg, Oral, Daily  . Vitamin D 2,000 Units, Oral, Daily   Radiology:   No results found.  Cardiac Studies:   Coronary angiogram 07/01/2015: Normal coronary arteries. LVEF 25-35%.   Sleep study 07/17/2015: Successful CPAP titration for OSA. Follows Dr. Rexene Alberts.  AF Ablation 02/05/2016: Successful electrical isolation and anatomical encircling of all four pulmonary veins with radiofrequency current (Dr. Allegra Lai)  Echocardiogram 08/01/2019: Left ventricle cavity is normal in size. Moderate concentric hypertrophy of the left ventricle. Mild global hypokinesis with mildly depressed LV systolic function with EF 45%. Doppler evidence of grade I (impaired) diastolic dysfunction, normal LAP.  Mild (Grade I) mitral regurgitation. Inadequate TR jet to estimate pulmonary artery systolic pressure. Normal right atrial pressure. Mild dilatation of proximal ascending aorta 4.1 cm, which is slightly less than measurement seen on previous study dated 01/26/2019.    EKG  EKG 04/16/2020: Sinus bradycardia at rate of 56 bpm, normal axis, incomplete right bundle branch block.  No evidence of ischemia. No significant change from 08/16/2019: Sinus rhythm with first-degree AV block at rate of 70 bpm, leftward axis.  Incomplete right bundle-branch.  No evidence of ischemia.  Baseline artifact but EKG  is interpretable.   Assessment     ICD-10-CM   1. Paroxysmal atrial fibrillation (HCC)  I48.0 EKG 12-Lead  2. Nonischemic cardiomyopathy (HCC)  I42.8   3. Essential hypertension  I10   4. OSA on CPAP  G47.33    Z99.89      No orders of the defined types were placed in this encounter.   Medications Discontinued During This Encounter  Medication Reason  . atorvastatin (LIPITOR) 10 MG tablet Error    Recommendations:   Todd [name].  is a Caucasian 54 y.o. male with nonischemic dilated cardiomyopathy (echocardiogram on 04/29/2015 revealing LVEF 30%, abnormal nuclear stress test on 05/12/2015 revealing inferior wall scar with mild ischemia and LVEF 19%); coronary angiogram on 07/01/2015 revealing no significant coronary artery disease.  Past medical history significant for hypertension, obstructive sleep apnea on CPAP, paroxysmal atrial fibrillation SP A. fib ablation in March 2017.  He has been doing well and remains asymptomatic, compliant with CPAP, I reviewed his external labs, except for very mild hyperlipidemia all labs are within normal limits including renal function and CBC.  He is tolerating anticoagulation without bleeding diathesis.  I'm beginning to wonder whether we should discontinue Xarelto as his cardioembolic risk is 9.4% as his ejection fraction is improved from 30% in 2016 and with rate control and conversion to sinus rhythm since 2017 ejection fraction is persistently been greater than 40% including the latest echocardiogram on 08/16/2019. Will ask Dr. Curt Bears regarding this.  CHA2DS2-VASc Score is 1.  Yearly risk of stroke: 1.2% (Hypertension).      Adrian Prows, MD, [facility] 04/16/2020, 10:00 AM [facility] Pager: [phone] Office: [phone]  CC: [employer]

## 2020-04-16 ENCOUNTER — Ambulatory Visit: Payer: BC Managed Care – PPO | Admitting: Cardiology

## 2020-04-16 ENCOUNTER — Other Ambulatory Visit: Payer: Self-pay

## 2020-04-16 ENCOUNTER — Encounter: Payer: Self-pay | Admitting: Cardiology

## 2020-04-16 VITALS — BP 107/75 | HR 64 | Temp 97.2°F | Resp 16 | Ht 66.0 in | Wt 227.4 lb

## 2020-04-16 DIAGNOSIS — I480 Paroxysmal atrial fibrillation: Secondary | ICD-10-CM | POA: Diagnosis not present

## 2020-04-16 DIAGNOSIS — I10 Essential (primary) hypertension: Secondary | ICD-10-CM | POA: Diagnosis not present

## 2020-04-16 DIAGNOSIS — G4733 Obstructive sleep apnea (adult) (pediatric): Secondary | ICD-10-CM

## 2020-04-16 DIAGNOSIS — I428 Other cardiomyopathies: Secondary | ICD-10-CM | POA: Diagnosis not present

## 2020-04-29 DIAGNOSIS — E7849 Other hyperlipidemia: Secondary | ICD-10-CM | POA: Diagnosis not present

## 2020-04-29 DIAGNOSIS — M7662 Achilles tendinitis, left leg: Secondary | ICD-10-CM | POA: Diagnosis not present

## 2020-04-29 DIAGNOSIS — M25511 Pain in right shoulder: Secondary | ICD-10-CM | POA: Diagnosis not present

## 2020-04-29 DIAGNOSIS — M545 Low back pain: Secondary | ICD-10-CM | POA: Diagnosis not present

## 2020-05-12 DIAGNOSIS — G4733 Obstructive sleep apnea (adult) (pediatric): Secondary | ICD-10-CM | POA: Diagnosis not present

## 2020-05-16 DIAGNOSIS — M7662 Achilles tendinitis, left leg: Secondary | ICD-10-CM | POA: Diagnosis not present

## 2020-05-16 DIAGNOSIS — M79672 Pain in left foot: Secondary | ICD-10-CM | POA: Diagnosis not present

## 2020-05-21 ENCOUNTER — Ambulatory Visit (INDEPENDENT_AMBULATORY_CARE_PROVIDER_SITE_OTHER): Payer: BC Managed Care – PPO

## 2020-05-21 ENCOUNTER — Other Ambulatory Visit: Payer: Self-pay

## 2020-05-21 ENCOUNTER — Ambulatory Visit (INDEPENDENT_AMBULATORY_CARE_PROVIDER_SITE_OTHER): Payer: BC Managed Care – PPO | Admitting: Podiatry

## 2020-05-21 VITALS — Temp 96.9°F

## 2020-05-21 DIAGNOSIS — M722 Plantar fascial fibromatosis: Secondary | ICD-10-CM | POA: Diagnosis not present

## 2020-05-21 DIAGNOSIS — Q666 Other congenital valgus deformities of feet: Secondary | ICD-10-CM

## 2020-05-21 DIAGNOSIS — M7662 Achilles tendinitis, left leg: Secondary | ICD-10-CM | POA: Diagnosis not present

## 2020-05-21 DIAGNOSIS — M79672 Pain in left foot: Secondary | ICD-10-CM | POA: Diagnosis not present

## 2020-05-22 ENCOUNTER — Encounter: Payer: Self-pay | Admitting: Podiatry

## 2020-05-22 ENCOUNTER — Other Ambulatory Visit: Payer: Self-pay | Admitting: Podiatry

## 2020-05-22 DIAGNOSIS — M722 Plantar fascial fibromatosis: Secondary | ICD-10-CM

## 2020-05-22 NOTE — Progress Notes (Signed)
Subjective:  Patient ID: Todd [name]., male    DOB: November 01, 1966,  MRN: [id number]  Chief Complaint  Patient presents with  . Foot Pain    L foot - bottom and back of heel. Pt stated, "I've had it for awhile, but it got better. I had tried changing my work boots. It suddenly got worse last week. Walking triggers it. Pain = 4/10. My PCP prescribed prednisone 20mg  - I've taken it for 5 days. Skipped today's dose ".    54 y.o. male presents with the above complaint.  Patient presents with left heel pain as well as left posterior heel pain.  Patient states it has been going on for a long period of time started back about a month ago.  Is 4 out of 10.  It gets better but then suddenly starts getting worse.  He has not been able to hardly walk over the last week.  Primary care gave him 20 mg prednisone which does help but however h hitting his pain once he started coming off of it.  Patient is stretching his work shoes.  He is constantly on his feet.  He has not tried anything else.  He would like to discuss other treatment options.  He has not seen anyone else prior to seeing me.   Review of Systems: Negative except as noted in the HPI. Denies N/V/F/Ch.  Past Medical History:  Diagnosis Date  . Atypical chest pain   . CHF (congestive heart failure) ([place])   . Dizziness   . GERD (gastroesophageal reflux disease)   . Gout   . HLD (hyperlipidemia)   . Hypertension   . IFG (impaired fasting glucose)   . Obesity   . OSA on CPAP   . PVC (premature ventricular contraction)   . Shortness of breath dyspnea     Current Outpatient Medications:  .  acetaminophen (TYLENOL) 500 MG tablet, Take 1,000 mg by mouth daily as needed for headache., Disp: , Rfl:  .  atorvastatin (LIPITOR) 20 MG tablet, Take 20 mg by mouth daily., Disp: , Rfl:  .  carvedilol (COREG) 25 MG tablet, Take 25 mg by mouth 2 (two) times daily with a meal., Disp: , Rfl:  .  Cholecalciferol (VITAMIN D) 2000 units tablet, Take  2,000 Units by mouth daily., Disp: , Rfl:  .  dofetilide (TIKOSYN) 500 MCG capsule, TAKE 1 CAPSULE EVERY TWELVE HOURS., Disp: 60 capsule, Rfl: 0 .  eplerenone (INSPRA) 25 MG tablet, Take 1 tablet (25 mg total) by mouth daily., Disp: 90 tablet, Rfl: 3 .  olmesartan (BENICAR) 40 MG tablet, Take 40 mg by mouth at bedtime., Disp: , Rfl:  .  omeprazole (PRILOSEC) 20 MG capsule, Take 20 mg by mouth as needed. , Disp: , Rfl:  .  predniSONE (DELTASONE) 20 MG tablet, Take 20 mg by mouth daily., Disp: , Rfl:  .  rivaroxaban (XARELTO) 20 MG TABS tablet, Take 20 mg by mouth daily., Disp: , Rfl:   Social History   Tobacco Use  Smoking Status Never Smoker  Smokeless Tobacco Never Used    Allergies  Allergen Reactions  . Metoprolol Other (See Comments)    Lethargy, didn't feel like doing anything (tolerates low dose carvedilol)  . Valsartan Other (See Comments)    Increased blood pressure  . Azithromycin Itching and Other (See Comments)    Tingling in fingers  . Entresto [Sacubitril-Valsartan] Rash and Other (See Comments)    Tingling in fingers/ redness & sensitivity at  tips of fingers   Objective:   Vitals:   05/21/20 0900  Temp: (!) 96.9 F (36.1 C)   There is no height or weight on file to calculate BMI. Constitutional Well developed. Well nourished.  Vascular Dorsalis pedis pulses palpable bilaterally. Posterior tibial pulses palpable bilaterally. Capillary refill normal to all digits.  No cyanosis or clubbing noted. Pedal hair growth normal.  Neurologic Normal speech. Oriented to person, place, and time. Epicritic sensation to light touch grossly present bilaterally.  Dermatologic Nails well groomed and normal in appearance. No open wounds. No skin lesions.  Orthopedic: Normal joint ROM without pain or crepitus bilaterally. No visible deformities. Tender to palpation at the calcaneal tuber left. No pain with calcaneal squeeze left. Ankle ROM diminished range of motion  left. Silfverskiold Test: positive left.  Left Achilles tendinitis with pain on palpation to the insertion of the Achilles tendon.  No pain along the course of the Achilles tendon itself.  Pain with dorsiflexion.  Pain relief with plantarflexion of the foot.  These were both consistent with active and passive range of motion.   Radiographs: Taken and reviewed. No acute fractures or dislocations. No evidence of stress fracture.  Plantar heel spur present. Posterior heel spur present.   Assessment:   1. Foot pain, left   2. Plantar fasciitis, left   3. Achilles tendinitis, left leg   4. Pes planovalgus    Plan:  Patient was evaluated and treated and all questions answered.  Left Achilles tendinitis -I explained to the patient the etiology of Achilles tendinitis and various treatment options were discussed.  Patient states that the Achilles tendinitis can became first and then due to compensation the plantar fasciitis started acting up.  I will plan on treating both of them aggressively.  I believe patient will benefit from steroid injection in the Cager's fat pad in an attempt to decrease the inflammatory component associated with pain.  Patient agrees with the plan would like to proceed with injection.  I did discuss with the patient that there is a high risk of rupture associated with steroid injection in the Achilles.  Patient states understanding and would like to proceed despite the risks. -A steroid injection was performed at left Kager's fat pad using 1% plain Lidocaine and 10 mg of Kenalog. This was well tolerated.   Pes planovalgus bilaterally -I explained to patient the etiology of pes planovalgus and various treatment options were extensively discussed.  I believe patient will benefit from custom-made orthotics to help support the arch of the foot take the pressure off of the plantar fascia as well as support the Achilles tendinitis.  Patient will follow up with [facility] for custom-made  orthotics.  Plantar Fasciitis, left - XR reviewed as above.  - Educated on icing and stretching. Instructions given.  - Injection delivered to the plantar fascia as below. - DME: Plantar Fascial Brace - Pharmacologic management: Meloxicam/Medrol Dose Pak. Educated on risks/benefits and proper taking of medication.  Procedure: Injection Tendon/Ligament Location: Left plantar fascia at the glabrous junction; medial approach. Skin Prep: alcohol Injectate: 0.5 cc 0.5% marcaine plain, 0.5 cc of 1% Lidocaine, 0.5 cc kenalog 10. Disposition: Patient tolerated procedure well. Injection site dressed with a band-aid.  Return in about 4 weeks (around 06/18/2020), or See Dr. Allena Katz, for See Raiford Noble for orthotics ASAP.

## 2020-05-29 ENCOUNTER — Other Ambulatory Visit: Payer: Self-pay

## 2020-05-29 ENCOUNTER — Ambulatory Visit: Payer: BC Managed Care – PPO | Admitting: Orthotics

## 2020-05-29 DIAGNOSIS — M722 Plantar fascial fibromatosis: Secondary | ICD-10-CM

## 2020-05-29 DIAGNOSIS — Q666 Other congenital valgus deformities of feet: Secondary | ICD-10-CM | POA: Diagnosis not present

## 2020-05-29 DIAGNOSIS — M79672 Pain in left foot: Secondary | ICD-10-CM

## 2020-05-29 DIAGNOSIS — M7662 Achilles tendinitis, left leg: Secondary | ICD-10-CM | POA: Diagnosis not present

## 2020-05-29 NOTE — Progress Notes (Signed)
Patient came into today to be cast for Custom Foot Orthotics. Upon recommendation of Dr. Allena Katz  Patient presents with achilles tendontis as well as plantar fas  Goals are Plan vendor

## 2020-05-30 DIAGNOSIS — H1789 Other corneal scars and opacities: Secondary | ICD-10-CM | POA: Diagnosis not present

## 2020-05-30 DIAGNOSIS — T1511XA Foreign body in conjunctival sac, right eye, initial encounter: Secondary | ICD-10-CM | POA: Diagnosis not present

## 2020-06-11 DIAGNOSIS — G4733 Obstructive sleep apnea (adult) (pediatric): Secondary | ICD-10-CM | POA: Diagnosis not present

## 2020-06-18 ENCOUNTER — Ambulatory Visit: Payer: BC Managed Care – PPO | Admitting: Podiatry

## 2020-06-19 ENCOUNTER — Ambulatory Visit: Payer: BC Managed Care – PPO | Admitting: Orthotics

## 2020-06-19 ENCOUNTER — Other Ambulatory Visit: Payer: Self-pay

## 2020-06-19 DIAGNOSIS — M79672 Pain in left foot: Secondary | ICD-10-CM

## 2020-06-19 DIAGNOSIS — Q666 Other congenital valgus deformities of feet: Secondary | ICD-10-CM

## 2020-06-19 DIAGNOSIS — M722 Plantar fascial fibromatosis: Secondary | ICD-10-CM

## 2020-06-19 NOTE — Progress Notes (Signed)
Patient came in today to pick up custom made foot orthotics.  The goals were accomplished and the patient reported no dissatisfaction with said orthotics.  Patient was advised of breakin period and how to report any issues.Patient came in today to pick up custom made foot orthotics.  The goals were accomplished and the patient reported no dissatisfaction with said orthotics.  Patient was advised of breakin period and how to report any issues. 

## 2020-06-25 DIAGNOSIS — H04123 Dry eye syndrome of bilateral lacrimal glands: Secondary | ICD-10-CM | POA: Diagnosis not present

## 2020-06-25 DIAGNOSIS — H5213 Myopia, bilateral: Secondary | ICD-10-CM | POA: Diagnosis not present

## 2020-07-02 ENCOUNTER — Other Ambulatory Visit: Payer: Self-pay

## 2020-07-02 ENCOUNTER — Ambulatory Visit (INDEPENDENT_AMBULATORY_CARE_PROVIDER_SITE_OTHER): Payer: BC Managed Care – PPO | Admitting: Podiatry

## 2020-07-02 ENCOUNTER — Other Ambulatory Visit: Payer: Self-pay | Admitting: Cardiology

## 2020-07-02 DIAGNOSIS — M7662 Achilles tendinitis, left leg: Secondary | ICD-10-CM | POA: Diagnosis not present

## 2020-07-02 DIAGNOSIS — M79672 Pain in left foot: Secondary | ICD-10-CM | POA: Diagnosis not present

## 2020-07-02 DIAGNOSIS — M722 Plantar fascial fibromatosis: Secondary | ICD-10-CM | POA: Diagnosis not present

## 2020-07-02 DIAGNOSIS — I480 Paroxysmal atrial fibrillation: Secondary | ICD-10-CM

## 2020-07-02 DIAGNOSIS — B353 Tinea pedis: Secondary | ICD-10-CM

## 2020-07-02 DIAGNOSIS — G4733 Obstructive sleep apnea (adult) (pediatric): Secondary | ICD-10-CM

## 2020-07-03 ENCOUNTER — Telehealth: Payer: Self-pay | Admitting: Podiatry

## 2020-07-03 ENCOUNTER — Other Ambulatory Visit: Payer: Self-pay | Admitting: Cardiology

## 2020-07-03 ENCOUNTER — Encounter: Payer: Self-pay | Admitting: Podiatry

## 2020-07-03 DIAGNOSIS — I480 Paroxysmal atrial fibrillation: Secondary | ICD-10-CM

## 2020-07-03 MED ORDER — CLOTRIMAZOLE-BETAMETHASONE 1-0.05 % EX CREA: 1.0000 "application " | TOPICAL_CREAM | Freq: Two times a day (BID) | CUTANEOUS | 0 refills | Status: DC

## 2020-07-03 NOTE — Telephone Encounter (Signed)
Refill request

## 2020-07-03 NOTE — Progress Notes (Signed)
Subjective:  Patient ID: Todd Paradise., male    DOB: January 24, 1966,  MRN: [id number]  Chief Complaint  Patient presents with  . Foot Pain    pt is here for a f/u of left foot pain. Pt states that the foot pain is doing alot better. But is concerned that his insertsd might be rubbing up against the shoe.    54 y.o. male presents with the above complaint.  Patient presents with a follow-up of left plantar fasciitis as well as left Achilles tendinitis.  Patient states his pain has gotten much better.  Patient states that there is some rubbing against the back of the shoe when wearing the orthotics however she is feeling much better since the first time he came to see me.  He has secondary complaint of right foot at least foot with itching and epidermal lysis to the skin noted.  Patient has not done anything to help treated.  He would like to discuss treatment options for this.   Review of Systems: Negative except as noted in the HPI. Denies N/V/F/Ch.  Past Medical History:  Diagnosis Date  . Atypical chest pain   . CHF (congestive heart failure) (HCC)   . Dizziness   . GERD (gastroesophageal reflux disease)   . Gout   . HLD (hyperlipidemia)   . Hypertension   . IFG (impaired fasting glucose)   . Obesity   . OSA on CPAP   . PVC (premature ventricular contraction)   . Shortness of breath dyspnea     Current Outpatient Medications:  .  acetaminophen (TYLENOL) 500 MG tablet, Take 1,000 mg by mouth daily as needed for headache., Disp: , Rfl:  .  atorvastatin (LIPITOR) 20 MG tablet, Take 20 mg by mouth daily., Disp: , Rfl:  .  carvedilol (COREG) 25 MG tablet, Take 25 mg by mouth 2 (two) times daily with a meal., Disp: , Rfl:  .  Cholecalciferol (VITAMIN D) 2000 units tablet, Take 2,000 Units by mouth daily., Disp: , Rfl:  .  eplerenone (INSPRA) 25 MG tablet, Take 1 tablet (25 mg total) by mouth daily., Disp: 90 tablet, Rfl: 3 .  olmesartan (BENICAR) 40 MG tablet, Take 40 mg by mouth  at bedtime., Disp: , Rfl:  .  omeprazole (PRILOSEC) 20 MG capsule, Take 20 mg by mouth as needed. , Disp: , Rfl:  .  predniSONE (DELTASONE) 20 MG tablet, Take 20 mg by mouth daily., Disp: , Rfl:  .  rivaroxaban (XARELTO) 20 MG TABS tablet, Take 20 mg by mouth daily., Disp: , Rfl:  .  clotrimazole-betamethasone (LOTRISONE) cream, Apply 1 application topically 2 (two) times daily., Disp: 30 g, Rfl: 0 .  dofetilide (TIKOSYN) 500 MCG capsule, TAKE 1 CAPSULE EVERY TWELVE HOURS., Disp: 180 capsule, Rfl: 3  Social History   Tobacco Use  Smoking Status Never Smoker  Smokeless Tobacco Never Used    Allergies  Allergen Reactions  . Metoprolol Other (See Comments)    Lethargy, didn't feel like doing anything (tolerates low dose carvedilol)  . Valsartan Other (See Comments)    Increased blood pressure  . Azithromycin Itching and Other (See Comments)    Tingling in fingers  . Entresto [Sacubitril-Valsartan] Rash and Other (See Comments)    Tingling in fingers/ redness & sensitivity at tips of fingers   Objective:   There were no vitals filed for this visit. There is no height or weight on file to calculate BMI. Constitutional Well developed. Well nourished.  Vascular  Dorsalis pedis pulses palpable bilaterally. Posterior tibial pulses palpable bilaterally. Capillary refill normal to all digits.  No cyanosis or clubbing noted. Pedal hair growth normal.  Neurologic Normal speech. Oriented to person, place, and time. Epicritic sensation to light touch grossly present bilaterally.  Dermatologic Nails well groomed and normal in appearance. No open wounds. No skin lesions.  Orthopedic: Normal joint ROM without pain or crepitus bilaterally. No visible deformities. No tender to palpation at the calcaneal tuber left. No pain with calcaneal squeeze left. Ankle ROM diminished range of motion left. Silfverskiold Test: positive left.  Left Achilles tendinitis without pain on palpation to the  insertion of the Achilles tendon.  No pain along the course of the Achilles tendon itself.  No pain with dorsiflexion.  No pain relief with plantarflexion of the foot.  These were both consistent with active and passive range of motion.   Radiographs: Taken and reviewed. No acute fractures or dislocations. No evidence of stress fracture.  Plantar heel spur present. Posterior heel spur present.   Assessment:   No diagnosis found. Plan:  Patient was evaluated and treated and all questions answered.  Right athlete's foot -I explained patient the etiology of athlete's foot and various treatment options were extensively discussed.  Given the amount of itching as well as epidermal lysis of the superficial skin I believe patient will benefit from Lotrisone cream.  Patient states understanding will apply twice a day. -Lotrisone cream was dispensed.  Left Achilles tendinitis -Clinically resolved with orthotics.  Patient will continue her orthotics and break them inappropriately.  I discussed the break-in period in extensive detail.  Patient states understanding..   Pes planovalgus bilaterally -I explained to patient the etiology of pes planovalgus and various treatment options were extensively discussed.  I believe patient will benefit from custom-made orthotics to help support the arch of the foot take the pressure off of the plantar fascia as well as support the Achilles tendinitis.  Patient will follow up with [facility] for custom-made orthotics.  Plantar Fasciitis, left - Clinically resolved.  Patient no longer has any pain to the medial calcaneal tuber.  He has been wearing his orthotics which has helped a lot.  No follow-ups on file.

## 2020-07-03 NOTE — Telephone Encounter (Signed)
ICD-10-CM   1. Paroxysmal atrial fibrillation (HCC)  I48.0 dofetilide (TIKOSYN) 500 MCG capsule    Basic metabolic panel    Magnesium  2. Obstructive sleep apnea hypopnea, severe  G47.33     Yates Decamp, MD, [facility] 07/03/2020, 9:42 AM Office: [phone]

## 2020-07-03 NOTE — Telephone Encounter (Signed)
PT told the pharmacy that a cream was supposed to be called in and the pharmacy was following up on that order. They have not received it.

## 2020-07-08 NOTE — Progress Notes (Signed)
Messaged patient.

## 2020-07-11 DIAGNOSIS — G4733 Obstructive sleep apnea (adult) (pediatric): Secondary | ICD-10-CM | POA: Diagnosis not present

## 2020-07-15 DIAGNOSIS — G4733 Obstructive sleep apnea (adult) (pediatric): Secondary | ICD-10-CM | POA: Diagnosis not present

## 2020-07-15 DIAGNOSIS — Z1152 Encounter for screening for COVID-19: Secondary | ICD-10-CM | POA: Diagnosis not present

## 2020-07-15 DIAGNOSIS — J069 Acute upper respiratory infection, unspecified: Secondary | ICD-10-CM | POA: Diagnosis not present

## 2020-07-24 ENCOUNTER — Other Ambulatory Visit: Payer: Self-pay

## 2020-07-24 ENCOUNTER — Encounter: Payer: Self-pay | Admitting: Cardiology

## 2020-07-24 ENCOUNTER — Ambulatory Visit (INDEPENDENT_AMBULATORY_CARE_PROVIDER_SITE_OTHER): Payer: BC Managed Care – PPO | Admitting: Cardiology

## 2020-07-24 VITALS — BP 104/70 | HR 67 | Ht 66.0 in | Wt 227.0 lb

## 2020-07-24 DIAGNOSIS — I4819 Other persistent atrial fibrillation: Secondary | ICD-10-CM | POA: Diagnosis not present

## 2020-07-24 MED ORDER — CARVEDILOL 12.5 MG PO TABS
12.5000 mg | ORAL_TABLET | Freq: Two times a day (BID) | ORAL | 3 refills | Status: DC
Start: 2020-07-24 — End: 2020-10-16

## 2020-07-24 NOTE — Addendum Note (Signed)
Addended by: Baird Lyons on: 07/24/2020 10:39 AM   Modules accepted: Orders

## 2020-07-24 NOTE — Progress Notes (Signed)
Electrophysiology Office Note   Date:  07/24/2020   ID:  Todd [name]., DOB [birth year], MRN [id number]  PCP:  Martha Clan, MD  Cardiologist:  [name] Primary Electrophysiologist:  Amen Dargis [name], MD    No chief complaint on file.    History of Present Illness: Todd [name]. is a 54 y.o. male who presents today for electrophysiology evaluation.   He has a history of systolic HF with an EF of 30%. He had a cath which showed no CAD.Todd [name] He also has OSA and is on CPAP, HTN, HLD, morbid obesity, as well as occasional PVCs. He presents today for follow-up of his atrial fibrillation. He had ablation on 02/05/16.  He has been feeling well since last being seen. He is able to do most of his daily activities without issue. He does have a few episodes of palpitations that last up to 15 seconds, but none that are prolonged.  Today, denies symptoms of palpitations, chest pain, shortness of breath, orthopnea, PND, lower extremity edema, claudication, dizziness, presyncope, syncope, bleeding, or neurologic sequela. The patient is tolerating medications without difficulties.  Overall he is doing well.  He has no chest pain or shortness of breath.  Is able to do all of his daily activities without restriction.  He does say that he is been somewhat fatigued.  This has not been overall limiting but he does say that there is quite a nuisance.   Past Medical History:  Diagnosis Date  . Atypical chest pain   . CHF (congestive heart failure) (HCC)   . Dizziness   . GERD (gastroesophageal reflux disease)   . Gout   . HLD (hyperlipidemia)   . Hypertension   . IFG (impaired fasting glucose)   . Obesity   . OSA on CPAP   . PVC (premature ventricular contraction)   . Shortness of breath dyspnea    Past Surgical History:  Procedure Laterality Date  . CARDIAC CATHETERIZATION N/A 07/01/2015   Procedure: Left Heart Cath;  Surgeon: Yates Decamp, MD;  Location: [facility] INVASIVE CV LAB;  Service:  Cardiovascular;  Laterality: N/A;  . CARPAL TUNNEL RELEASE    . ELECTROPHYSIOLOGIC STUDY N/A 02/05/2016   Procedure: Atrial Fibrillation Ablation;  Surgeon: Urbano Milhouse [name], MD;  Location: [facility] INVASIVE CV LAB;  Service: Cardiovascular;  Laterality: N/A;  . NASAL SEPTOPLASTY W/ TURBINOPLASTY Bilateral 09/14/2016   Procedure: NASAL SEPTOPLASTY WITH BILATERAL  TURBINATE REDUCTION;  Surgeon: Newman Pies, MD;  Location: [facility];  Service: ENT;  Laterality: Bilateral;     Current Outpatient Medications  Medication Sig Dispense Refill  . acetaminophen (TYLENOL) 500 MG tablet Take 1,000 mg by mouth daily as needed for headache.    Todd [name] atorvastatin (LIPITOR) 20 MG tablet Take 20 mg by mouth daily.    . carvedilol (COREG) 25 MG tablet Take 25 mg by mouth 2 (two) times daily with a meal.    . Cholecalciferol (VITAMIN D) 2000 units tablet Take 2,000 Units by mouth daily.    . clotrimazole-betamethasone (LOTRISONE) cream Apply 1 application topically 2 (two) times daily. 30 g 0  . dofetilide (TIKOSYN) 500 MCG capsule TAKE 1 CAPSULE EVERY TWELVE HOURS. 180 capsule 3  . eplerenone (INSPRA) 25 MG tablet Take 1 tablet (25 mg total) by mouth daily. 90 tablet 3  . olmesartan (BENICAR) 40 MG tablet Take 40 mg by mouth at bedtime.    Todd [name] omeprazole (PRILOSEC) 20 MG capsule Take 20 mg by mouth as needed.     Todd [name]  predniSONE (DELTASONE) 20 MG tablet Take 20 mg by mouth daily.    . rivaroxaban (XARELTO) 20 MG TABS tablet Take 20 mg by mouth daily.     No current facility-administered medications for this visit.    Allergies:   Metoprolol, Valsartan, Azithromycin, and Entresto [sacubitril-valsartan]   Social History:  The patient  reports that he has never smoked. He has never used smokeless tobacco. He reports current alcohol use of about 1.0 standard drink of alcohol per week. He reports that he does not use drugs.   Family History:  The patient's family history includes COPD in his mother; Deep vein  thrombosis in his mother; Diabetes in his father; Heart failure in his mother; Hypertension in his father; Lung cancer in his mother; Prostate cancer in his father; Seizures in his mother; Stroke in his paternal grandfather.    ROS:  Please see the history of present illness.   Otherwise, review of systems is positive for none.   All other systems are reviewed and negative.   PHYSICAL EXAM: VS:  BP 104/70   Pulse 67   Ht 5\' 6"  (1.676 m)   Wt 227 lb (103 kg)   SpO2 96%   BMI 36.64 kg/m  , BMI Body mass index is 36.64 kg/m. GEN: Well nourished, well developed, in no acute distress  HEENT: normal  Neck: no JVD, carotid bruits, or masses Cardiac: RRR; no murmurs, rubs, or gallops,no edema  Respiratory:  clear to auscultation bilaterally, normal work of breathing GI: soft, nontender, nondistended, + BS MS: no deformity or atrophy  Skin: warm and dry Neuro:  Strength and sensation are intact Psych: euthymic mood, full affect  EKG:  EKG is ordered today. Personal review of the ekg ordered shows sinus rhythm, rate 67  Recent Labs: No results found for requested labs within last 8760 hours.    Lipid Panel     Component Value Date/Time   CHOL 166 12/08/2015 0352   TRIG 89 12/08/2015 0352   HDL 37 (L) 12/08/2015 0352   CHOLHDL 4.5 12/08/2015 0352   VLDL 18 12/08/2015 0352   LDLCALC 111 (H) 12/08/2015 0352     Wt Readings from Last 3 Encounters:  07/24/20 227 lb (103 kg)  04/16/20 227 lb 6.4 oz (103.1 kg)  02/20/20 231 lb (104.8 kg)      Other studies Reviewed: Additional studies/ records that were reviewed today include: TTE 12/08/15  Review of the above records today demonstrates:  - Left ventricle: The cavity size was mildly dilated. There was mild concentric hypertrophy. Systolic function was severely reduced. The estimated ejection fraction was in the range of 20% to 25%. Diffuse hypokinesis. Features are consistent with a pseudonormal left ventricular filling  pattern, with concomitant abnormal relaxation and increased filling pressure (grade 2 diastolic dysfunction). Doppler parameters are consistent with both elevated ventricular end-diastolic filling pressure and elevated left atrial filling pressure. - Mitral valve: There was mild to moderate regurgitation.  TTE 05/26/16 -Left ventricle cavity is moderately dilated at 6.5 cm. Moderate concentric LVH. Mild diffuse hypokinesis. Normal diastolic filling pattern. Ejection fraction 43% -Left atrial cavity mildly dilated -Grade 1 mitral regurgitation  ASSESSMENT AND PLAN:  1.  Persistent atrial fibrillation: Currently on Xarelto.  Status post ablation 02/05/2016.  CHA2DS2-VASc of 2.  Remains in sinus rhythm.  He is having some fatigue and thus we Alpheus Stiff decrease his carvedilol to 12.5 mg which I Kunio Cummiskey discuss with his primary cardiologist.  No other changes.   2.  Hypertension: Currently well controlled  3.  Chronic systolic heart failure: Ejection fraction has improved post ablation.  Well compensated.  No changes.    4.  Obstructive sleep apnea: Patient is compliant with CPAP.  5.  PVCs: Appear to be outflow tract related.  Patient has been asymptomatic.  No changes.  Current medicines are reviewed at length with the patient today.   The patient does not have concerns regarding his medicines.  The following changes were made today: Decrease carvedilol  Labs/ tests ordered today include:  Orders Placed This Encounter  Procedures  . EKG 12-Lead     Disposition:   FU with Hasel Janish 12 months  Signed, Lars Jeziorski [name], MD  07/24/2020 9:53 AM     [facility] [address] [place] [address] [phone] (office) [phone] (fax)

## 2020-08-11 DIAGNOSIS — G4733 Obstructive sleep apnea (adult) (pediatric): Secondary | ICD-10-CM | POA: Diagnosis not present

## 2020-09-10 DIAGNOSIS — G4733 Obstructive sleep apnea (adult) (pediatric): Secondary | ICD-10-CM | POA: Diagnosis not present

## 2020-09-16 DIAGNOSIS — H04121 Dry eye syndrome of right lacrimal gland: Secondary | ICD-10-CM | POA: Diagnosis not present

## 2020-10-10 DIAGNOSIS — G4733 Obstructive sleep apnea (adult) (pediatric): Secondary | ICD-10-CM | POA: Diagnosis not present

## 2020-10-14 DIAGNOSIS — Z8679 Personal history of other diseases of the circulatory system: Secondary | ICD-10-CM | POA: Diagnosis not present

## 2020-10-16 ENCOUNTER — Telehealth: Payer: Self-pay | Admitting: Cardiology

## 2020-10-16 MED ORDER — CARVEDILOL 25 MG PO TABS
12.5000 mg | ORAL_TABLET | Freq: Two times a day (BID) | ORAL | 2 refills | Status: DC
Start: 2020-10-16 — End: 2021-07-21

## 2020-10-16 NOTE — Telephone Encounter (Signed)
Pt reports that since he started taking the 12.5 mg tablets he has been more tired/sleepy. He spoke w/ pharmacist who thinks that maybe a filler in the 12.5 mg tablets could possbily be the culprit. Pt would like to try the 25 mg tablets again, as he had no issues with these, and just cut them in half. Rx sent as requested. Pt will call if no improvement, he appreciates my help with this.

## 2020-10-16 NOTE — Telephone Encounter (Signed)
Patient needs to talk with Dr. Elberta Fortis or nurse regarding medication carvedilol (COREG) 12.5 MG tablet. Please call bac

## 2020-11-06 DIAGNOSIS — E785 Hyperlipidemia, unspecified: Secondary | ICD-10-CM | POA: Diagnosis not present

## 2020-11-06 DIAGNOSIS — Z125 Encounter for screening for malignant neoplasm of prostate: Secondary | ICD-10-CM | POA: Diagnosis not present

## 2020-11-06 DIAGNOSIS — R7301 Impaired fasting glucose: Secondary | ICD-10-CM | POA: Diagnosis not present

## 2020-11-06 DIAGNOSIS — Z0000 Encounter for general adult medical examination without abnormal findings: Secondary | ICD-10-CM | POA: Diagnosis not present

## 2020-11-10 DIAGNOSIS — G4733 Obstructive sleep apnea (adult) (pediatric): Secondary | ICD-10-CM | POA: Diagnosis not present

## 2020-11-13 DIAGNOSIS — Z1339 Encounter for screening examination for other mental health and behavioral disorders: Secondary | ICD-10-CM | POA: Diagnosis not present

## 2020-11-13 DIAGNOSIS — N529 Male erectile dysfunction, unspecified: Secondary | ICD-10-CM | POA: Diagnosis not present

## 2020-11-13 DIAGNOSIS — Z0000 Encounter for general adult medical examination without abnormal findings: Secondary | ICD-10-CM | POA: Diagnosis not present

## 2020-11-13 DIAGNOSIS — R5383 Other fatigue: Secondary | ICD-10-CM | POA: Diagnosis not present

## 2020-11-13 DIAGNOSIS — Z1331 Encounter for screening for depression: Secondary | ICD-10-CM | POA: Diagnosis not present

## 2020-11-13 DIAGNOSIS — I10 Essential (primary) hypertension: Secondary | ICD-10-CM | POA: Diagnosis not present

## 2020-11-13 DIAGNOSIS — Z23 Encounter for immunization: Secondary | ICD-10-CM | POA: Diagnosis not present

## 2020-11-19 DIAGNOSIS — H16101 Unspecified superficial keratitis, right eye: Secondary | ICD-10-CM | POA: Diagnosis not present

## 2020-11-19 DIAGNOSIS — H5711 Ocular pain, right eye: Secondary | ICD-10-CM | POA: Diagnosis not present

## 2020-11-19 DIAGNOSIS — H1789 Other corneal scars and opacities: Secondary | ICD-10-CM | POA: Diagnosis not present

## 2020-11-19 DIAGNOSIS — H04121 Dry eye syndrome of right lacrimal gland: Secondary | ICD-10-CM | POA: Diagnosis not present

## 2020-12-10 DIAGNOSIS — G4733 Obstructive sleep apnea (adult) (pediatric): Secondary | ICD-10-CM | POA: Diagnosis not present

## 2020-12-29 DIAGNOSIS — G4733 Obstructive sleep apnea (adult) (pediatric): Secondary | ICD-10-CM | POA: Diagnosis not present

## 2021-01-09 DIAGNOSIS — G4733 Obstructive sleep apnea (adult) (pediatric): Secondary | ICD-10-CM | POA: Diagnosis not present

## 2021-01-14 DIAGNOSIS — J0100 Acute maxillary sinusitis, unspecified: Secondary | ICD-10-CM | POA: Diagnosis not present

## 2021-01-14 DIAGNOSIS — Z7189 Other specified counseling: Secondary | ICD-10-CM | POA: Diagnosis not present

## 2021-01-14 DIAGNOSIS — R059 Cough, unspecified: Secondary | ICD-10-CM | POA: Diagnosis not present

## 2021-02-09 DIAGNOSIS — G4733 Obstructive sleep apnea (adult) (pediatric): Secondary | ICD-10-CM | POA: Diagnosis not present

## 2021-02-23 ENCOUNTER — Ambulatory Visit: Payer: BC Managed Care – PPO | Admitting: Adult Health

## 2021-03-04 ENCOUNTER — Other Ambulatory Visit: Payer: Self-pay | Admitting: Cardiology

## 2021-03-04 DIAGNOSIS — I480 Paroxysmal atrial fibrillation: Secondary | ICD-10-CM

## 2021-03-06 ENCOUNTER — Other Ambulatory Visit: Payer: Self-pay | Admitting: Cardiology

## 2021-03-06 DIAGNOSIS — I480 Paroxysmal atrial fibrillation: Secondary | ICD-10-CM

## 2021-03-11 DIAGNOSIS — G4733 Obstructive sleep apnea (adult) (pediatric): Secondary | ICD-10-CM | POA: Diagnosis not present

## 2021-03-18 ENCOUNTER — Ambulatory Visit (INDEPENDENT_AMBULATORY_CARE_PROVIDER_SITE_OTHER): Payer: BC Managed Care – PPO | Admitting: Adult Health

## 2021-03-18 VITALS — BP 106/79 | HR 57 | Ht 66.0 in | Wt 225.0 lb

## 2021-03-18 DIAGNOSIS — G4733 Obstructive sleep apnea (adult) (pediatric): Secondary | ICD-10-CM

## 2021-03-18 DIAGNOSIS — Z9989 Dependence on other enabling machines and devices: Secondary | ICD-10-CM

## 2021-03-18 NOTE — Patient Instructions (Signed)
Continue using CPAP nightly and greater than 4 hours each night °If your symptoms worsen or you develop new symptoms please let us know.  ° °

## 2021-03-18 NOTE — Progress Notes (Signed)
PATIENT: Todd [name]. DOB: 23-Dec-1965  REASON FOR VISIT: follow up HISTORY FROM: patient  HISTORY OF PRESENT ILLNESS: Today 03/18/21: Todd [name] is a 55 year old male with a history of obstructive sleep apnea on CPAP.  He returns today for follow-up.  He reports that the CPAP is working well for him.  He denies any new issues.  He returns today for an evaluation.    02/16/21: Todd [name] is a 55 year old male with a history of obstructive sleep apnea on CPAP.  His download indicates that he use his machine nightly for compliance of 100%.  He uses machine greater than 4 hours 29 out of 30 days for compliance of 97%.  On average he uses his machine 6 hours and 26 minutes.  His residual AHI is 0.9 on 16.4 cm of water with EPR of 2.  Leak in the 95th percentile is 14.6 L/min.  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  HISTORY 1/2/2020CM Todd [name] 55 year old male returns for follow-up with history of obstructive sleep apnea here for CPAP compliance.  He reports he has not been sleeping well the last couple of weeks.  His wife fell and suffered a subdural hematoma.  He has been anxious over this.  He continues to work 12-hour night shifts.  Compliance data dated 10/30/2018-11/28/2018 shows compliance greater than 4 hours at 100%.  Average usage 6 hours 14 minutes.  Set pressure 17.6 cm AHI 1.6.  He returns for reevaluation  REVIEW OF SYSTEMS: Out of a complete 14 system review of symptoms, the patient complains only of the following symptoms, and all other reviewed systems are negative.  FSS 24 ESS 7  ALLERGIES: Allergies  Allergen Reactions  . Metoprolol Other (See Comments)    Lethargy, didn't feel like doing anything (tolerates low dose carvedilol)  . Valsartan Other (See Comments)    Increased blood pressure  . Azithromycin Itching and Other (See Comments)    Tingling in fingers  . Entresto [Sacubitril-Valsartan] Rash and Other (See Comments)     Tingling in fingers/ redness & sensitivity at tips of fingers    HOME MEDICATIONS: Outpatient Medications Prior to Visit  Medication Sig Dispense Refill  . acetaminophen (TYLENOL) 500 MG tablet Take 1,000 mg by mouth daily as needed for headache.    . carvedilol (COREG) 25 MG tablet Take 0.5 tablets (12.5 mg total) by mouth 2 (two) times daily. 90 tablet 2  . Cholecalciferol (VITAMIN D) 2000 units tablet Take 2,000 Units by mouth daily.    . clotrimazole-betamethasone (LOTRISONE) cream Apply 1 application topically 2 (two) times daily. 30 g 0  . dofetilide (TIKOSYN) 500 MCG capsule TAKE 1 CAPSULE EVERY TWELVE HOURS. 180 capsule 3  . eplerenone (INSPRA) 25 MG tablet TAKE 1 TABLET BY MOUTH DAILY. 30 tablet 11  . olmesartan (BENICAR) 40 MG tablet Take 40 mg by mouth at bedtime.    [name] omeprazole (PRILOSEC) 20 MG capsule Take 20 mg by mouth as needed.     . predniSONE (DELTASONE) 20 MG tablet Take 20 mg by mouth daily.    . rivaroxaban (XARELTO) 20 MG TABS tablet Take 20 mg by mouth daily.    [name] atorvastatin (LIPITOR) 20 MG tablet Take 20 mg by mouth daily.     No facility-administered medications prior to visit.    PAST MEDICAL HISTORY: Past Medical History:  Diagnosis Date  . Atypical chest pain   . CHF (congestive heart failure) (HCC)   .  Dizziness   . GERD (gastroesophageal reflux disease)   . Gout   . HLD (hyperlipidemia)   . Hypertension   . IFG (impaired fasting glucose)   . Obesity   . OSA on CPAP   . PVC (premature ventricular contraction)   . Shortness of breath dyspnea     PAST SURGICAL HISTORY: Past Surgical History:  Procedure Laterality Date  . CARDIAC CATHETERIZATION N/A 07/01/2015   Procedure: Left Heart Cath;  Surgeon: Yates Decamp, MD;  Location: [facility] INVASIVE CV LAB;  Service: Cardiovascular;  Laterality: N/A;  . CARPAL TUNNEL RELEASE    . ELECTROPHYSIOLOGIC STUDY N/A 02/05/2016   Procedure: Atrial Fibrillation Ablation;  Surgeon: Will [name], MD;  Location: [facility]  INVASIVE CV LAB;  Service: Cardiovascular;  Laterality: N/A;  . NASAL SEPTOPLASTY W/ TURBINOPLASTY Bilateral 09/14/2016   Procedure: NASAL SEPTOPLASTY WITH BILATERAL  TURBINATE REDUCTION;  Surgeon: Newman Pies, MD;  Location: [facility];  Service: ENT;  Laterality: Bilateral;    FAMILY HISTORY: Family History  Problem Relation Age of Onset  . Seizures Mother   . Deep vein thrombosis Mother   . COPD Mother   . Heart failure Mother   . Lung cancer Mother   . Prostate cancer Father   . Hypertension Father   . Diabetes Father   . Stroke Paternal Grandfather   . Heart attack Neg Hx     SOCIAL HISTORY: Social History   Socioeconomic History  . Marital status: Married    Spouse name: Todd [name]  . Number of children: 1  . Years of education: HS  . Highest education level: Not on file  Occupational History  . Occupation: [employer]  Tobacco Use  . Smoking status: Never Smoker  . Smokeless tobacco: Never Used  Vaping Use  . Vaping Use: Never used  Substance and Sexual Activity  . Alcohol use: Yes    Alcohol/week: 1.0 standard drink    Types: 1 Glasses of wine per week    Comment: Rare occasion  . Drug use: No  . Sexual activity: Yes  Other Topics Concern  . Not on file  Social History Narrative   Drinks 1 Mt Dew 2 days a week    Social Determinants of Corporate investment banker Strain: Not on file  Food Insecurity: Not on file  Transportation Needs: Not on file  Physical Activity: Not on file  Stress: Not on file  Social Connections: Not on file  Intimate Partner Violence: Not on file      PHYSICAL EXAM  Vitals:   03/18/21 0955  BP: 106/79  Pulse: (!) 57  Weight: 225 lb (102.1 kg)  Height: 5\' 6"  (1.676 m)   Body mass index is 36.32 kg/m.  Generalized: Well developed, in no acute distress  Chest: Lungs clear to auscultation bilaterally  Neurological examination  Mentation: Alert oriented to time, place, history taking. Follows all commands speech  and language fluent Cranial nerve II-XII: Extraocular movements were full, visual field were full on confrontational test Head turning and shoulder shrug  were normal and symmetric. Motor: The motor testing reveals 5 over 5 strength of all 4 extremities. Good symmetric motor tone is noted throughout.  Sensory: Sensory testing is intact to soft touch on all 4 extremities. No evidence of extinction is noted.  Gait and station: Gait is normal.    DIAGNOSTIC DATA (LABS, IMAGING, TESTING) - I reviewed patient records, labs, notes, testing and imaging myself where available.  Lab Results  Component  Value Date   WBC 5.8 01/27/2016   HGB 13.8 01/27/2016   HCT 41.7 01/27/2016   MCV 79.0 01/27/2016   PLT 182 01/27/2016      Component Value Date/Time   NA 140 01/27/2016 0840   K 4.2 01/27/2016 0840   CL 104 01/27/2016 0840   CO2 28 01/27/2016 0840   GLUCOSE 91 01/27/2016 0840   BUN 13 01/27/2016 0840   CREATININE 0.93 01/27/2016 0840   CALCIUM 8.8 01/27/2016 0840   PROT 6.6 12/07/2015 1330   ALBUMIN 3.7 12/07/2015 1330   AST 19 12/07/2015 1330   ALT 28 12/07/2015 1330   ALKPHOS 47 12/07/2015 1330   BILITOT 0.8 12/07/2015 1330   GFRNONAA >60 12/10/2015 0234   GFRAA >60 12/10/2015 0234   Lab Results  Component Value Date   CHOL 166 12/08/2015   HDL 37 (L) 12/08/2015   LDLCALC 111 (H) 12/08/2015   TRIG 89 12/08/2015   CHOLHDL 4.5 12/08/2015      ASSESSMENT AND PLAN 55 y.o. year old male  has a past medical history of Atypical chest pain, CHF (congestive heart failure) (HCC), Dizziness, GERD (gastroesophageal reflux disease), Gout, HLD (hyperlipidemia), Hypertension, IFG (impaired fasting glucose), Obesity, OSA on CPAP, PVC (premature ventricular contraction), and Shortness of breath dyspnea. here with:  1. OSA on CPAP  - CPAP compliance excellent - Good treatment of AHI  - Encourage patient to use CPAP nightly and > 4 hours each night - F/U in 1 year or sooner if  needed   Butch Penny, MSN, NP-C 03/18/2021, 10:09 AM [facility] [address], [address] [place], [place] [address] [phone]

## 2021-04-10 DIAGNOSIS — G4733 Obstructive sleep apnea (adult) (pediatric): Secondary | ICD-10-CM | POA: Diagnosis not present

## 2021-04-16 ENCOUNTER — Other Ambulatory Visit: Payer: Self-pay

## 2021-04-16 ENCOUNTER — Ambulatory Visit: Payer: BC Managed Care – PPO | Admitting: Cardiology

## 2021-04-16 ENCOUNTER — Encounter: Payer: Self-pay | Admitting: Cardiology

## 2021-04-16 VITALS — BP 122/71 | HR 72 | Temp 98.1°F | Resp 17 | Ht 66.0 in | Wt 225.0 lb

## 2021-04-16 DIAGNOSIS — I428 Other cardiomyopathies: Secondary | ICD-10-CM | POA: Diagnosis not present

## 2021-04-16 DIAGNOSIS — Z9889 Other specified postprocedural states: Secondary | ICD-10-CM | POA: Diagnosis not present

## 2021-04-16 DIAGNOSIS — Z79899 Other long term (current) drug therapy: Secondary | ICD-10-CM

## 2021-04-16 DIAGNOSIS — G4733 Obstructive sleep apnea (adult) (pediatric): Secondary | ICD-10-CM | POA: Diagnosis not present

## 2021-04-16 DIAGNOSIS — I10 Essential (primary) hypertension: Secondary | ICD-10-CM | POA: Diagnosis not present

## 2021-04-16 DIAGNOSIS — Z9989 Dependence on other enabling machines and devices: Secondary | ICD-10-CM

## 2021-04-16 DIAGNOSIS — Z8679 Personal history of other diseases of the circulatory system: Secondary | ICD-10-CM

## 2021-04-16 NOTE — Progress Notes (Signed)
Primary Physician/Referring:  Cleatis Polka., MD  Patient ID: Todd [name]., male    DOB: Oct 23, 1966, 55 y.o.   MRN: [id number]  Chief Complaint  Patient presents with  . Atrial Fibrillation  . Cardiomyopathy  . Hypertension    1 year  . Shortness of Breath   HPI:    Todd [name].  is a Caucasian 55 y.o. male with nonischemic dilated cardiomyopathy (echocardiogram on 04/29/2015 revealing LVEF 30%, abnormal nuclear stress test on 05/12/2015 revealing inferior wall scar with mild ischemia and LVEF 19%); coronary angiogram on 07/01/2015 revealing no significant coronary artery disease.    He has h/o PAF, underwent successful atrial fibrillation ablation on 02/05/2016. No recurrence and is on CPAP and compliant. His last echo on 01/29/19, 08/01/19 -  EF 45%.  Over the past 2 months he has noticed mild dyspnea on exertion.  No PND or orthopnea.  He denies any chest pain, palpitations, leg edema.  Past Medical History:  Diagnosis Date  . Arrhythmia    A. Fibrillation S/P ablation 3/9/201  . CHF (congestive heart failure) (HCC)   . GERD (gastroesophageal reflux disease)   . Gout   . HLD (hyperlipidemia)   . Hypertension   . Obesity   . OSA on CPAP   . PVC (premature ventricular contraction)   . Shortness of breath dyspnea    Past Surgical History:  Procedure Laterality Date  . CARDIAC CATHETERIZATION N/A 07/01/2015   Procedure: Left Heart Cath;  Surgeon: Yates Decamp, MD;  Location: [facility] INVASIVE CV LAB;  Service: Cardiovascular;  Laterality: N/A;  . CARPAL TUNNEL RELEASE    . ELECTROPHYSIOLOGIC STUDY N/A 02/05/2016   Procedure: Atrial Fibrillation Ablation;  Surgeon: Will [name], MD;  Location: [facility] INVASIVE CV LAB;  Service: Cardiovascular;  Laterality: N/A;  . NASAL SEPTOPLASTY W/ TURBINOPLASTY Bilateral 09/14/2016   Procedure: NASAL SEPTOPLASTY WITH BILATERAL  TURBINATE REDUCTION;  Surgeon: Newman Pies, MD;  Location: [facility];  Service: ENT;  Laterality:  Bilateral;   Family History  Problem Relation Age of Onset  . Seizures Mother   . Deep vein thrombosis Mother   . COPD Mother   . Heart failure Mother   . Lung cancer Mother   . Prostate cancer Father   . Hypertension Father   . Diabetes Father   . Stroke Paternal Grandfather   . Heart attack Neg Hx     Social History   Tobacco Use  . Smoking status: Never Smoker  . Smokeless tobacco: Never Used  Substance Use Topics  . Alcohol use: Yes    Alcohol/week: 1.0 standard drink    Types: 1 Glasses of wine per week    Comment: Rare occasion   Marital Status: Married  ROS  Review of Systems  Cardiovascular: Positive for dyspnea on exertion. Negative for leg swelling and syncope.  Respiratory: Positive for snoring (On CPAP and compliant). Negative for shortness of breath.   Gastrointestinal: Negative for melena.   Objective  Blood pressure 122/71, pulse 72, temperature 98.1 F (36.7 C), resp. rate 17, height 5\' 6"  (1.676 m), weight 225 lb (102.1 kg), SpO2 98 %.  Vitals with BMI 04/16/2021 03/18/2021 07/24/2020  Height 5\' 6"  5\' 6"  5\' 6"   Weight 225 lbs 225 lbs 227 lbs  BMI 36.33 36.33 36.66  Systolic 122 106 07/26/2020  Diastolic 71 79 70  Pulse 72 57 67     Physical Exam Constitutional:      General: He  is not in acute distress.    Appearance: He is well-developed.     Comments: Moderately built and moderately obese in no acute distress.   Cardiovascular:     Rate and Rhythm: Normal rate and regular rhythm.     Pulses: Intact distal pulses.     Heart sounds: No murmur heard. No gallop.      Comments: No leg edema, no JVD.  Pulmonary:     Effort: Pulmonary effort is normal. No accessory muscle usage.     Breath sounds: Normal breath sounds.  Abdominal:     General: Bowel sounds are normal.     Palpations: Abdomen is soft.    Laboratory examination:    External labs:   Cholesterol, total 168.000 m 11/06/2020 HDL 42.000 mg 11/06/2020 LDL 105.000 m  11/06/2020 Triglycerides 103.000 m 11/06/2020  A1C 5.200 % 11/06/2020  Hemoglobin 14.800 g/d 11/01/2019 Creatinine, Serum 0.900 mg/ 11/01/2019 Potassium 4.200 mEq 11/06/2020 Magnesium 1.800 MG/ 06/17/2016 ALT (SGPT) 28.000 IU/ 11/06/2020  TSH 1.140 11/06/2020   Lipid Panel completed 11/01/2019 Cholesterol, total 179.000 m 11/01/2019 Triglycerides 105.000 11/01/2019 HDL 39 MG/DL 37/06/5884 LDL 027.741 m 11/01/2019  Medications and allergies   Allergies  Allergen Reactions  . Metoprolol Other (See Comments)    Lethargy, didn't feel like doing anything (tolerates low dose carvedilol)  . Valsartan Other (See Comments)    Increased blood pressure  . Azithromycin Itching and Other (See Comments)    Tingling in fingers  . Entresto [Sacubitril-Valsartan] Rash and Other (See Comments)    Tingling in fingers/ redness & sensitivity at tips of fingers    Current Outpatient Medications on File Prior to Visit  Medication Sig Dispense Refill  . acetaminophen (TYLENOL) 500 MG tablet Take 1,000 mg by mouth daily as needed for headache.    . carvedilol (COREG) 25 MG tablet Take 0.5 tablets (12.5 mg total) by mouth 2 (two) times daily. 90 tablet 2  . Cholecalciferol (VITAMIN D) 2000 units tablet Take 2,000 Units by mouth daily.    [name] dofetilide (TIKOSYN) 500 MCG capsule TAKE 1 CAPSULE EVERY TWELVE HOURS. 180 capsule 3  . eplerenone (INSPRA) 25 MG tablet TAKE 1 TABLET BY MOUTH DAILY. 30 tablet 11  . olmesartan (BENICAR) 40 MG tablet Take 40 mg by mouth at bedtime.    [name] omeprazole (PRILOSEC) 20 MG capsule Take 20 mg by mouth as needed.     . rivaroxaban (XARELTO) 20 MG TABS tablet Take 20 mg by mouth daily.     No current facility-administered medications on file prior to visit.    Radiology:   No results found.  Cardiac Studies:   Coronary angiogram 07/01/2015: Normal coronary arteries. LVEF 25-35%.   Sleep study 07/17/2015: Successful CPAP titration for OSA. Follows Dr. [name].  AF Ablation  02/05/2016: Successful electrical isolation and anatomical encircling of all four pulmonary veins with radiofrequency current (Dr. Loman Brooklyn)  Echocardiogram 08/01/2019: Left ventricle cavity is normal in size. Moderate concentric hypertrophy of the left ventricle. Mild global hypokinesis with mildly depressed LV systolic function with EF 45%. Doppler evidence of grade I (impaired) diastolic dysfunction, normal LAP.  Mild (Grade I) mitral regurgitation. Inadequate TR jet to estimate pulmonary artery systolic pressure. Normal right atrial pressure. Mild dilatation of proximal ascending aorta 4.1 cm, which is slightly less than measurement seen on previous study dated 01/26/2019.    EKG  EKG 04/16/2021: Normal sinus rhythm at rate of 63 bpm, normal axis, incomplete right bundle branch block.  No evidence  of ischemia, normal EKG.   No significant change from EKG 04/16/2020   Assessment     ICD-10-CM   1. Nonischemic cardiomyopathy (HCC)  I42.8 PCV ECHOCARDIOGRAM COMPLETE    LONG TERM MONITOR (3-14 DAYS)  2. S/P ablation of atrial fibrillation 3/9/201  Z98.890 LONG TERM MONITOR (3-14 DAYS)   Z86.79   3. Essential hypertension  I10 EKG 12-Lead  4. OSA on CPAP  G47.33    Z99.89   5. High risk medication use  Z79.899      No orders of the defined types were placed in this encounter.   Medications Discontinued During This Encounter  Medication Reason  . clotrimazole-betamethasone (LOTRISONE) cream Error  . predniSONE (DELTASONE) 20 MG tablet Error    Recommendations:   Galvin Aversa.  is a Caucasian 55 y.o. male with nonischemic dilated cardiomyopathy (echocardiogram on 04/29/2015 revealing LVEF 30%, abnormal nuclear stress test on 05/12/2015 revealing inferior wall scar with mild ischemia and LVEF 19%); coronary angiogram on 07/01/2015 revealing no significant coronary artery disease.  Past medical history significant for hypertension, obstructive sleep apnea on CPAP, paroxysmal atrial  fibrillation SP A. fib ablation in March 2017.  Except for having noticed dyspnea on exertion over the past 2 months, no PND or orthopnea, no leg edema, no clinical evidence of heart failure today, no other specific symptoms today.  We will repeat echocardiogram to reevaluate his LV systolic function.  Suspect obesity and hypoventilation along with decreased exercise tolerance as etiology.  Do not suspect coronary disease as he has had coronary angiogram in 2016 revealing normal coronary arteries and there is no atherosclerosis noted on any of the CT scan of the neck or cardiac CT performed for pulmonary vein evaluation in 2018.  I'm beginning to wonder whether we should discontinue Xarelto as his cardioembolic risk is 4.0%.  If LVEF remains >40%, would consider discontinuing aspirin.  I will also perform a Zio patch for 2 weeks to see if he has any episodes of even brief atrial fibrillation to continue anticoagulation.  I have had extensive discussion with the patient regarding risks and benefits of being on anticoagulation and unpredictability about stroke risk overall in spite of CHA2DS2-VASc score being low.  Blood pressures well controlled, he has mild hyperlipidemia but not on therapy as he has no other cardiovascular factors and no atherosclerosis by imaging studies.  Unless echocardiogram is abnormal, I will see him back in a year.  We will obtain labs performed by his PCP as patient is on dofetilide.   Yates Decamp, MD, [facility] 04/16/2021, 9:09 AM Office: [phone] Fax: [phone] Pager: [phone]

## 2021-04-23 ENCOUNTER — Inpatient Hospital Stay: Payer: BC Managed Care – PPO

## 2021-04-23 ENCOUNTER — Other Ambulatory Visit: Payer: BC Managed Care – PPO

## 2021-04-23 DIAGNOSIS — I428 Other cardiomyopathies: Secondary | ICD-10-CM

## 2021-04-23 DIAGNOSIS — Z8679 Personal history of other diseases of the circulatory system: Secondary | ICD-10-CM

## 2021-04-28 ENCOUNTER — Other Ambulatory Visit: Payer: Self-pay

## 2021-04-28 ENCOUNTER — Ambulatory Visit: Payer: BC Managed Care – PPO

## 2021-04-28 DIAGNOSIS — I428 Other cardiomyopathies: Secondary | ICD-10-CM

## 2021-04-29 NOTE — Progress Notes (Signed)
Echocardiogram 04/28/2021: Left ventricle cavity is normal in size and wall thickness. Normal global wall motion. Normal LV systolic function with visual EF 50-55%. Normal diastolic filling pattern.  The aortic root is mildly dilated at 3.9 cm.  No evidence of pulmonary hypertension. Proximal ascending aorta not well visualized, noted 4.1 cm in 2020. EF slightly improved from 45-50%. MR not seen.

## 2021-05-11 DIAGNOSIS — G4733 Obstructive sleep apnea (adult) (pediatric): Secondary | ICD-10-CM | POA: Diagnosis not present

## 2021-05-12 ENCOUNTER — Inpatient Hospital Stay: Payer: BC Managed Care – PPO

## 2021-05-15 ENCOUNTER — Other Ambulatory Visit: Payer: Self-pay

## 2021-05-15 ENCOUNTER — Inpatient Hospital Stay: Payer: BC Managed Care – PPO

## 2021-05-18 ENCOUNTER — Telehealth: Payer: Self-pay

## 2021-05-18 NOTE — Telephone Encounter (Signed)
Pt called because his monitor fell off the same day we put it on. Pt was outside doing some yard work, he tried to keep it dry but showered after. Pt would like to wait a week to get another one put on. He said his week is to busy for him to have the monitor on. I explained to him that when we put it on he has to avoid showering for 24 hours and try to no do anything that would cause him to sweat for 24 hours as well.

## 2021-05-19 NOTE — Telephone Encounter (Signed)
I am good with waiting for a week or so.

## 2021-05-26 ENCOUNTER — Other Ambulatory Visit: Payer: Self-pay

## 2021-05-26 DIAGNOSIS — I428 Other cardiomyopathies: Secondary | ICD-10-CM

## 2021-05-26 NOTE — Telephone Encounter (Signed)
Called and spoke to patient, pt will be coming back in Thursday morning to get it re-placed.

## 2021-05-28 ENCOUNTER — Inpatient Hospital Stay: Payer: BC Managed Care – PPO

## 2021-05-28 DIAGNOSIS — I428 Other cardiomyopathies: Secondary | ICD-10-CM | POA: Diagnosis not present

## 2021-06-16 DIAGNOSIS — I428 Other cardiomyopathies: Secondary | ICD-10-CM | POA: Diagnosis not present

## 2021-06-26 DIAGNOSIS — H04123 Dry eye syndrome of bilateral lacrimal glands: Secondary | ICD-10-CM | POA: Diagnosis not present

## 2021-06-26 DIAGNOSIS — H52203 Unspecified astigmatism, bilateral: Secondary | ICD-10-CM | POA: Diagnosis not present

## 2021-06-26 DIAGNOSIS — H5213 Myopia, bilateral: Secondary | ICD-10-CM | POA: Diagnosis not present

## 2021-06-28 DIAGNOSIS — G4733 Obstructive sleep apnea (adult) (pediatric): Secondary | ICD-10-CM | POA: Diagnosis not present

## 2021-07-01 ENCOUNTER — Other Ambulatory Visit: Payer: Self-pay | Admitting: Cardiology

## 2021-07-01 DIAGNOSIS — I480 Paroxysmal atrial fibrillation: Secondary | ICD-10-CM

## 2021-07-01 NOTE — Telephone Encounter (Signed)
Refill request

## 2021-07-16 DIAGNOSIS — R058 Other specified cough: Secondary | ICD-10-CM | POA: Diagnosis not present

## 2021-07-16 DIAGNOSIS — U071 COVID-19: Secondary | ICD-10-CM | POA: Diagnosis not present

## 2021-07-21 ENCOUNTER — Other Ambulatory Visit: Payer: Self-pay | Admitting: Cardiology

## 2021-07-28 DIAGNOSIS — G4733 Obstructive sleep apnea (adult) (pediatric): Secondary | ICD-10-CM | POA: Diagnosis not present

## 2021-08-27 DIAGNOSIS — G4733 Obstructive sleep apnea (adult) (pediatric): Secondary | ICD-10-CM | POA: Diagnosis not present

## 2021-09-03 DIAGNOSIS — R350 Frequency of micturition: Secondary | ICD-10-CM | POA: Diagnosis not present

## 2021-09-09 ENCOUNTER — Telehealth: Payer: Self-pay | Admitting: Cardiology

## 2021-09-09 ENCOUNTER — Other Ambulatory Visit: Payer: Self-pay | Admitting: Cardiology

## 2021-09-09 MED ORDER — CARVEDILOL 25 MG PO TABS
12.5000 mg | ORAL_TABLET | Freq: Two times a day (BID) | ORAL | 0 refills | Status: DC
Start: 2021-09-09 — End: 2021-10-08

## 2021-09-09 NOTE — Telephone Encounter (Signed)
*  STAT* If patient is at the pharmacy, call can be transferred to refill team.   1. Which medications need to be refilled? (please list name of each medication and dose if known) carvedilol (COREG) 25 MG tablet   2. Which pharmacy/location (including street and city if local pharmacy) is medication to be sent to? [facility] [place], [place] - [address]  3. Do they need a 30 day or 90 day supply? 90 day  Patient is completely out of medication and will be going out of town tomorrow morning.

## 2021-09-23 ENCOUNTER — Ambulatory Visit: Payer: BC Managed Care – PPO | Admitting: Student

## 2021-09-26 DIAGNOSIS — G4733 Obstructive sleep apnea (adult) (pediatric): Secondary | ICD-10-CM | POA: Diagnosis not present

## 2021-10-06 NOTE — Progress Notes (Signed)
PCP:  Cleatis Polka., MD Primary Cardiologist: None Electrophysiologist: Will [name], MD   Todd [name]. is a 55 y.o. male seen today for Will [name], MD for routine electrophysiology followup.  Since last being seen in our clinic the patient reports doing very well. Has rare palpitations that are short and not limiting. he denies chest pain, dyspnea, PND, orthopnea, nausea, vomiting, dizziness, syncope, edema, weight gain, or early satiety.  Past Medical History:  Diagnosis Date   Arrhythmia    A. Fibrillation S/P ablation 3/9/201   CHF (congestive heart failure) (HCC)    GERD (gastroesophageal reflux disease)    Gout    HLD (hyperlipidemia)    Hypertension    Obesity    OSA on CPAP    PVC (premature ventricular contraction)    Shortness of breath dyspnea    Past Surgical History:  Procedure Laterality Date   CARDIAC CATHETERIZATION N/A 07/01/2015   Procedure: Left Heart Cath;  Surgeon: Yates Decamp, MD;  Location: [facility] INVASIVE CV LAB;  Service: Cardiovascular;  Laterality: N/A;   CARPAL TUNNEL RELEASE     ELECTROPHYSIOLOGIC STUDY N/A 02/05/2016   Procedure: Atrial Fibrillation Ablation;  Surgeon: Will [name], MD;  Location: [facility] INVASIVE CV LAB;  Service: Cardiovascular;  Laterality: N/A;   NASAL SEPTOPLASTY W/ TURBINOPLASTY Bilateral 09/14/2016   Procedure: NASAL SEPTOPLASTY WITH BILATERAL  TURBINATE REDUCTION;  Surgeon: Newman Pies, MD;  Location: [facility];  Service: ENT;  Laterality: Bilateral;    Current Outpatient Medications  Medication Sig Dispense Refill   acetaminophen (TYLENOL) 500 MG tablet Take 1,000 mg by mouth daily as needed for headache.     Cholecalciferol (VITAMIN D) 2000 units tablet Take 2,000 Units by mouth daily.     dofetilide (TIKOSYN) 500 MCG capsule TAKE 1 CAPSULE EVERY TWELVE HOURS. 180 capsule 3   eplerenone (INSPRA) 25 MG tablet TAKE 1 TABLET BY MOUTH DAILY. 30 tablet 11   olmesartan (BENICAR) 40 MG tablet  Take 40 mg by mouth at bedtime.     omeprazole (PRILOSEC) 20 MG capsule Take 20 mg by mouth as needed.      rivaroxaban (XARELTO) 20 MG TABS tablet Take 20 mg by mouth daily.     carvedilol (COREG) 25 MG tablet Take 0.5 tablets (12.5 mg total) by mouth 2 (two) times daily with a meal. 90 tablet 3   No current facility-administered medications for this visit.    Allergies  Allergen Reactions   Metoprolol Other (See Comments)    Lethargy, didn't feel like doing anything (tolerates low dose carvedilol)   Valsartan Other (See Comments)    Increased blood pressure   Azithromycin Itching and Other (See Comments)    Tingling in fingers   Entresto [Sacubitril-Valsartan] Rash and Other (See Comments)    Tingling in fingers/ redness & sensitivity at tips of fingers    Social History   Socioeconomic History   Marital status: Married    Spouse name: Tonia   Number of children: 1   Years of education: HS   Highest education level: Not on file  Occupational History   Occupation: [employer]  Tobacco Use   Smoking status: Never   Smokeless tobacco: Never  Vaping Use   Vaping Use: Never used  Substance and Sexual Activity   Alcohol use: Yes    Alcohol/week: 1.0 standard drink    Types: 1 Glasses of wine per week    Comment: Rare occasion   Drug use:  No   Sexual activity: Yes  Other Topics Concern   Not on file  Social History Narrative   Drinks 1 Mt Dew 2 days a week    Social Determinants of Radio broadcast assistant Strain: Not on file  Food Insecurity: Not on file  Transportation Needs: Not on file  Physical Activity: Not on file  Stress: Not on file  Social Connections: Not on file  Intimate Partner Violence: Not on file     Review of Systems: All other systems reviewed and are otherwise negative except as noted above.  Physical Exam: Vitals:   10/08/21 0855  BP: 112/72  Pulse: 70  SpO2: 97%  Weight: 225 lb (102.1 kg)  Height: 5\' 6"  (1.676 m)    GEN- The patient  is well appearing, alert and oriented x 3 today.   HEENT: normocephalic, atraumatic; sclera clear, conjunctiva pink; hearing intact; oropharynx clear; neck supple, no JVP Lymph- no cervical lymphadenopathy Lungs- Clear to ausculation bilaterally, normal work of breathing.  No wheezes, rales, rhonchi Heart- Regular rate and rhythm, no murmurs, rubs or gallops, PMI not laterally displaced GI- soft, non-tender, non-distended, bowel sounds present, no hepatosplenomegaly Extremities- no clubbing, cyanosis, or edema; DP/PT/radial pulses 2+ bilaterally MS- no significant deformity or atrophy Skin- warm and dry, no rash or lesion Psych- euthymic mood, full affect Neuro- strength and sensation are intact  EKG is ordered. Personal review shows NSR at 65 with stable QTc and incomplete RBBB at 102 ms   Additional studies reviewed include: Previous EP office notes.   Assessment and Plan:  1.  Persistent atrial fibrillation s/p ablation 01/2016 Continue Xarelto for CHA2DS2-VASc of at least 2.  Continue coreg 12.5 mg BID.  (He does not tolerate the formulation of the 12.5 tablets, so splits a 25 mg) EKG stable on tikosyn 500 mcg BID Labs today.    2. HTN Stable on current regimen    3.  Chronic systolic heart failure:  EF normalized most ablation. Stable most recent echo 04/28/21 LVEF 55-60%    5. OSA Encouraged daily CPAP   5.  PVCs:  By previous EKGs appear to be outflow tract related.   Asymptomatic. Follow on BB.   Follow up with Dr. Curt Bears in 12 months   Shirley Friar, [place]  10/08/21 9:18 AM

## 2021-10-08 ENCOUNTER — Ambulatory Visit (INDEPENDENT_AMBULATORY_CARE_PROVIDER_SITE_OTHER): Payer: BC Managed Care – PPO | Admitting: Student

## 2021-10-08 ENCOUNTER — Other Ambulatory Visit: Payer: Self-pay

## 2021-10-08 ENCOUNTER — Encounter: Payer: Self-pay | Admitting: Student

## 2021-10-08 VITALS — BP 112/72 | HR 70 | Ht 66.0 in | Wt 225.0 lb

## 2021-10-08 DIAGNOSIS — I480 Paroxysmal atrial fibrillation: Secondary | ICD-10-CM

## 2021-10-08 DIAGNOSIS — Z9989 Dependence on other enabling machines and devices: Secondary | ICD-10-CM

## 2021-10-08 DIAGNOSIS — G4733 Obstructive sleep apnea (adult) (pediatric): Secondary | ICD-10-CM

## 2021-10-08 DIAGNOSIS — I428 Other cardiomyopathies: Secondary | ICD-10-CM

## 2021-10-08 DIAGNOSIS — I10 Essential (primary) hypertension: Secondary | ICD-10-CM | POA: Diagnosis not present

## 2021-10-08 LAB — BASIC METABOLIC PANEL
BUN/Creatinine Ratio: 14 (ref 9–20)
BUN: 14 mg/dL (ref 6–24)
CO2: 27 mmol/L (ref 20–29)
Calcium: 9 mg/dL (ref 8.7–10.2)
Chloride: 102 mmol/L (ref 96–106)
Creatinine, Ser: 1.02 mg/dL (ref 0.76–1.27)
Glucose: 84 mg/dL (ref 70–99)
Potassium: 4.2 mmol/L (ref 3.5–5.2)
Sodium: 140 mmol/L (ref 134–144)
eGFR: 87 mL/min/{1.73_m2} (ref >59)

## 2021-10-08 LAB — MAGNESIUM: Magnesium: 2.2 mg/dL (ref 1.6–2.3)

## 2021-10-08 MED ORDER — CARVEDILOL 25 MG PO TABS: 12.5000 mg | ORAL_TABLET | Freq: Two times a day (BID) | ORAL | 3 refills | Status: DC

## 2021-10-08 NOTE — Patient Instructions (Signed)
Medication Instructions:  Your physician recommends that you continue on your current medications as directed. Please refer to the Current Medication list given to you today.  *If you need a refill on your cardiac medications before your next appointment, please call your pharmacy*   Lab Work: TODAY: BMET, Mg  If you have labs (blood work) drawn today and your tests are completely normal, you will receive your results only by: MyChart Message (if you have MyChart) OR A paper copy in the mail If you have any lab test that is abnormal or we need to change your treatment, we will call you to review the results.   Follow-Up: At [facility], you and your health needs are our priority.  As part of our continuing mission to provide you with exceptional heart care, we have created designated Provider Care Teams.  These Care Teams include your primary Cardiologist (physician) and Advanced Practice Providers (APPs -  Physician Assistants and Nurse Practitioners) who all work together to provide you with the care you need, when you need it.  We recommend signing up for the patient portal called "MyChart".  Sign up information is provided on this After Visit Summary.  MyChart is used to connect with patients for Virtual Visits (Telemedicine).  Patients are able to view lab/test results, encounter notes, upcoming appointments, etc.  Non-urgent messages can be sent to your provider as well.   To learn more about what you can do with MyChart, go to [email or web address].    Your next appointment:   1 year(s)  The format for your next appointment:   In Person  Provider:   Loman Brooklyn, MD

## 2021-10-26 DIAGNOSIS — G4733 Obstructive sleep apnea (adult) (pediatric): Secondary | ICD-10-CM | POA: Diagnosis not present

## 2021-11-25 DIAGNOSIS — Z125 Encounter for screening for malignant neoplasm of prostate: Secondary | ICD-10-CM | POA: Diagnosis not present

## 2021-11-25 DIAGNOSIS — G4733 Obstructive sleep apnea (adult) (pediatric): Secondary | ICD-10-CM | POA: Diagnosis not present

## 2021-11-25 DIAGNOSIS — E559 Vitamin D deficiency, unspecified: Secondary | ICD-10-CM | POA: Diagnosis not present

## 2021-11-25 DIAGNOSIS — E785 Hyperlipidemia, unspecified: Secondary | ICD-10-CM | POA: Diagnosis not present

## 2021-11-26 DIAGNOSIS — E785 Hyperlipidemia, unspecified: Secondary | ICD-10-CM | POA: Diagnosis not present

## 2021-12-02 DIAGNOSIS — R7301 Impaired fasting glucose: Secondary | ICD-10-CM | POA: Diagnosis not present

## 2021-12-02 DIAGNOSIS — Z23 Encounter for immunization: Secondary | ICD-10-CM | POA: Diagnosis not present

## 2021-12-02 DIAGNOSIS — Z1339 Encounter for screening examination for other mental health and behavioral disorders: Secondary | ICD-10-CM | POA: Diagnosis not present

## 2021-12-02 DIAGNOSIS — I10 Essential (primary) hypertension: Secondary | ICD-10-CM | POA: Diagnosis not present

## 2021-12-02 DIAGNOSIS — Z1331 Encounter for screening for depression: Secondary | ICD-10-CM | POA: Diagnosis not present

## 2021-12-02 DIAGNOSIS — Z0000 Encounter for general adult medical examination without abnormal findings: Secondary | ICD-10-CM | POA: Diagnosis not present

## 2021-12-26 DIAGNOSIS — G4733 Obstructive sleep apnea (adult) (pediatric): Secondary | ICD-10-CM | POA: Diagnosis not present

## 2022-01-25 DIAGNOSIS — G4733 Obstructive sleep apnea (adult) (pediatric): Secondary | ICD-10-CM | POA: Diagnosis not present

## 2022-02-24 DIAGNOSIS — G4733 Obstructive sleep apnea (adult) (pediatric): Secondary | ICD-10-CM | POA: Diagnosis not present

## 2022-03-12 ENCOUNTER — Other Ambulatory Visit: Payer: Self-pay

## 2022-03-12 ENCOUNTER — Telehealth: Payer: Self-pay | Admitting: Cardiology

## 2022-03-12 DIAGNOSIS — I480 Paroxysmal atrial fibrillation: Secondary | ICD-10-CM

## 2022-03-12 MED ORDER — EPLERENONE 25 MG PO TABS: 25.0000 mg | ORAL_TABLET | Freq: Every day | ORAL | 11 refills | Status: DC

## 2022-03-12 MED ORDER — RIVAROXABAN 20 MG PO TABS: 20.0000 mg | ORAL_TABLET | Freq: Every day | ORAL | 3 refills | Status: DC

## 2022-03-12 NOTE — Telephone Encounter (Signed)
Pt requesting that we do a pharmacy change for 2 of his medications. Both of these meds also have to be sent in as a 90 day supply for ins to cover them. ? ?eplerenone (INSPRA) 25 MG tablet ? ?rivaroxaban (XARELTO) 20 MG TABS tablet ? ?Send both to  ?[facility] [address] [place]  [id number] ?

## 2022-03-18 ENCOUNTER — Ambulatory Visit (INDEPENDENT_AMBULATORY_CARE_PROVIDER_SITE_OTHER): Payer: BC Managed Care – PPO | Admitting: Adult Health

## 2022-03-18 ENCOUNTER — Encounter: Payer: Self-pay | Admitting: Adult Health

## 2022-03-18 VITALS — BP 108/75 | HR 40 | Ht 66.0 in | Wt 214.0 lb

## 2022-03-18 DIAGNOSIS — Z9989 Dependence on other enabling machines and devices: Secondary | ICD-10-CM

## 2022-03-18 DIAGNOSIS — G4733 Obstructive sleep apnea (adult) (pediatric): Secondary | ICD-10-CM

## 2022-03-18 NOTE — Progress Notes (Signed)
? ? ?PATIENT: Todd [name]. ?DOB: 1966-10-27 ? ?REASON FOR VISIT: follow up ?HISTORY FROM: patient ? ?Chief Complaint  ?Patient presents with  ? Follow-up  ?  Pt in 18  pt is here for CPAP follow up . Pt states at times he feels he doesn't get enough rest at night pt states at times he is fatigue.   ? ? ? ? ?HISTORY OF PRESENT ILLNESS: ?Today 03/18/22: ? ?Todd [name] is a 56 year old male with a history of obstructive sleep apnea on CPAP.  He returns today for follow-up. Reports that sometimes he still feels not as rested.  ?Works 12 hour shift on the weekend- night shift. Sleeps normal hours during the week.  His download is below ? ? ? ? ? ? ?03/18/21:  ?Todd [name] is a 56 year old male with a history of obstructive sleep apnea on CPAP.  He returns today for follow-up.  He reports that the CPAP is working well for him.  He denies any new issues.  He returns today for an evaluation. ? ? ? ?02/16/21: Todd [name] is a 56 year old male with a history of obstructive sleep apnea on CPAP.  His download indicates that he use his machine nightly for compliance of 100%.  He uses machine greater than 4 hours 29 out of 30 days for compliance of 97%.  On average he uses his machine 6 hours and 26 minutes.  His residual AHI is 0.9 on 16.4 cm of water with EPR of 2.  Leak in the 95th percentile is 14.6 L/[name].  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 ? ?HISTORY 1/2/2020CM Todd [name] 56 year old male returns for follow-up with history of obstructive sleep apnea here for CPAP compliance.  He reports he has not been sleeping well the last couple of weeks.  His wife fell and suffered a subdural hematoma.  He has been anxious over this.  He continues to work 12-hour night shifts.  Compliance data dated 10/30/2018-11/28/2018 shows compliance greater than 4 hours at 100%.  Average usage 6 hours 14 minutes.  Set pressure 17.6 cm AHI 1.6.  He returns for reevaluation ? ?REVIEW OF SYSTEMS: Out of a complete  14 system review of symptoms, the patient complains only of the following symptoms, and all other reviewed systems are negative. ? ?FSS 24 ?ESS 10 ? ?ALLERGIES: ?Allergies  ?Allergen Reactions  ? Metoprolol Other (See Comments)  ?  Lethargy, didn't feel like doing anything (tolerates low dose carvedilol)  ? Valsartan Other (See Comments)  ?  Increased blood pressure  ? Azithromycin Itching and Other (See Comments)  ?  Tingling in fingers  ? Entresto [Sacubitril-Valsartan] Rash and Other (See Comments)  ?  Tingling in fingers/ redness & sensitivity at tips of fingers  ? ? ?HOME MEDICATIONS: ?Outpatient Medications Prior to Visit  ?Medication Sig Dispense Refill  ? acetaminophen (TYLENOL) 500 MG tablet Take 1,000 mg by mouth daily as needed for headache.    ? carvedilol (COREG) 25 MG tablet Take 0.5 tablets (12.5 mg total) by mouth 2 (two) times daily with a meal. 90 tablet 3  ? Cholecalciferol (VITAMIN D) 2000 units tablet Take 2,000 Units by mouth daily.    ? dofetilide (TIKOSYN) 500 MCG capsule TAKE 1 CAPSULE EVERY TWELVE HOURS. 180 capsule 3  ? eplerenone (INSPRA) 25 MG tablet Take 1 tablet (25 mg total) by mouth daily. 30 tablet 11  ? olmesartan (BENICAR) 40 MG tablet Take 40 mg by mouth at bedtime.    ?  omeprazole (PRILOSEC) 20 MG capsule Take 20 mg by mouth as needed.     ? rivaroxaban (XARELTO) 20 MG TABS tablet Take 1 tablet (20 mg total) by mouth daily. 30 tablet 3  ? ?No facility-administered medications prior to visit.  ? ? ?PAST MEDICAL HISTORY: ?Past Medical History:  ?Diagnosis Date  ? Arrhythmia   ? A. Fibrillation S/P ablation 3/9/201  ? CHF (congestive heart failure) (HCC)   ? GERD (gastroesophageal reflux disease)   ? Gout   ? HLD (hyperlipidemia)   ? Hypertension   ? Obesity   ? OSA on CPAP   ? PVC (premature ventricular contraction)   ? Shortness of breath dyspnea   ? ? ?PAST SURGICAL HISTORY: ?Past Surgical History:  ?Procedure Laterality Date  ? CARDIAC CATHETERIZATION N/A 07/01/2015  ? Procedure:  Left Heart Cath;  Surgeon: Yates Decamp, MD;  Location: [facility] INVASIVE CV LAB;  Service: Cardiovascular;  Laterality: N/A;  ? CARPAL TUNNEL RELEASE    ? ELECTROPHYSIOLOGIC STUDY N/A 02/05/2016  ? Procedure: Atrial Fibrillation Ablation;  Surgeon: Will [name], MD;  Location: [facility] INVASIVE CV LAB;  Service: Cardiovascular;  Laterality: N/A;  ? NASAL SEPTOPLASTY W/ TURBINOPLASTY Bilateral 09/14/2016  ? Procedure: NASAL SEPTOPLASTY WITH BILATERAL  TURBINATE REDUCTION;  Surgeon: Newman Pies, MD;  Location: [facility];  Service: ENT;  Laterality: Bilateral;  ? ? ?FAMILY HISTORY: ?Family History  ?Problem Relation Age of Onset  ? Seizures Mother   ? Deep vein thrombosis Mother   ? COPD Mother   ? Heart failure Mother   ? Lung cancer Mother   ? Prostate cancer Father   ? Hypertension Father   ? Diabetes Father   ? Stroke Paternal Grandfather   ? Heart attack Neg Hx   ? Sleep apnea Neg Hx   ? ? ?SOCIAL HISTORY: ?Social History  ? ?Socioeconomic History  ? Marital status: Married  ?  Spouse name: Dorisann Frames  ? Number of children: 1  ? Years of education: HS  ? Highest education level: Not on file  ?Occupational History  ? Occupation: [employer]  ?Tobacco Use  ? Smoking status: Never  ? Smokeless tobacco: Never  ?Vaping Use  ? Vaping Use: Never used  ?Substance and Sexual Activity  ? Alcohol use: Yes  ?  Alcohol/week: 1.0 standard drink  ?  Types: 1 Glasses of wine per week  ?  Comment: Rare occasion  ? Drug use: No  ? Sexual activity: Yes  ?Other Topics Concern  ? Not on file  ?Social History Narrative  ? Drinks 1 Mt Dew 2 days a week   ? ?Social Determinants of Health  ? ?Financial Resource Strain: Not on file  ?Food Insecurity: Not on file  ?Transportation Needs: Not on file  ?Physical Activity: Not on file  ?Stress: Not on file  ?Social Connections: Not on file  ?Intimate Partner Violence: Not on file  ? ? ? ? ?PHYSICAL EXAM ? ?Vitals:  ? 03/18/22 0920  ?BP: 108/75  ?Pulse: (!) 40  ?Weight: 214 lb (97.1 kg)  ?Height: 5\' 6"   (1.676 m)  ? ?Body mass index is 34.54 kg/m?. ? ?Generalized: Well developed, in no acute distress  ?Chest: Lungs clear to auscultation bilaterally ? ?Neurological examination  ?Mentation: Alert oriented to time, place, history taking. Follows all commands speech and language fluent ?Cranial nerve II-XII: Head turning and shoulder shrug  were normal and symmetric. ?Motor: The motor testing reveals 5 over 5 strength of all 4 extremities.  Good symmetric motor tone is noted throughout.  ?Gait and station: Gait is normal.  ? ? ?DIAGNOSTIC DATA (LABS, IMAGING, TESTING) ?- I reviewed patient records, labs, notes, testing and imaging myself where available. ? ?Lab Results  ?Component Value Date  ? WBC 5.8 01/27/2016  ? HGB 13.8 01/27/2016  ? HCT 41.7 01/27/2016  ? MCV 79.0 01/27/2016  ? PLT 182 01/27/2016  ? ?   ?Component Value Date/Time  ? NA 140 10/08/2021 0919  ? K 4.2 10/08/2021 0919  ? CL 102 10/08/2021 0919  ? CO2 27 10/08/2021 0919  ? GLUCOSE 84 10/08/2021 0919  ? GLUCOSE 91 01/27/2016 0840  ? BUN 14 10/08/2021 0919  ? CREATININE 1.02 10/08/2021 0919  ? CREATININE 0.93 01/27/2016 0840  ? CALCIUM 9.0 10/08/2021 0919  ? PROT 6.6 12/07/2015 1330  ? ALBUMIN 3.7 12/07/2015 1330  ? AST 19 12/07/2015 1330  ? ALT 28 12/07/2015 1330  ? ALKPHOS 47 12/07/2015 1330  ? BILITOT 0.8 12/07/2015 1330  ? GFRNONAA >60 12/10/2015 0234  ? GFRAA >60 12/10/2015 0234  ? ?Lab Results  ?Component Value Date  ? CHOL 166 12/08/2015  ? HDL 37 (L) 12/08/2015  ? LDLCALC 111 (H) 12/08/2015  ? TRIG 89 12/08/2015  ? CHOLHDL 4.5 12/08/2015  ? ? ? ? ?ASSESSMENT AND PLAN ?56 y.o. year old male  has a past medical history of Arrhythmia, CHF (congestive heart failure) (HCC), GERD (gastroesophageal reflux disease), Gout, HLD (hyperlipidemia), Hypertension, Obesity, OSA on CPAP, PVC (premature ventricular contraction), and Shortness of breath dyspnea. here with: ? ?OSA on CPAP ? 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  He states when his daughter gets older he may consider a d

## 2022-03-18 NOTE — Patient Instructions (Signed)
Continue using CPAP nightly and greater than 4 hours each night °If your symptoms worsen or you develop new symptoms please let us know.  ° °

## 2022-03-26 DIAGNOSIS — G4733 Obstructive sleep apnea (adult) (pediatric): Secondary | ICD-10-CM | POA: Diagnosis not present

## 2022-04-13 DIAGNOSIS — I10 Essential (primary) hypertension: Secondary | ICD-10-CM | POA: Diagnosis not present

## 2022-04-14 ENCOUNTER — Other Ambulatory Visit: Payer: Self-pay | Admitting: Family Medicine

## 2022-04-14 ENCOUNTER — Ambulatory Visit
Admission: RE | Admit: 2022-04-14 | Discharge: 2022-04-14 | Disposition: A | Discharge status: Home / Self Care | Payer: BC Managed Care – PPO | Source: Ambulatory Visit | Attending: Family Medicine | Admitting: Family Medicine

## 2022-04-14 DIAGNOSIS — S0990XA Unspecified injury of head, initial encounter: Secondary | ICD-10-CM | POA: Diagnosis not present

## 2022-04-14 DIAGNOSIS — R519 Headache, unspecified: Secondary | ICD-10-CM | POA: Diagnosis not present

## 2022-04-16 ENCOUNTER — Encounter: Payer: Self-pay | Admitting: Cardiology

## 2022-04-16 ENCOUNTER — Ambulatory Visit: Payer: BC Managed Care – PPO | Admitting: Cardiology

## 2022-04-16 VITALS — BP 122/82 | HR 73 | Temp 98.7°F | Resp 16 | Ht 66.0 in | Wt 217.2 lb

## 2022-04-16 DIAGNOSIS — I428 Other cardiomyopathies: Secondary | ICD-10-CM | POA: Diagnosis not present

## 2022-04-16 DIAGNOSIS — I10 Essential (primary) hypertension: Secondary | ICD-10-CM | POA: Diagnosis not present

## 2022-04-16 DIAGNOSIS — I480 Paroxysmal atrial fibrillation: Secondary | ICD-10-CM | POA: Diagnosis not present

## 2022-04-16 DIAGNOSIS — R001 Bradycardia, unspecified: Secondary | ICD-10-CM

## 2022-04-16 DIAGNOSIS — Z9889 Other specified postprocedural states: Secondary | ICD-10-CM

## 2022-04-16 MED ORDER — CARVEDILOL 3.125 MG PO TABS: 3.1250 mg | ORAL_TABLET | Freq: Two times a day (BID) | ORAL | 3 refills | Status: DC

## 2022-04-16 NOTE — Progress Notes (Signed)
Primary Physician/Referring:  Cleatis Polka., MD  Patient ID: Todd Paradise., male    DOB: [birth year], 55 y.o.   MRN: [id number]  Chief Complaint  Patient presents with   Congestive Heart Failure   Hypertension   Atrial Fibrillation   Follow-up   HPI:    Todd Biggs.  is a Caucasian 56 y.o. male with nonischemic dilated cardiomyopathy (echocardiogram on 04/29/2015 revealing LVEF 30%, abnormal nuclear stress test on 05/12/2015 revealing inferior wall scar with mild ischemia and LVEF 19%); coronary angiogram on 07/01/2015 revealing no significant coronary artery disease.  Patient underwent successful atrial fibrillation ablation on 02/05/2016, serial echocardiograms have revealed normalization of LVEF most recently 50-55% in 03/2021.  Patient presents for annual follow-up.  He also continues to follow with EP for management of atrial fibrillation.  At last office visit with EP in 09/2021 was advised to continue Tikosyn, carvedilol, and anticoagulation.  Patient's primary complaint today is general fatigue over the last several weeks.  On home blood pressure monitoring he has noticed his heart rate in the mid to high 40s on occasion.  Denies dizziness, syncope, near syncope.  Denies orthopnea, PND, leg edema, significant dyspnea.  Denies chest pain, palpitations.  Past Medical History:  Diagnosis Date   Arrhythmia    A. Fibrillation S/P ablation 3/9/201   CHF (congestive heart failure) (HCC)    GERD (gastroesophageal reflux disease)    Gout    HLD (hyperlipidemia)    Hypertension    Obesity    OSA on CPAP    PVC (premature ventricular contraction)    Shortness of breath dyspnea    Past Surgical History:  Procedure Laterality Date   CARDIAC CATHETERIZATION N/A 07/01/2015   Procedure: Left Heart Cath;  Surgeon: Yates Decamp, MD;  Location: [facility] INVASIVE CV LAB;  Service: Cardiovascular;  Laterality: N/A;   CARPAL TUNNEL RELEASE     ELECTROPHYSIOLOGIC STUDY N/A 02/05/2016    Procedure: Atrial Fibrillation Ablation;  Surgeon: Will [name], MD;  Location: [facility] INVASIVE CV LAB;  Service: Cardiovascular;  Laterality: N/A;   NASAL SEPTOPLASTY W/ TURBINOPLASTY Bilateral 09/14/2016   Procedure: NASAL SEPTOPLASTY WITH BILATERAL  TURBINATE REDUCTION;  Surgeon: Newman Pies, MD;  Location: [facility];  Service: ENT;  Laterality: Bilateral;   Family History  Problem Relation Age of Onset   Seizures Mother    Deep vein thrombosis Mother    COPD Mother    Heart failure Mother    Lung cancer Mother    Prostate cancer Father    Hypertension Father    Diabetes Father    Stroke Paternal Grandfather    Heart attack Neg Hx    Sleep apnea Neg Hx     Social History   Tobacco Use   Smoking status: Never   Smokeless tobacco: Never  Substance Use Topics   Alcohol use: Yes    Alcohol/week: 1.0 standard drink    Types: 1 Glasses of wine per week    Comment: Rare occasion  Marital Status: Married   ROS  Review of Systems  Constitutional: Positive for malaise/fatigue.  Cardiovascular:  Negative for chest pain, claudication, dyspnea on exertion, leg swelling, near-syncope, orthopnea, palpitations, paroxysmal nocturnal dyspnea and syncope.  Respiratory:  Positive for snoring (On CPAP and compliant).   Gastrointestinal:  Negative for melena.  Neurological:  Negative for dizziness.  Objective  Blood pressure 122/82, pulse 73, temperature 98.7 F (37.1 C), temperature source Temporal, resp. rate 16, height 5'  6" (1.676 m), weight 217 lb 3.2 oz (98.5 kg), SpO2 97 %.     04/16/2022   10:21 AM 03/18/2022    9:20 AM 10/08/2021    8:55 AM  Vitals with BMI  Height 5\' 6"  5\' 6"  5\' 6"   Weight 217 lbs 3 oz 214 lbs 225 lbs  BMI 35.07 34.56 36.33  Systolic 122 108  Diastolic 82 75 72  Pulse 73 40 70     Physical Exam Constitutional:      General: He is not in acute distress.    Appearance: He is well-developed.     Comments: Moderately built and moderately  obese in no acute distress.   Cardiovascular:     Rate and Rhythm: Regular rhythm. Bradycardia present.     Pulses: Intact distal pulses.     Heart sounds: No murmur heard.   No gallop.     Comments: No JVD Pulmonary:     Effort: Pulmonary effort is normal. No accessory muscle usage.     Breath sounds: Normal breath sounds.  Musculoskeletal:     Right lower leg: No edema.     Left lower leg: No edema.   Laboratory examination:   External labs:   Cholesterol, total 168.000 m 11/06/2020 HDL 42.000 mg 11/06/2020 LDL 105.000 m 11/06/2020 Triglycerides 103.000 m 11/06/2020  A1C 5.200 % 11/06/2020  Hemoglobin 14.800 g/d 11/01/2019 Creatinine, Serum 0.900 mg/ 11/01/2019 Potassium 4.200 mEq 11/06/2020 Magnesium 1.800 MG/ 06/17/2016 ALT (SGPT) 28.000 IU/ 11/06/2020  TSH 1.140 11/06/2020   Lipid Panel completed 11/01/2019 Cholesterol, total 179.000 m 11/01/2019 Triglycerides 105.000 11/01/2019 HDL 39 MG/DL 14/01/2019 LDL 14/01/2019 m 11/01/2019  Allergies   Allergies  Allergen Reactions   Metoprolol Other (See Comments)    Lethargy, didn't feel like doing anything (tolerates low dose carvedilol)   Valsartan Other (See Comments)    Increased blood pressure   Azithromycin Itching and Other (See Comments)    Tingling in fingers   Entresto [Sacubitril-Valsartan] Rash and Other (See Comments)    Tingling in fingers/ redness & sensitivity at tips of fingers    Medications Prior to Visit:   Outpatient Medications Prior to Visit  Medication Sig Dispense Refill   acetaminophen (TYLENOL) 500 MG tablet Take 1,000 mg by mouth daily as needed for headache.     Cholecalciferol (VITAMIN D) 2000 units tablet Take 2,000 Units by mouth daily.     colchicine 0.6 MG tablet Take 1 tablet by mouth as needed.     dofetilide (TIKOSYN) 500 MCG capsule TAKE 1 CAPSULE EVERY TWELVE HOURS. 180 capsule 3   eplerenone (INSPRA) 25 MG tablet Take 1 tablet (25 mg total) by mouth daily. 30 tablet 11   olmesartan  (BENICAR) 40 MG tablet Take 40 mg by mouth at bedtime.     omeprazole (PRILOSEC) 20 MG capsule Take 20 mg by mouth as needed.      rivaroxaban (XARELTO) 20 MG TABS tablet Take 1 tablet (20 mg total) by mouth daily. 30 tablet 3   carvedilol (COREG) 25 MG tablet Take 0.5 tablets (12.5 mg total) by mouth 2 (two) times daily with a meal. 90 tablet 3   No facility-administered medications prior to visit.   Final Medications at End of Visit    No outpatient medications have been marked as taking for the 04/16/22 encounter (Office Visit) with [phone], MD.    Radiology:   No results found.  Cardiac Studies:   Coronary angiogram 07/01/2015: Normal coronary arteries. LVEF 25-35%.  Sleep study 07/17/2015: Successful CPAP titration for OSA. Follows Dr. [name].  AF Ablation 02/05/2016: Successful electrical isolation and anatomical encircling of all four pulmonary veins with radiofrequency current (Dr. Loman Brooklyn)  Zio Patch Extended out patient EKG monitoring 2 days 7 hours starting 05/28/2021: Predominant rhythm is normal sinus rhythm.  Minimum heart rate 45, maximum heart rate 210 bpm. There were 2 ventricular tachycardia, NSVT 11 beats with a maximum rate of 210 bpm,  EKG = probably brief atrial tachycardia with aberrancy. Therefore atrial tachycardia episodes, longest 7 beats.  There was no atrial fibrillation. There were occasional atrial couplets and triplets and isolated PVCs and rare ventricular couplets (<1%-275 beats) and rare ventricular triplets (<1%, 20 beats). There were no patient triggered events.  Echocardiogram 04/28/2021: Left ventricle cavity is normal in size and wall thickness. Normal global wall motion. Normal LV systolic function with visual EF 50-55%. Normal diastolic filling pattern.  The aortic root is mildly dilated at 3.9 cm.  No evidence of pulmonary hypertension. Proximal ascending aorta not well visualized, noted 4.1 cm in 2020. EF slightly improved from 45-50%.  MR not seen.  EKG   04/16/2022: Marked sinus bradycardia at a rate of 48 bpm.  Normal axis.  Incomplete right bundle branch block.  No evidence of ischemia or underlying injury pattern.  Compared EKG 04/16/2021, now bradycardic.  Assessment     ICD-10-CM   1. Paroxysmal atrial fibrillation (HCC)  I48.0     2. Sinus bradycardia by electrocardiogram  R00.1     3. Essential hypertension  I10 EKG 12-Lead    4. Nonischemic cardiomyopathy (HCC)  I42.8     5. S/P ablation of atrial fibrillation 3/9/201  Z98.890    Z86.79      This patients CHA2DS2-VASc Score 2 and yearly risk of stroke 2.2%.   Meds ordered this encounter  Medications   carvedilol (COREG) 3.125 MG tablet    Sig: Take 1 tablet (3.125 mg total) by mouth 2 (two) times daily with a meal.    Dispense:  60 tablet    Refill:  3    This prescription was filled on 07/01/2021. Any refills authorized will be placed on file.    Medications Discontinued During This Encounter  Medication Reason   carvedilol (COREG) 25 MG tablet     Recommendations:   Todd Duerst.  is a Caucasian 56 y.o. male with nonischemic dilated cardiomyopathy (echocardiogram on 04/29/2015 revealing LVEF 30%, abnormal nuclear stress test on 05/12/2015 revealing inferior wall scar with mild ischemia and LVEF 19%); coronary angiogram on 07/01/2015 revealing no significant coronary artery disease.  Patient underwent successful atrial fibrillation ablation on 02/05/2016, serial echocardiograms have revealed normalization of LVEF most recently 50-55% in 03/2021.  Patient presents for annual follow-up.  He also continues to follow with EP for management of atrial fibrillation.  At last office visit with EP in 09/2021 was advised to continue Tikosyn, carvedilol, and anticoagulation.  Patient's primary concern today is general fatigue.  EKG reveals marked bradycardia.  Therefore shared decision was to reduce carvedilol from 12.5mg  to 3.15 mg p.o. twice daily.  He  continues to tolerate anticoagulation well bleeding diathesis, will continue Xarelto.  We will also continue Tikosyn per EP.  Blood pressure is well controlled. He does have mild hyperlipidemia but not on therapy as he has no other cardiovascular factors and no atherosclerosis by imaging studies.   Have requested labs from PCP for review.  Reviewed and discussed results of echocardiogram and Zio  patch monitoring, details above.  Follow up in 2 weeks, sooner if needed, for atrial fibrillation and bradycardia with repeat EKG.    Rayford Halsted, PA-C 04/16/2022, 11:02 AM Office: [phone]

## 2022-04-25 DIAGNOSIS — G4733 Obstructive sleep apnea (adult) (pediatric): Secondary | ICD-10-CM | POA: Diagnosis not present

## 2022-04-28 ENCOUNTER — Telehealth: Payer: Self-pay

## 2022-04-29 NOTE — Telephone Encounter (Signed)
Patient stated another doctor change the medication Dr. Clelia Croft his pcp because his bp was running low and he felt dizzy therefore Dr. Clelia Croft told him to cut it in half. I spoke to patient to continue monitoring and we will see him tomorrow

## 2022-04-29 NOTE — Progress Notes (Unsigned)
Primary Physician/Referring:  Ginger Organ., MD  Patient ID: Todd Pilgrim., male    DOB: 10/05/1966, 56 y.o.   MRN: [id number]  No chief complaint on file.  HPI:    Todd Smuck.  is a Caucasian 56 y.o. male with nonischemic dilated cardiomyopathy (echocardiogram on 04/29/2015 revealing LVEF 30%, abnormal nuclear stress test on 05/12/2015 revealing inferior wall scar with mild ischemia and LVEF 19%); coronary angiogram on 07/01/2015 revealing no significant coronary artery disease.  Patient underwent successful atrial fibrillation ablation on 02/05/2016, serial echocardiograms have revealed normalization of LVEF most recently 50-55% in 03/2021.  Patient was last seen in our office 04/16/2022 for annual visit.  At that time EKG revealed marked bradycardia therefore reduce carvedilol from 12.5mg  to 3.125 mg p.o. twice daily, no other changes were made.  Patient now presents for 2-week follow-up. ***  ***  Patient presents for annual follow-up.  He also continues to follow with EP for management of atrial fibrillation.  At last office visit with EP in 09/2021 was advised to continue Tikosyn, carvedilol, and anticoagulation.  Patient's primary complaint today is general fatigue over the last several weeks.  On home blood pressure monitoring he has noticed his heart rate in the mid to high 40s on occasion.  Denies dizziness, syncope, near syncope.  Denies orthopnea, PND, leg edema, significant dyspnea.  Denies chest pain, palpitations.  Past Medical History:  Diagnosis Date   Arrhythmia    A. Fibrillation S/P ablation 3/9/201   CHF (congestive heart failure) (HCC)    GERD (gastroesophageal reflux disease)    Gout    HLD (hyperlipidemia)    Hypertension    Obesity    OSA on CPAP    PVC (premature ventricular contraction)    Shortness of breath dyspnea    Past Surgical History:  Procedure Laterality Date   CARDIAC CATHETERIZATION N/A 07/01/2015   Procedure: Left Heart Cath;   Surgeon: Adrian Prows, MD;  Location: [place] [facility];  Service: Cardiovascular;  Laterality: N/A;   CARPAL TUNNEL RELEASE     ELECTROPHYSIOLOGIC STUDY N/A 02/05/2016   Procedure: Atrial Fibrillation Ablation;  Surgeon: Will Meredith Leeds, MD;  Location: [place] [facility];  Service: Cardiovascular;  Laterality: N/A;   NASAL SEPTOPLASTY W/ TURBINOPLASTY Bilateral 09/14/2016   Procedure: NASAL SEPTOPLASTY WITH BILATERAL  TURBINATE REDUCTION;  Surgeon: Leta Baptist, MD;  Location: [place];  Service: ENT;  Laterality: Bilateral;   Family History  Problem Relation Age of Onset   Seizures Mother    Deep vein thrombosis Mother    COPD Mother    Heart failure Mother    Lung cancer Mother    Prostate cancer Father    Hypertension Father    Diabetes Father    Stroke Paternal Grandfather    Heart attack Neg Hx    Sleep apnea Neg Hx     Social History   Tobacco Use   Smoking status: Never   Smokeless tobacco: Never  Substance Use Topics   Alcohol use: Yes    Alcohol/week: 1.0 standard drink    Types: 1 Glasses of wine per week    Comment: Rare occasion  Marital Status: Married   ROS  Review of Systems  Constitutional: Positive for malaise/fatigue.  Cardiovascular:  Negative for chest pain, claudication, dyspnea on exertion, leg swelling, near-syncope, orthopnea, palpitations, paroxysmal nocturnal dyspnea and syncope.  Respiratory:  Positive for snoring (On CPAP and compliant).   Gastrointestinal:  Negative  for melena.  Neurological:  Negative for dizziness.   Objective  There were no vitals taken for this visit.     04/16/2022   10:21 AM 03/18/2022    9:20 AM 10/08/2021    8:55 AM  Vitals with BMI  Height 5\' 6"  5\' 6"  5\' 6"   Weight 217 lbs 3 oz 214 lbs 225 lbs  BMI 35.07 Q000111Q Q000111Q  Systolic 123XX123 [id number]  Diastolic 82 75 72  Pulse 73 40 70     Physical Exam Constitutional:      General: He is not in acute distress.    Appearance: He is well-developed.      Comments: Moderately built and moderately obese in no acute distress.   Cardiovascular:     Rate and Rhythm: Regular rhythm. Bradycardia present.     Pulses: Intact distal pulses.     Heart sounds: No murmur heard.   No gallop.     Comments: No JVD Pulmonary:     Effort: Pulmonary effort is normal. No accessory muscle usage.     Breath sounds: Normal breath sounds.  Musculoskeletal:     Right lower leg: No edema.     Left lower leg: No edema.   Laboratory examination:   External labs:   Cholesterol, total 168.000 m 11/06/2020 HDL 42.000 mg 11/06/2020 LDL 105.000 m 11/06/2020 Triglycerides 103.000 m 11/06/2020  A1C 5.200 % 11/06/2020  Hemoglobin 14.800 g/d 11/01/2019 Creatinine, Serum 0.900 mg/ 11/01/2019 Potassium 4.200 mEq 11/06/2020 Magnesium 1.800 MG/ 06/17/2016 ALT (SGPT) 28.000 IU/ 11/06/2020  TSH 1.140 11/06/2020   Lipid Panel completed 11/01/2019 Cholesterol, total 179.000 m 11/01/2019 Triglycerides 105.000 11/01/2019 HDL 39 MG/DL 11/01/2019 LDL 119.000 m 11/01/2019  Allergies   Allergies  Allergen Reactions   Metoprolol Other (See Comments)    Lethargy, didn't feel like doing anything (tolerates low dose carvedilol)   Valsartan Other (See Comments)    Increased blood pressure   Azithromycin Itching and Other (See Comments)    Tingling in fingers   Entresto [Sacubitril-Valsartan] Rash and Other (See Comments)    Tingling in fingers/ redness & sensitivity at tips of fingers    Medications Prior to Visit:   Outpatient Medications Prior to Visit  Medication Sig Dispense Refill   acetaminophen (TYLENOL) 500 MG tablet Take 1,000 mg by mouth daily as needed for headache.     carvedilol (COREG) 3.125 MG tablet Take 1 tablet (3.125 mg total) by mouth 2 (two) times daily with a meal. 60 tablet 3   Cholecalciferol (VITAMIN D) 2000 units tablet Take 2,000 Units by mouth daily.     colchicine 0.6 MG tablet Take 1 tablet by mouth as needed.     dofetilide (TIKOSYN) 500 MCG  capsule TAKE 1 CAPSULE EVERY TWELVE HOURS. 180 capsule 3   eplerenone (INSPRA) 25 MG tablet Take 1 tablet (25 mg total) by mouth daily. 30 tablet 11   olmesartan (BENICAR) 40 MG tablet Take 40 mg by mouth at bedtime.     omeprazole (PRILOSEC) 20 MG capsule Take 20 mg by mouth as needed.      rivaroxaban (XARELTO) 20 MG TABS tablet Take 1 tablet (20 mg total) by mouth daily. 30 tablet 3   No facility-administered medications prior to visit.   Final Medications at End of Visit    No outpatient medications have been marked as taking for the 04/30/22 encounter (Appointment) with Rayetta Pigg, Dorsey Charette C, PA-C.    Radiology:   No results found.  Cardiac Studies:   Coronary  angiogram 07/01/2015: Normal coronary arteries. LVEF 25-35%.   Sleep study 07/17/2015: Successful CPAP titration for OSA. Follows Dr. Rexene Alberts.  AF Ablation 02/05/2016: Successful electrical isolation and anatomical encircling of all four pulmonary veins with radiofrequency current (Dr. Allegra Lai)  Zio Patch Extended out patient EKG monitoring 2 days 7 hours starting 05/28/2021: Predominant rhythm is normal sinus rhythm.  Minimum heart rate 45, maximum heart rate 210 bpm. There were 2 ventricular tachycardia, NSVT 11 beats with a maximum rate of 210 bpm,  EKG = probably brief atrial tachycardia with aberrancy. Therefore atrial tachycardia episodes, longest 7 beats.  There was no atrial fibrillation. There were occasional atrial couplets and triplets and isolated PVCs and rare ventricular couplets (<1%-275 beats) and rare ventricular triplets (<1%, 20 beats). There were no patient triggered events.  Echocardiogram 04/28/2021: Left ventricle cavity is normal in size and wall thickness. Normal global wall motion. Normal LV systolic function with visual EF 50-55%. Normal diastolic filling pattern.  The aortic root is mildly dilated at 3.9 cm.  No evidence of pulmonary hypertension. Proximal ascending aorta not well visualized, noted  4.1 cm in 2020. EF slightly improved from 45-50%. MR not seen.  EKG   04/16/2022: Marked sinus bradycardia at a rate of 48 bpm.  Normal axis.  Incomplete right bundle branch block.  No evidence of ischemia or underlying injury pattern.  Compared EKG 04/16/2021, now bradycardic.  Assessment   No diagnosis found.  This patients CHA2DS2-VASc Score 2 and yearly risk of stroke 2.2%.   No orders of the defined types were placed in this encounter.   There are no discontinued medications.   Recommendations:   Todd Huesman.  is a Caucasian 56 y.o. male with nonischemic dilated cardiomyopathy (echocardiogram on 04/29/2015 revealing LVEF 30%, abnormal nuclear stress test on 05/12/2015 revealing inferior wall scar with mild ischemia and LVEF 19%); coronary angiogram on 07/01/2015 revealing no significant coronary artery disease.  Patient underwent successful atrial fibrillation ablation on 02/05/2016, serial echocardiograms have revealed normalization of LVEF most recently 50-55% in 03/2021.  Patient was last seen in our office 04/16/2022 for annual visit.  At that time EKG revealed marked bradycardia therefore reduce carvedilol from 12.5mg  to 3.125 mg p.o. twice daily, no other changes were made.  Patient now presents for 2-week follow-up. ***  ***  Patient presents for annual follow-up.  He also continues to follow with EP for management of atrial fibrillation.  At last office visit with EP in 09/2021 was advised to continue Tikosyn, carvedilol, and anticoagulation.  Patient's primary concern today is general fatigue.  EKG reveals marked bradycardia.  Therefore shared decision was to reduce carvedilol from 12.5mg  to 3.15 mg p.o. twice daily.  He continues to tolerate anticoagulation well bleeding diathesis, will continue Xarelto.  We will also continue Tikosyn per EP.  Blood pressure is well controlled. He does have mild hyperlipidemia but not on therapy as he has no other cardiovascular factors and no  atherosclerosis by imaging studies.   Have requested labs from PCP for review.  Reviewed and discussed results of echocardiogram and Zio patch monitoring, details above.  Follow up in 2 weeks, sooner if needed, for atrial fibrillation and bradycardia with repeat EKG.    Alethia Berthold, PA-C 04/29/2022, 10:06 AM Office: [phone]

## 2022-04-30 ENCOUNTER — Encounter: Payer: Self-pay | Admitting: Student

## 2022-04-30 ENCOUNTER — Ambulatory Visit: Payer: BC Managed Care – PPO | Admitting: Student

## 2022-04-30 VITALS — BP 127/86 | HR 70 | Temp 98.3°F | Resp 16 | Ht 66.0 in | Wt 216.0 lb

## 2022-04-30 DIAGNOSIS — R001 Bradycardia, unspecified: Secondary | ICD-10-CM | POA: Diagnosis not present

## 2022-04-30 DIAGNOSIS — I480 Paroxysmal atrial fibrillation: Secondary | ICD-10-CM

## 2022-05-10 ENCOUNTER — Telehealth: Payer: Self-pay | Admitting: *Deleted

## 2022-05-10 DIAGNOSIS — G4733 Obstructive sleep apnea (adult) (pediatric): Secondary | ICD-10-CM

## 2022-05-10 NOTE — Telephone Encounter (Signed)
Spoke with patient having trouble with CPAP machine . Pt has had CPAP machine since 2016 . Pt states his BP is elevated and having issues breathing at night . Pt called [facility] they made his appointment to trouble shoot CPAP machine and than cancelled it because start date is 2016 .Per patient [facility] states need new order for CPAP machine because  Warranty has ran out on current  CPAP machine , Called [facility] spoke with Melissa . Per Everlean Cherry is going to call patient back to do a remote trouble shooting. Per Efraim Kaufmann wants to see if she can get a order for patient to get his pressure increased to  20 . Informed Efraim Kaufmann will forward message to megan,np for new order to increase pressure . Called patient back gave patient plan going forward . Pt states he thinks he changed pressure to 18 but not for sure . Pt feels pressure is not the problem he needs a new ,machine . Informed patient I will send megan,np a request for a new order for CPAP machine . Informed patient that his insurance may require him to have another HST so that may take a couple of weeks .Expressed  to patient the importance of plan going forward to see if we can get him using his CPAP without having any issues until he can get his new CPAP MACHINE . Pt expressed understanding and thanked me for calling

## 2022-05-11 NOTE — Telephone Encounter (Signed)
Can we get clarity on why they want to increase his pressure to 20?

## 2022-05-11 NOTE — Telephone Encounter (Signed)
Are we able to get a DL-  I would like to see a DL before increasing pressure

## 2022-05-11 NOTE — Telephone Encounter (Signed)
Coltrane, Dickie La, RN; [place], Ashly; [place], Shiro We will let the patient know as soon as we have the order for the new machine. Our SET, Tracee, spoke with the patient yesterday around 4pm and suggested he contact the MD as the pt stated he felt like he was suffocating. She informed him that we can not change pressure settings without a rx from the md

## 2022-05-11 NOTE — Telephone Encounter (Signed)
Per Naaman Plummer,  they want to increase the pressure until he is able to get new order for machine or if needed sleep study.  He is on 16cm.

## 2022-05-11 NOTE — Telephone Encounter (Signed)
Hey this pt, I sent message to [facility], when he was set up 09-07-2021 or 2016?

## 2022-05-12 NOTE — Telephone Encounter (Signed)
Coltrane, Dickie La, RN He was very first set up with [facility] in 07/30/2015.Marland KitchenMarland KitchenThat far back he may not have had a machine that was in [place]

## 2022-05-12 NOTE — Telephone Encounter (Signed)
Spoke with patient explained the plan moving forward . Pt was not happy because he is currently going out of town and wont be back until next week Pt states he wanted to take care of this before today Explained to patient it takes time to make sure we have a plan going forward until he gets his new CPAP machine . Pt states the better plain was to have this taken care of  before he went out of town today  . Apologized   to patient . Patient informed me that he wears his CPAP at times but he feels better when he doesn't wear CPAP machine . Pt states he can change from home I explained to patient he cant and he has go to [facility] for them to change setting . Pt states well he just has to wait for [facility] to call . Pt states thanked me  and hung up

## 2022-05-12 NOTE — Telephone Encounter (Signed)
Spoke with Aundra Millet NP. Plan to order a change from CPAP to autoset 16-20 cm to allow for pressure adjustment while sleeping. Also needs HST for new machine. If patient amenable place orders.

## 2022-05-12 NOTE — Telephone Encounter (Signed)
Sandy or Gaastra- when one of you are free- lets discuss this

## 2022-05-17 ENCOUNTER — Other Ambulatory Visit: Payer: Self-pay | Admitting: *Deleted

## 2022-05-17 DIAGNOSIS — G4733 Obstructive sleep apnea (adult) (pediatric): Secondary | ICD-10-CM

## 2022-05-17 NOTE — Telephone Encounter (Signed)
I spoke with the patient and scheduled his HST for 05/25/22 at 11:00 AM.    [employer] auth: No Berkley Harvey req spoke to [facility] Ref # [id number].  Patient also informed me he has not heard anything from [facility] or our office about the pressure for his mask. He states he doesn't know if this has been done yet or if it is going to be done before his HST appointment.

## 2022-05-17 NOTE — Telephone Encounter (Addendum)
I placed the order.  Megan NP to sign off.

## 2022-05-18 ENCOUNTER — Telehealth: Payer: Self-pay

## 2022-05-18 NOTE — Telephone Encounter (Signed)
Spoke with patient made him aware [facility] has his orders and per [facility] will cal him back today  Made the patient aware that he will have to take CPAP machine to [facility]  for them to change CPAP to autopap . Pt was upset because he  thinks it was a time delay  pt also stated he takes 10 minutes for someone to sign a order . Informed  patient its  a process and  Steps that we have to follow to make sure he gets the proper care  Pt didn't believe me and said whatever and hung up the phone

## 2022-05-18 NOTE — Telephone Encounter (Signed)
We expect BP to fluctuate. Those number are all still fairly well controlled. Would not recommend changes at this time.

## 2022-05-18 NOTE — Telephone Encounter (Signed)
Called and spoke to patient.

## 2022-05-18 NOTE — Telephone Encounter (Signed)
Pt would like to know if the order has been sent to [facility]. Pt would like a call back from the nurse

## 2022-05-25 ENCOUNTER — Ambulatory Visit: Payer: BC Managed Care – PPO | Admitting: Neurology

## 2022-05-25 DIAGNOSIS — G4733 Obstructive sleep apnea (adult) (pediatric): Secondary | ICD-10-CM | POA: Diagnosis not present

## 2022-05-25 DIAGNOSIS — Z9989 Dependence on other enabling machines and devices: Secondary | ICD-10-CM

## 2022-05-27 NOTE — Progress Notes (Signed)
See procedure note.

## 2022-05-28 NOTE — Procedures (Signed)
   [facility]  HOME SLEEP TEST (Watch PAT) REPORT  STUDY DATE: 05/25/2022  DOB: 1966/10/30  MRN: [id number]  ORDERING CLINICIAN: Huston Foley, MD, PhD   REFERRING CLINICIAN: Butch Penny, NP  CLINICAL INFORMATION/HISTORY: 56 year old gentleman with an underlying medical history of hypertension, atrial fibrillation with status post ablation, congestive heart failure, gout, reflux disease, hyperlipidemia, PVCs, and obesity, who has been on CPAP therapy at a pressure of 16 cm for his sleep apnea.  He qualifies for new equipment and presents for reevaluation.  Epworth sleepiness score: 10/24.  BMI: 34.4 kg/m  FINDINGS:   Sleep Summary:   Total Recording Time (hours, min): 7 hours, 2 minutes  Total Sleep Time (hours, min):  5 hours, 1 minute  Percent REM (%):    15.4%   Respiratory Indices:   Calculated pAHI (per hour):  14.6/hour         REM pAHI:    17.4/hour       NREM pAHI: 14.1/hour  Oxygen Saturation Statistics:    Oxygen Saturation (%) Mean: 94%   Minimum oxygen saturation (%):                 67%   O2 Saturation Range (%): 67-98%    O2 Saturation (minutes) <=88%: 3.5 min  Pulse Rate Statistics:   Pulse Mean (bpm):    63/min    Pulse Range (37-105/min)   IMPRESSION: OSA (obstructive sleep apnea)   RECOMMENDATION:  This home sleep test demonstrates overall mild (near-moderate) obstructive sleep apnea (by number of events) with a total AHI of 14.6/hour and O2 nadir of 67% which was brief and once only.  He had several desaturations into the mid to higher 80s for the night.  Intermittent mild to moderate snoring was detected.  Ongoing treatment with positive airway pressure is recommended. The patient will be advised to proceed with an autoPAP titration/trial at home for now with a new machine, he is currently on a set pressure of 16 cm with good apnea control.  His new AutoPap can be set between 14 and 18 cm. A full night titration study  may be considered to optimize treatment settings, if needed down the road. Please note that untreated obstructive sleep apnea may carry additional perioperative morbidity. Patients with significant obstructive sleep apnea should receive perioperative PAP therapy and the surgeons and particularly the anesthesiologist should be informed of the diagnosis and the severity of the sleep disordered breathing. The patient should be cautioned not to drive, work at heights, or operate dangerous or heavy equipment when tired or sleepy. Review and reiteration of good sleep hygiene measures should be pursued with any patient. Other causes of the patient's symptoms, including circadian rhythm disturbances, an underlying mood disorder, medication effect and/or an underlying medical problem cannot be ruled out based on this test. Clinical correlation is recommended. The patient and his referring provider will be notified of the test results. The patient will be seen in follow up in sleep clinic at [facility].  I certify that I have reviewed the raw data recording prior to the issuance of this report in accordance with the standards of the American Academy of Sleep Medicine (AASM).   INTERPRETING PHYSICIAN:   Huston Foley, MD, PhD  Board Certified in Neurology and Sleep Medicine  [facility] [address] [place], [place] [address] [phone]

## 2022-05-31 ENCOUNTER — Telehealth: Payer: Self-pay | Admitting: Adult Health

## 2022-05-31 ENCOUNTER — Telehealth: Payer: Self-pay | Admitting: *Deleted

## 2022-05-31 NOTE — Telephone Encounter (Signed)
Handled in other encounter.

## 2022-05-31 NOTE — Telephone Encounter (Signed)
-----   Message from Butch Penny, NP sent at 05/31/2022  3:15 PM EDT ----- Mild almost moderate OSA on CPAP on study. Will send order for auto-pap 14-18 cm H20

## 2022-05-31 NOTE — Telephone Encounter (Signed)
Spoke with patient gave results of Sleep study Pt staying with [facility] for DME   Pt states when pressure with old machine  was changed he did feel a little better  but felt he was still off a little bit Informed patient to try the new Autopap to see how he feels and if he still feels a little off please call us back . Patient  states he hopes that the  new autopap helps  a lot Pt  is aware of compliance and scheduled f/u  07/14/2022 Pt thanked me for calling  Faxed new orders to [facility] today

## 2022-05-31 NOTE — Addendum Note (Signed)
Addended by: Enedina Finner on: 05/31/2022 03:15 PM   Modules accepted: Orders

## 2022-05-31 NOTE — Telephone Encounter (Signed)
Butch Penny, NP  05/31/2022  3:15 PM EDT Back to Top    Mild almost moderate OSA on CPAP on study. Will send order for auto-pap 14-18 cm H20

## 2022-05-31 NOTE — Telephone Encounter (Signed)
Todd [name], can you look into this?

## 2022-05-31 NOTE — Telephone Encounter (Signed)
Pt would like a call from a nurse to discuss test results. Pt seen something pop-up on mychart but can't open it to look at results.

## 2022-06-14 ENCOUNTER — Other Ambulatory Visit: Payer: Self-pay | Admitting: Cardiology

## 2022-06-25 DIAGNOSIS — G4733 Obstructive sleep apnea (adult) (pediatric): Secondary | ICD-10-CM | POA: Diagnosis not present

## 2022-06-28 ENCOUNTER — Other Ambulatory Visit: Payer: Self-pay | Admitting: Cardiology

## 2022-06-28 DIAGNOSIS — I480 Paroxysmal atrial fibrillation: Secondary | ICD-10-CM

## 2022-06-28 NOTE — Telephone Encounter (Signed)
Refill request

## 2022-07-07 ENCOUNTER — Telehealth: Payer: Self-pay | Admitting: Adult Health

## 2022-07-07 NOTE — Telephone Encounter (Signed)
Patient still has not been able to get his new machine and wanted to discuss. Should he reschedule his 8/16 appt or is there anything that you can do?

## 2022-07-07 NOTE — Telephone Encounter (Signed)
Patient states he is wanting a mask refit as well. He spoke with [facility] last week but they haven't called him back. I called [facility] and spoke with Terri. She will call me back asap.

## 2022-07-08 NOTE — Telephone Encounter (Signed)
We had received an order from the [place] office for [facility] NP to sign. I sent a message to Terri w/ local [facility] office to see if this is still needed.

## 2022-07-08 NOTE — Telephone Encounter (Signed)
Coltrane, Dickie La, RN; [place], [place]; Arvella Merles He has an appointment next week to pick up his new machine      Previous Messages    ----- Message -----  From: Guy Begin, RN  Sent: 07/07/2022   4:18 PM EDT  To: Arvella Merles; Terri Coltrane; *   Patient called [place] and is still has not been able to get his new machine.     Can you tell me what is happening?   [name]. Kathreen Cornfield.  Male, 55 y.o., 08-11-1966  MRN:  [id number]  Andrey Campanile RN

## 2022-07-12 NOTE — Telephone Encounter (Signed)
Per Camelia Eng w/ [facility], nothing further seems to be needed. Patient has an appt scheduled to pick up his equipment.

## 2022-07-14 ENCOUNTER — Ambulatory Visit: Payer: BC Managed Care – PPO | Admitting: Adult Health

## 2022-07-14 DIAGNOSIS — G4733 Obstructive sleep apnea (adult) (pediatric): Secondary | ICD-10-CM | POA: Diagnosis not present

## 2022-07-22 ENCOUNTER — Telehealth: Payer: Self-pay | Admitting: Adult Health

## 2022-07-22 NOTE — Telephone Encounter (Signed)
Pt called wanting to discuss the rising numbers of his AHI Report that he noticed when he pulled it up the other night. Please advise

## 2022-07-22 NOTE — Telephone Encounter (Signed)
Spoke with patient. I reassured him that AHI is low. His baseline AHI on HST was 14. The patient was in particular concerned about last night's data, stating AHI was 2.6. He's used to overall AHI being <1 on CPAP. He is asking if this has anything to do with the change from CPAP to autoPAP. He states his CPAP machine towards the end did show AHI around 2 sometimes. He figured it could have something to do with the machine's condition. He said he turned the ramp off. He also said he uses a nasal mask and he wouldn't mind trying a FFM if he could have a sample. He said [facility] isn't able to provide one. His questions were answered. He verbalized appreciation for the call.

## 2022-07-22 NOTE — Telephone Encounter (Signed)
Compliance data looks good, mask leakage from the current mask is acceptable, I would recommend he continue with the nasal mask and his current settings.

## 2022-07-22 NOTE — Telephone Encounter (Signed)
I called pt and relayed the message from Dr. [name].  ( That the compliance data looks good, mask leakage from the current mask is acceptable, I would recommend he continue with the nasal mask and his current settings).  He was fine with that.  Will keep on.  Appreciated call back.

## 2022-07-22 NOTE — Telephone Encounter (Signed)
AHI is quite acceptable right now. He was setup on 07/14/22. He has used his machine 8/8 days so far.

## 2022-08-07 IMAGING — CT CT HEAD W/O CM
4 series · 15 of 47 positions shown, 17 images · non-contrast
Comparison: Brain MRI 01/04/2017.  Head CT 12/09/2016.

CLINICAL DATA: Traumatic injury of head, initial encounter.
Additional history provided: Patient reports intermittent headaches
and fatigue.



[Series 2: head 5.00 hr40 s3 axial ibhc · axial · 0.44mm/px · z∈[-622,-512]mm · 6 of 32 slices shown, 8 images]
[im 5/32  brain]
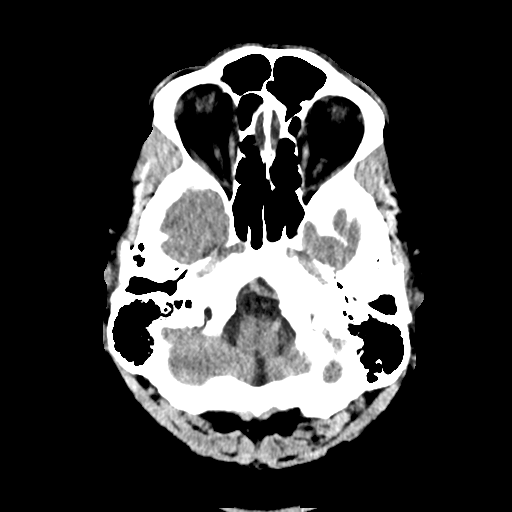
[im 5/32  bone]
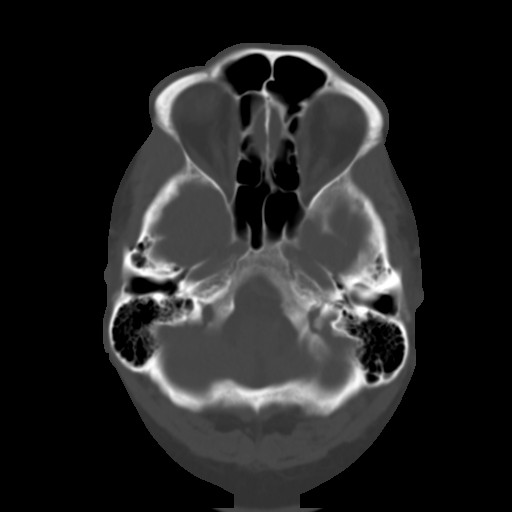
[im 9/32  brain]
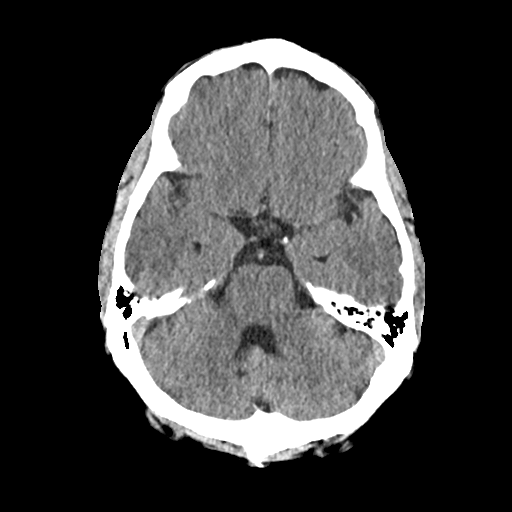
[im 14/32  brain]
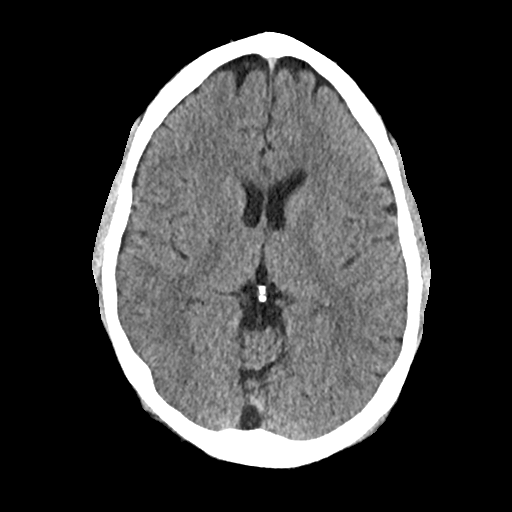
[im 18/32  brain]
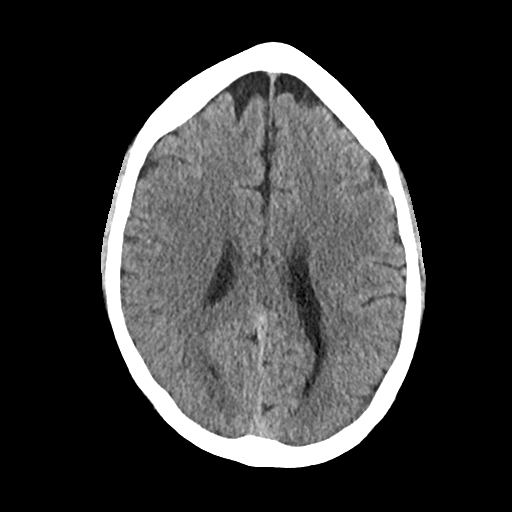
[im 23/32  brain]
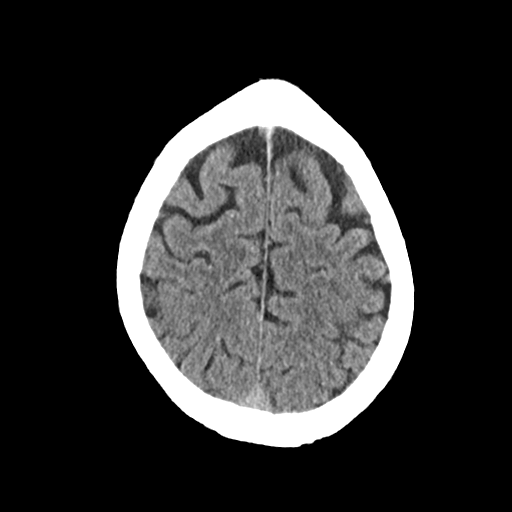
[im 23/32  bone]
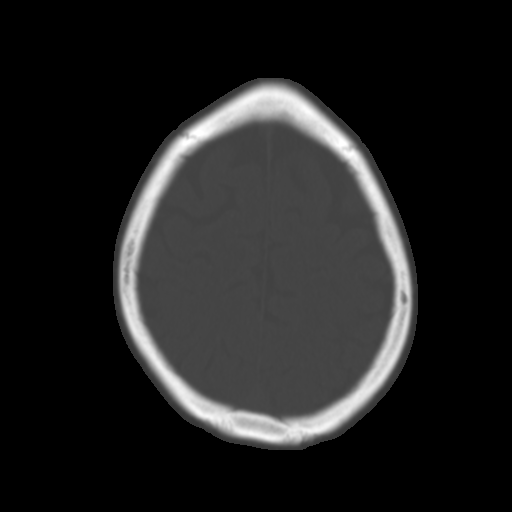
[im 27/32  brain]
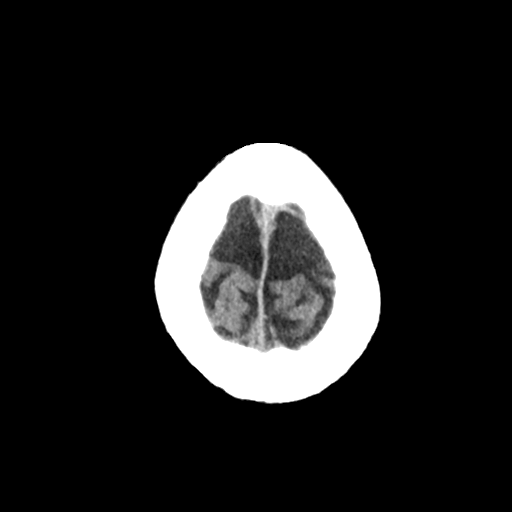

[Series 3: head 2.00 hr60 s3 axial bone · axial · 0.44mm/px · z∈[-629,-591]mm · 3 of 81 slices shown]
[im 8/81  bone]
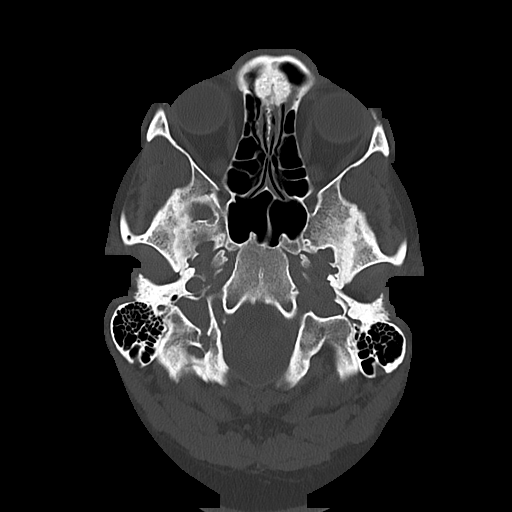
[im 16/81  bone]
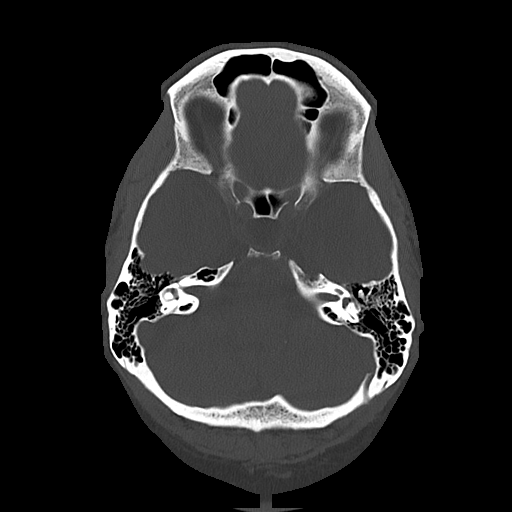
[im 27/81  bone]
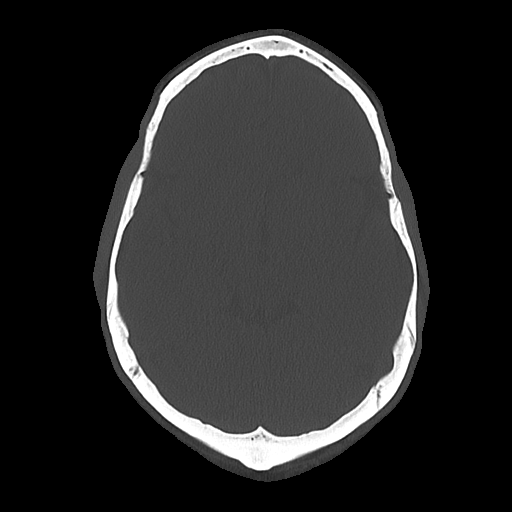

[Series 4: head 3.00 hr40 s3 sag · sagittal · 0.32mm/px · 3 of 75 slices shown]
[im 25/75  brain]
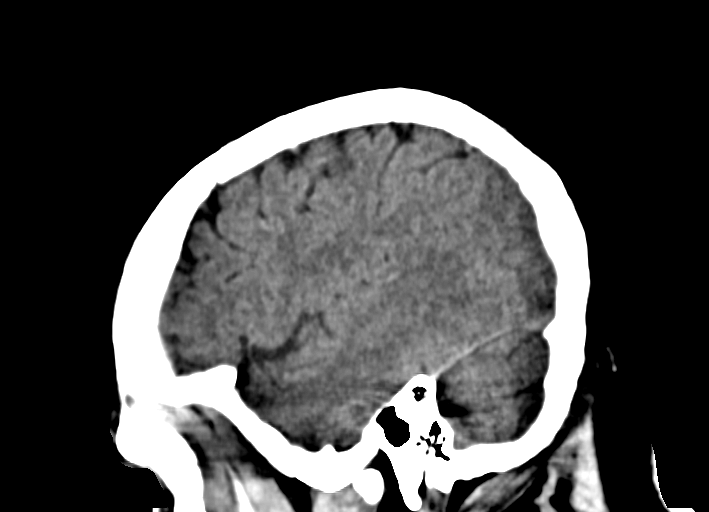
[im 38/75  brain]
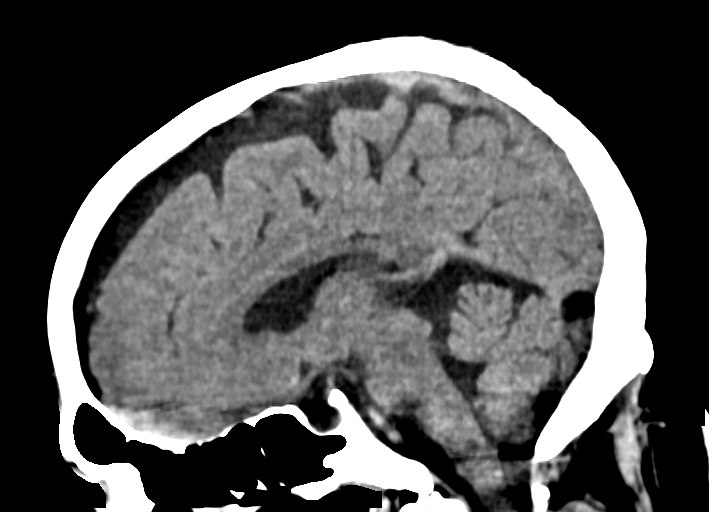
[im 50/75  brain]
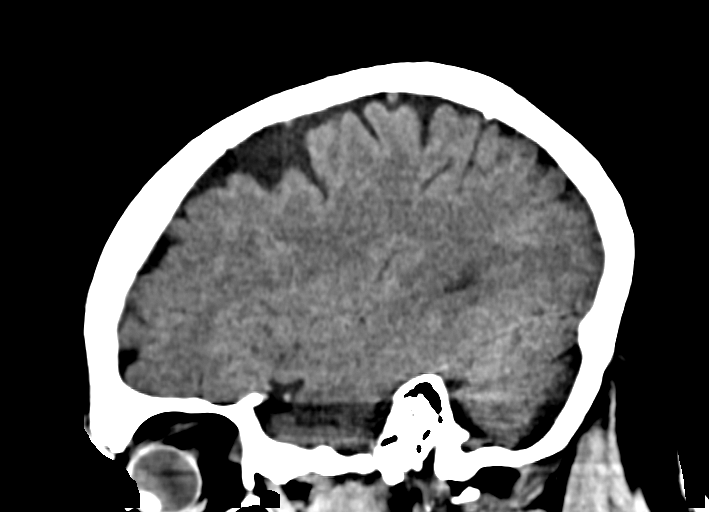

[Series 6: head 3.00 hr40 s3 cor · coronal · 0.32mm/px · 3 of 75 slices shown]
[im 25/75  brain]
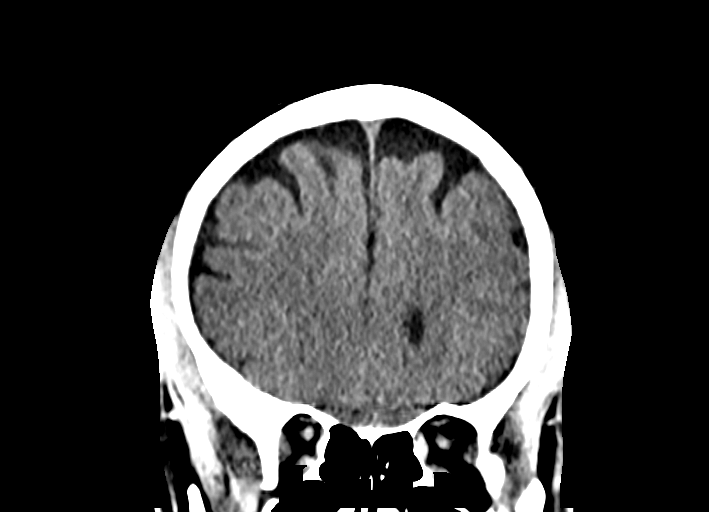
[im 33/75  brain]
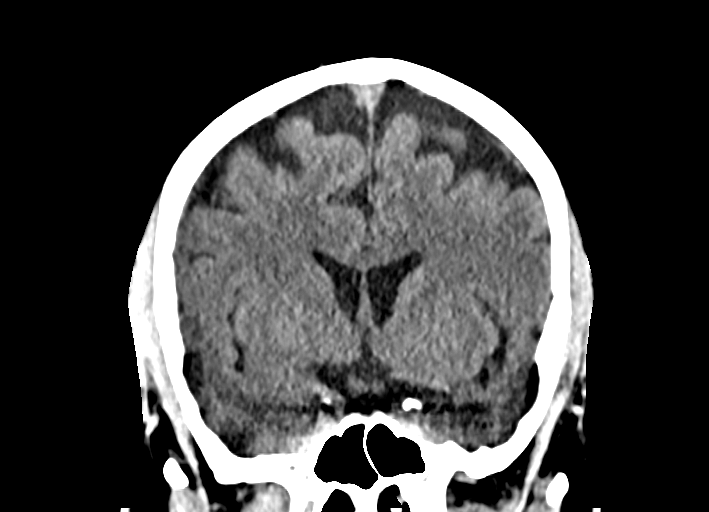
[im 42/75  brain]
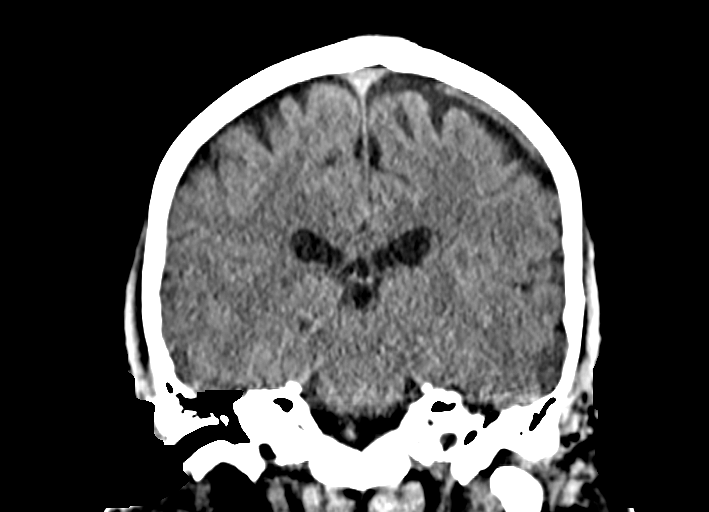

[15 of 47 positions shown; findings below may reference images not displayed]

FINDINGS: Brain:

No age-advanced or lobar predominant parenchymal atrophy.

Redemonstrated partially empty sella turcica, a nonspecific finding.

There is no acute intracranial hemorrhage.

No demarcated cortical infarct.

No extra-axial fluid collection.

No evidence of an intracranial mass.

No midline shift.

Vascular: No hyperdense vessel.

Skull: No acute fracture or aggressive osseous lesion.

Sinuses/Orbits: No mass or acute finding within the imaged orbits.
No significant paranasal sinus disease at the imaged levels.

Other: Congenital nonunion of the posterior arch of C1.
IMPRESSION: No evidence of acute intracranial abnormality.

## 2022-08-14 DIAGNOSIS — G4733 Obstructive sleep apnea (adult) (pediatric): Secondary | ICD-10-CM | POA: Diagnosis not present

## 2022-08-31 ENCOUNTER — Other Ambulatory Visit: Payer: Self-pay

## 2022-08-31 MED ORDER — CARVEDILOL 3.125 MG PO TABS: 3.1250 mg | ORAL_TABLET | Freq: Two times a day (BID) | ORAL | 3 refills | Status: DC

## 2022-09-02 DIAGNOSIS — H5213 Myopia, bilateral: Secondary | ICD-10-CM | POA: Diagnosis not present

## 2022-09-02 DIAGNOSIS — H04123 Dry eye syndrome of bilateral lacrimal glands: Secondary | ICD-10-CM | POA: Diagnosis not present

## 2022-09-02 DIAGNOSIS — H52203 Unspecified astigmatism, bilateral: Secondary | ICD-10-CM | POA: Diagnosis not present

## 2022-09-13 DIAGNOSIS — G4733 Obstructive sleep apnea (adult) (pediatric): Secondary | ICD-10-CM | POA: Diagnosis not present

## 2022-10-14 ENCOUNTER — Encounter: Payer: Self-pay | Admitting: Neurology

## 2022-10-14 ENCOUNTER — Ambulatory Visit (INDEPENDENT_AMBULATORY_CARE_PROVIDER_SITE_OTHER): Payer: BC Managed Care – PPO | Admitting: Neurology

## 2022-10-14 VITALS — BP 117/85 | HR 72 | Ht 66.0 in | Wt 205.0 lb

## 2022-10-14 DIAGNOSIS — G4733 Obstructive sleep apnea (adult) (pediatric): Secondary | ICD-10-CM

## 2022-10-14 NOTE — Patient Instructions (Signed)
It was nice to see you again today. I am glad to hear, things are going well with your autoPAP therapy. You have adjusted well to treatment with your new machine, and you are compliant with it. You have also fulfilled the insurance-mandated compliance percentage, which is reassuring, so you can get ongoing supplies through your insurance. Please talk to your DME provider about getting replacement supplies on a regular basis. Please be sure to change your filter every month, your mask about every 3 months, hose about every 6 months, humidifier chamber about yearly. Some restrictions are imposed by your insurance carrier with regard to how frequently you can get certain supplies.  Your DME company can provide further details if necessary.   Please continue using your autoPAP regularly. While your insurance requires that you use PAP at least 4 hours each night on 70% of the nights, I recommend, that you not skip any nights and use it throughout the night if you can. Getting used to PAP and staying with the treatment long term does take time and patience and discipline. Untreated obstructive sleep apnea when it is moderate to severe can have an adverse impact on cardiovascular health and raise her risk for heart disease, arrhythmias, hypertension, congestive heart failure, stroke and diabetes. Untreated obstructive sleep apnea causes sleep disruption, nonrestorative sleep, and sleep deprivation. This can have an impact on your day to day functioning and cause daytime sleepiness and impairment of cognitive function, memory loss, mood disturbance, and problems focussing. Using PAP regularly can improve these symptoms.  We can see you in 1 year, you can see one of our nurse practitioners as you are stable.   

## 2022-10-14 NOTE — Progress Notes (Signed)
Subjective:    Patient ID: Todd [name]. is a 56 y.o. male.  HPI    Interim history:   Todd [name] is a 56 year old right-handed gentleman with an underlying medical history of reflux disease, hyperlipidemia, gout, PVCs, atrial fibrillation with status post ablation in 2017, congestive heart failure, dizziness, impaired fasting glucose, hypertension and obesity, who presents for follow-up consultation of his OSA, after interim testing and starting a new AutoPap machine.  The patient is unaccompanied today.  I last saw him on 02/09/2017, at which time he was not fully compliant with his CPAP of 15 cm.  He was having trouble tolerating the nasal interface and also the pressure.  He was advised to utilize melatonin at night for sleep.  He was discouraged from utilizing alcohol as a sleep aid as it is a known sleep disrupter.  He saw Todd Rubin, NP in the interim on 05/12/2017, at which time his CPAP compliance had improved.  Apnea control was good on a pressure of 15 cm.  He saw Todd Rubin, NP, on 08/16/2017, at which time his CPAP compliance was good.  He denied any daytime drowsiness.   He saw Todd Rubin, NP, on 02/15/2018, at which time he was doing quite well with his CPAP.  He saw Todd Rubin, NP in follow-up on 11/30/2018, at which time he reported not sleeping well recently.  He had stress, worried about his wife's health as she suffered an injury.  His compliance was very good on his CPAP.  He saw Todd Browner, NP, on 02/20/2020, at which time he was compliant with his CPAP.  His apnea control was good.  He saw Todd Browner, NP, on 03/18/2021, at which time he was compliant with his CPAP, set pressure was 16.6 at the time, residual AHI 0.8/h for the month of mid March through mid April 2022.  His leak from the mask was low.  He saw Todd Browner, NP, on 03/18/2022, at which time he reported not feeling fully rested.  He called in the interim reporting that his blood  pressure was elevated, he requested a pressure increase.  He reported that his machine was not working properly.   We proceeded with a sleep study.  He had a home sleep test on 05/25/2020 which AHI of 14.6/h, O2 nadir 67%, which was brief and once only, he had several desaturations into the mid to higher 80s.  Intermittent mild to moderate snoring was detected.  He was given a new AutoPap machine.  Set up date was 07/14/2022.  He has a ResMed air sense 11 AutoSet machine.  Today, 10/14/2022: I reviewed his AutoPap compliance data from 09/13/2022 through 10/12/2022, which is a total of 30 days, during which time he used his machine 29 days with percent use days greater than 4 hours at 77%, indicating adequate compliance, average usage for days on treatment at 5 hours, 0 minutes.  Residual AHI at goal at 1.3/h, 95th percentile of pressure at 14.9 cm with a range of 14 to 18 cm, leak on the higher side with some fluctuation, 95th percentile at 25.4 L/min.  He reports doing well with his new machine, he has adjusted well to treatment.  He uses a nasal mask, sometimes his sleep is restless.  He is motivated to continue with treatment. He reports that he has still trouble adjusting to CPAP. The pressure currently does not bother him, he had a total for talking with a nasal pillows, especially after his surgery and  uses a nasal mask now. His sleep is restless, he has a tendency to miss with the mask take it off in the middle of the night, not realizing it. When he is awakened realizes that he pulled off his mask or if he goes to the bathroom and comes back he tries to put the mask back on. He has skipped several nights in the past 30 and in the past 90 days, he uses the machine about 70% of the time but most nights averaging less than 4 hours. He does report feeling tired during the day, he works 3 12-hour shifts on weekend nights and during the work week he is able to take care of his daughter who is 5. His wife  works during the week, day shift. He had in the interim nasal surgery under Dr. Benjamine [name]. He is status post septoplasty and turbinate reduction on 09/14/2016.  He is had a cold off and on, overall nasal congestion and nasal breathing are much improved after nasal surgery. He tries to use the reference that he was given by the ENT surgeon.    The patient's allergies, current medications, family history, past medical history, past social history, past surgical history and problem list were reviewed and updated as appropriate.    Previously (copied from previous notes for reference):   I first met him on 05/27/2015 at the request of his cardiologist, at which time the patient reported a prior diagnosis of OSA. He reported difficulty with CPAP in the past and he stopped using CPAP several years prior. I invited him for sleep study. He had a baseline sleep study, followed by a CPAP titration study. I went over his test results with him in detail today. His baseline sleep study from 06/24/2015 showed a sleep efficiency of 44% only, sleep latency was prolonged at 40.5 minutes and wake after sleep onset was high at 3 hours. He had severe sleep fragmentation. He had absence of slow-wave sleep and absence of REM sleep. He had frequent PVCs and PACs on EKG. He had mild to moderate snoring. Total AHI was highly elevated at 83.3 per hour, average oxygen saturation was 92%, nadir was 82% during non-REM sleep. He had no significant PLMS. Based on his medical history and his test results I invited him for a full night CPAP titration study. He had this on 07/17/2015. Sleep efficiency was 61.1%, sleep latency was 72 minutes, wake after sleep onset was 82 minutes. He had a normal percentage of slow-wave sleep and a mildly reduced percentage of REM sleep at 15.9% with a prolonged REM latency of 174 minutes. He had frequent PVCs and PACs on single-lead EKG. CPAP was titrated from 4 cm to 18 cm, AHI was reduced to 3.8 per hour at the  final pressure. Supine REM sleep was achieved. Average oxygen saturation was 94%, nadir was 78%. O2 nadir was 92% during supine REM sleep on the final pressure of CPAP. He was fitted with a full facemask. Based on his test results are prescribed CPAP therapy for home use. He did not return for follow-up since then.   I reviewed his CPAP compliance data from 01/09/2017 through 02/07/2017 which is a total of 30 days, during which time he used his CPAP only 21 days with percent used days greater than 4 hours at 20% only, indicating poor compliance with an average usage of 3 hours and 15 minutes only, residual AHI 3.1 per hour, leak acceptable with the 95th percentile at 7.9 L/m on  a pressure of 15 cm with EPR of 3.    05/27/2015: He was previously diagnosed with obstructive sleep apnea. Prior test results are not available for my review. He reports a diagnosis of severe obstructive sleep apnea. He was using CPAP in the past but it was difficult for him to use it. He stopped using it a few years ago. He has gained weight. He has had some palpitations and chest pain. He had recent further testing in the form of echocardiogram and a nuclear stress test and has an appointment for follow-up this week with you to discuss test results as I understand. He works night shifts on Fridays, Saturdays and Sundays, 12 hours, from 7 PM to 7 AM. On Monday mornings he tries to nap around lunchtime and then go to bed at night. His nighttime bedtime is around 11 or 11:30 PM. He is a loud snorer and has apneic pauses while asleep and wakes up with a sense of gasping for air. He denies morning headaches but has nocturia once or twice on an average night. He denies restless leg symptoms or leg twitching at night. He is not aware of any family history of sleep apnea. He has some stress lately because of his parents health. His mother is 27 and is in end-stage lung cancer. His father is 58 years old and recently fell and broke his hip and  had surgery today. His Epworth sleepiness score is 11 out of 24, fatigue score is 25 out of 63. He is a nonsmoker. He drinks caffeine occasionally but does not have to have it daily. He drinks approximately 1 beer a week.   The patient's allergies, current medications, family history, past medical history, past social history, past surgical history and problem list were reviewed and updated as appropriate.    His Past Medical History Is Significant For: Past Medical History:  Diagnosis Date   Arrhythmia    A. Fibrillation S/P ablation 3/9/201   CHF (congestive heart failure) (HCC)    GERD (gastroesophageal reflux disease)    Gout    HLD (hyperlipidemia)    Hypertension    Obesity    OSA on CPAP    PVC (premature ventricular contraction)    Shortness of breath dyspnea     His Past Surgical History Is Significant For: Past Surgical History:  Procedure Laterality Date   CARDIAC CATHETERIZATION N/A 07/01/2015   Procedure: Left Heart Cath;  Surgeon: Adrian Prows, MD;  Location: [facility];  Service: Cardiovascular;  Laterality: N/A;   CARPAL TUNNEL RELEASE     ELECTROPHYSIOLOGIC STUDY N/A 02/05/2016   Procedure: Atrial Fibrillation Ablation;  Surgeon: Will Meredith Leeds, MD;  Location: [facility];  Service: Cardiovascular;  Laterality: N/A;   NASAL SEPTOPLASTY W/ TURBINOPLASTY Bilateral 09/14/2016   Procedure: NASAL SEPTOPLASTY WITH BILATERAL  TURBINATE REDUCTION;  Surgeon: Leta Baptist, MD;  Location: [facility];  Service: ENT;  Laterality: Bilateral;    His Family History Is Significant For: Family History  Problem Relation Age of Onset   Seizures Mother    Deep vein thrombosis Mother    COPD Mother    Heart failure Mother    Lung cancer Mother    Prostate cancer Father    Hypertension Father    Diabetes Father    Stroke Paternal Grandfather    Heart attack Neg Hx    Sleep apnea Neg Hx     His Social History Is Significant For: Social History  Socioeconomic History   Marital status: Married    Spouse name: Tonia   Number of children: 1   Years of education: HS   Highest education level: Not on file  Occupational History   Occupation: [employer]  Tobacco Use   Smoking status: Never   Smokeless tobacco: Never  Vaping Use   Vaping Use: Never used  Substance and Sexual Activity   Alcohol use: Not Currently    Alcohol/week: 2.0 standard drinks of alcohol    Types: 2 Cans of beer per week   Drug use: No   Sexual activity: Yes  Other Topics Concern   Not on file  Social History Narrative   Drinks 1 Mt Dew 2 days a week    Social Determinants of Radio broadcast assistant Strain: Not on file  Food Insecurity: Not on file  Transportation Needs: Not on file  Physical Activity: Not on file  Stress: Not on file  Social Connections: Not on file    His Allergies Are:  Allergies  Allergen Reactions   Metoprolol Other (See Comments)    Lethargy, didn't feel like doing anything (tolerates low dose carvedilol)   Valsartan Other (See Comments)    Increased blood pressure   Azithromycin Itching and Other (See Comments)    Tingling in fingers   Entresto [Sacubitril-Valsartan] Rash and Other (See Comments)    Tingling in fingers/ redness & sensitivity at tips of fingers  :   His Current Medications Are:  Outpatient Encounter Medications as of 10/14/2022  Medication Sig   acetaminophen (TYLENOL) 500 MG tablet Take 1,000 mg by mouth daily as needed for headache.   carvedilol (COREG) 3.125 MG tablet Take 1 tablet (3.125 mg total) by mouth 2 (two) times daily with a meal.   Cholecalciferol (VITAMIN D) 2000 units tablet Take 2,000 Units by mouth daily.   colchicine 0.6 MG tablet Take 1 tablet by mouth as needed.   dofetilide (TIKOSYN) 500 MCG capsule TAKE 1 CAPSULE EVERY TWELVE HOURS.   olmesartan (BENICAR) 40 MG tablet Take 20 mg by mouth at bedtime.   omeprazole (PRILOSEC) 20 MG capsule Take 20 mg by mouth as needed.     XARELTO 20 MG TABS tablet TAKE 1 TABLET BY MOUTH EVERY DAY   No facility-administered encounter medications on file as of 10/14/2022.  :  Review of Systems:  Out of a complete 14 point review of systems, all are reviewed and negative with the exception of these symptoms as listed below:  Review of Systems  Neurological:        Pt here for CPAP f/u Pt states no questions or concerns for today's  visit      ESS: 6    Objective:  Neurological Exam  Physical Exam Physical Examination:   Vitals:   10/14/22 1051  BP: 117/85  Pulse: 72    General Examination: The patient is a very pleasant 56 y.o. male in no acute distress. He appears well-developed and well-nourished and well groomed.   HEENT: Normocephalic, atraumatic, pupils are equal, round and reactive to light and accommodation.  Extraocular tracking is good without limitation to gaze excursion or nystagmus noted. Normal smooth pursuit is noted. Hearing is grossly intact. Face is symmetric with normal facial animation and normal facial sensation. Speech is clear with no dysarthria noted. There is no hypophonia. There is no lip, neck/head, jaw or voice tremor. Neck is supple with full range of passive and active motion. There are no carotid bruits on  auscultation. Oropharynx exam reveals: mild mouth dryness, adequate dental hygiene and marked airway crowding. Mallampati is class III. Tongue protrudes centrally and palate elevates symmetrically.    Chest: Clear to auscultation without wheezing, rhonchi or crackles noted.   Heart: S1+S2+0, regular and normal without murmurs, rubs or gallops noted.    Abdomen: Soft, non-tender and non-distended with normal bowel sounds appreciated on auscultation.   Extremities: There is no pitting edema in the distal lower extremities bilaterally.    Skin: Warm and dry without trophic changes noted.   Musculoskeletal: exam reveals no obvious joint deformities.    Neurologically:  Mental  status: The patient is awake, alert and oriented in all 4 spheres. His immediate and remote memory, attention, language skills and fund of knowledge are appropriate. There is no evidence of aphasia, agnosia, apraxia or anomia. Speech is clear with normal prosody and enunciation. Thought process is linear. Mood is normal and affect is normal.  Cranial nerves II - XII are as described above under HEENT exam.  Motor exam: Normal bulk, strength and tone is noted. There is no obvious tremor. Fine motor skills and coordination: Grossly intact.  Cerebellar testing: No dysmetria or intention tremor. There is no truncal or gait ataxia.  Sensory exam: intact to light touch in the upper and lower extremities.  Gait, station and balance: He stands easily. No veering to one side is noted. No leaning to one side is noted. Posture is age-appropriate and stance is narrow based. Gait shows normal stride length and normal pace. No problems turning are noted.    Assessment and Plan:  In summary, Todd [name] is a 56 year old male with an underlying medical history of reflux disease, hyperlipidemia, gout, PVCs, atrial fibrillation with status post ablation in 2017, congestive heart failure, dizziness, impaired fasting glucose, hypertension and obesity, who presents for follow-up consultation of his OSA, after interim testing and starting a new AutoPap machine.  He had a home sleep test on 05/25/2022 which showed an AHI of 14.6/h, O2 nadir 67% with several desaturations into the mid to high 80s.  He established treatment with a new AutoPap machine on 07/14/2022.  He is compliant with treatment, he uses a nasal mask with good success.  He is motivated to continue with treatment.  Echocardiogram from May 2022 showed improved EF.  His carvedilol has been reduced by his cardiologist. He is commended for his treatment adherence, apnea control is good.  He is advised to follow-up routinely to see one of our nurse practitioners  in 1 year.  We can offer him a virtual visit through [facility] as well.  I answered all his questions today and he was in agreement.   I spent 30 minutes in total face-to-face time and in reviewing records during pre-charting, more than 50% of which was spent in counseling and coordination of care, reviewing test results, reviewing medications and treatment regimen and/or in discussing or reviewing the diagnosis of OSA, the prognosis and treatment options. Pertinent laboratory and imaging test results that were available during this visit with the patient were reviewed by me and considered in my medical decision making (see chart for details).

## 2022-11-01 ENCOUNTER — Encounter: Payer: Self-pay | Admitting: Neurology

## 2022-12-01 DIAGNOSIS — E559 Vitamin D deficiency, unspecified: Secondary | ICD-10-CM | POA: Diagnosis not present

## 2022-12-01 DIAGNOSIS — I10 Essential (primary) hypertension: Secondary | ICD-10-CM | POA: Diagnosis not present

## 2022-12-01 DIAGNOSIS — E785 Hyperlipidemia, unspecified: Secondary | ICD-10-CM | POA: Diagnosis not present

## 2022-12-01 DIAGNOSIS — R7301 Impaired fasting glucose: Secondary | ICD-10-CM | POA: Diagnosis not present

## 2022-12-01 DIAGNOSIS — Z125 Encounter for screening for malignant neoplasm of prostate: Secondary | ICD-10-CM | POA: Diagnosis not present

## 2022-12-14 ENCOUNTER — Other Ambulatory Visit: Payer: Self-pay | Admitting: Cardiology

## 2022-12-14 DIAGNOSIS — G4733 Obstructive sleep apnea (adult) (pediatric): Secondary | ICD-10-CM | POA: Diagnosis not present

## 2022-12-17 DIAGNOSIS — Z0000 Encounter for general adult medical examination without abnormal findings: Secondary | ICD-10-CM | POA: Diagnosis not present

## 2022-12-17 DIAGNOSIS — Z1339 Encounter for screening examination for other mental health and behavioral disorders: Secondary | ICD-10-CM | POA: Diagnosis not present

## 2022-12-17 DIAGNOSIS — Z1331 Encounter for screening for depression: Secondary | ICD-10-CM | POA: Diagnosis not present

## 2022-12-17 DIAGNOSIS — I110 Hypertensive heart disease with heart failure: Secondary | ICD-10-CM | POA: Diagnosis not present

## 2022-12-17 DIAGNOSIS — Z23 Encounter for immunization: Secondary | ICD-10-CM | POA: Diagnosis not present

## 2023-01-14 DIAGNOSIS — G4733 Obstructive sleep apnea (adult) (pediatric): Secondary | ICD-10-CM | POA: Diagnosis not present

## 2023-02-12 DIAGNOSIS — G4733 Obstructive sleep apnea (adult) (pediatric): Secondary | ICD-10-CM | POA: Diagnosis not present

## 2023-03-10 DIAGNOSIS — H10411 Chronic giant papillary conjunctivitis, right eye: Secondary | ICD-10-CM | POA: Diagnosis not present

## 2023-03-12 ENCOUNTER — Other Ambulatory Visit: Payer: Self-pay | Admitting: Cardiology

## 2023-03-12 DIAGNOSIS — I480 Paroxysmal atrial fibrillation: Secondary | ICD-10-CM

## 2023-03-15 DIAGNOSIS — G4733 Obstructive sleep apnea (adult) (pediatric): Secondary | ICD-10-CM | POA: Diagnosis not present

## 2023-03-23 ENCOUNTER — Ambulatory Visit: Payer: BC Managed Care – PPO | Admitting: Adult Health

## 2023-04-06 DIAGNOSIS — I10 Essential (primary) hypertension: Secondary | ICD-10-CM | POA: Diagnosis not present

## 2023-04-06 DIAGNOSIS — G4733 Obstructive sleep apnea (adult) (pediatric): Secondary | ICD-10-CM | POA: Diagnosis not present

## 2023-04-06 DIAGNOSIS — I480 Paroxysmal atrial fibrillation: Secondary | ICD-10-CM | POA: Diagnosis not present

## 2023-04-06 DIAGNOSIS — G589 Mononeuropathy, unspecified: Secondary | ICD-10-CM | POA: Diagnosis not present

## 2023-04-14 DIAGNOSIS — G4733 Obstructive sleep apnea (adult) (pediatric): Secondary | ICD-10-CM | POA: Diagnosis not present

## 2023-04-27 ENCOUNTER — Ambulatory Visit: Payer: BC Managed Care – PPO | Admitting: Internal Medicine

## 2023-05-05 ENCOUNTER — Ambulatory Visit: Payer: BC Managed Care – PPO | Admitting: Student

## 2023-05-05 ENCOUNTER — Ambulatory Visit: Payer: BC Managed Care – PPO | Admitting: Internal Medicine

## 2023-05-15 DIAGNOSIS — G4733 Obstructive sleep apnea (adult) (pediatric): Secondary | ICD-10-CM | POA: Diagnosis not present

## 2023-05-17 ENCOUNTER — Encounter: Payer: Self-pay | Admitting: Cardiology

## 2023-05-17 ENCOUNTER — Ambulatory Visit: Payer: BC Managed Care – PPO | Admitting: Cardiology

## 2023-05-17 VITALS — BP 121/82 | HR 76 | Resp 16 | Ht 66.0 in | Wt 202.6 lb

## 2023-05-17 DIAGNOSIS — I5032 Chronic diastolic (congestive) heart failure: Secondary | ICD-10-CM

## 2023-05-17 DIAGNOSIS — I77819 Aortic ectasia, unspecified site: Secondary | ICD-10-CM | POA: Diagnosis not present

## 2023-05-17 DIAGNOSIS — Z9889 Other specified postprocedural states: Secondary | ICD-10-CM

## 2023-05-17 DIAGNOSIS — I480 Paroxysmal atrial fibrillation: Secondary | ICD-10-CM

## 2023-05-17 DIAGNOSIS — I428 Other cardiomyopathies: Secondary | ICD-10-CM

## 2023-05-17 DIAGNOSIS — G4733 Obstructive sleep apnea (adult) (pediatric): Secondary | ICD-10-CM

## 2023-05-17 DIAGNOSIS — I10 Essential (primary) hypertension: Secondary | ICD-10-CM

## 2023-05-17 NOTE — Progress Notes (Signed)
ID:  [name], DOB 09-May-1966, MRN [id number]  PCP:  Cleatis Polka., MD  Cardiologist:  Tessa Lerner, DO, [facility] (established care 05/17/23) Former Cardiology Providers: Dr. Yates Decamp, Elvin So, [place]  Date: 05/17/23 Last Visit: April 30, 2022  Chief Complaint  Patient presents with   Hypertension   New Patient (Initial Visit)    HPI  Todd [name]. is a 57 y.o. Caucasian male whose past medical history and cardiovascular risk factors include: History of nonischemic cardiomyopathy (May 2016 LVEF 30%) paroxysmal atrial fibrillation status post ablation in 01/2016, follow-up echocardiography and noted preserved LVEF at 50-55% as of May 2022.  Hyperlipidemia, hypertension, sleep apnea, obesity.  Patient presents today for 1 year follow-up visit.  Formally was under the care of Dr. [name] and Elvin So, PA.  I am seeing him for the first time.  He has a history of nonischemic cardiomyopathy with a EF of 30% back in 2016.  He underwent atrial fibrillation ablation and repeat echo noted LVEF of 50-55% as of May 2022.  He is currently on medical therapy for paroxysmal atrial fibrillation with Tikosyn, AV nodal blocking agents, and anticoagulation.  He does not endorse evidence of bleeding.  He recently had labs in the prior months with PCP.  He denies anginal chest pain or heart failure symptoms.  FUNCTIONAL STATUS: No structured exercise program or daily routine.   CARDIAC DATABASE: EKG: 05/17/2023: Sinus rhythm, 68 bpm, without underlying ischemia injury pattern.  Echocardiogram: 04/28/2021: Left ventricle cavity is normal in size and wall thickness. Normal global wall motion. Normal LV systolic function with visual EF 50-55%. Normal diastolic filling pattern.  The aortic root is mildly dilated at 3.9 cm.  No evidence of pulmonary hypertension. Proximal ascending aorta not well visualized, noted 4.1 cm in 2020. EF slightly improved from 45-50%. MR not  seen.  Heart Catheterization: Coronary angiogram 07/01/2015: Normal coronary arteries. LVEF 25-35%.    Sleep study 07/17/2015: Successful CPAP titration for OSA. Follows Dr. [name].   AF Ablation 02/05/2016: Successful electrical isolation and anatomical encircling of all four pulmonary veins with radiofrequency current (Dr. Loman Brooklyn)   RADIOLOGY: CT Heart Morp W/Cta Cor W/Score W/Ca W/Cm &/Or Wo/Cm March 2017 1) 4 normal pulmonary veins entering the LA with no anomalies 2) No LAA thrombus 3) No ASD 4) No pericardial effusion 5) Normal LA size 6) Esophagus courses near RLPV ostium Negative over-read of the chest.   ALLERGIES: Allergies  Allergen Reactions   Metoprolol Other (See Comments)    Lethargy, didn't feel like doing anything (tolerates low dose carvedilol)   Valsartan Other (See Comments)    Increased blood pressure   Azithromycin Itching and Other (See Comments)    Tingling in fingers   Entresto [Sacubitril-Valsartan] Rash and Other (See Comments)    Tingling in fingers/ redness & sensitivity at tips of fingers    MEDICATION LIST PRIOR TO VISIT: Current Meds  Medication Sig   acetaminophen (TYLENOL) 500 MG tablet Take 1,000 mg by mouth daily as needed for headache.   carvedilol (COREG) 3.125 MG tablet Take 1 tablet (3.125 mg total) by mouth 2 (two) times daily with a meal.   Cholecalciferol (VITAMIN D) 2000 units tablet Take 2,000 Units by mouth daily.   colchicine 0.6 MG tablet Take 1 tablet by mouth as needed.   dofetilide (TIKOSYN) 500 MCG capsule TAKE 1 CAPSULE EVERY TWELVE HOURS.   eplerenone (INSPRA) 25 MG tablet TAKE 1 TABLET (25 MG TOTAL)  BY MOUTH DAILY.   olmesartan (BENICAR) 40 MG tablet Take 20 mg by mouth at bedtime.   omeprazole (PRILOSEC) 20 MG capsule Take 20 mg by mouth as needed.    XARELTO 20 MG TABS tablet TAKE 1 TABLET BY MOUTH EVERY DAY     PAST MEDICAL HISTORY: Past Medical History:  Diagnosis Date   Arrhythmia    A. Fibrillation S/P  ablation 3/9/201   CHF (congestive heart failure) (HCC)    GERD (gastroesophageal reflux disease)    Gout    HLD (hyperlipidemia)    Hypertension    Obesity    OSA on CPAP    PVC (premature ventricular contraction)    Shortness of breath dyspnea     PAST SURGICAL HISTORY: Past Surgical History:  Procedure Laterality Date   CARDIAC CATHETERIZATION N/A 07/01/2015   Procedure: Left Heart Cath;  Surgeon: Yates Decamp, MD;  Location: [facility] INVASIVE CV LAB;  Service: Cardiovascular;  Laterality: N/A;   CARPAL TUNNEL RELEASE     ELECTROPHYSIOLOGIC STUDY N/A 02/05/2016   Procedure: Atrial Fibrillation Ablation;  Surgeon: Will [name], MD;  Location: [facility] INVASIVE CV LAB;  Service: Cardiovascular;  Laterality: N/A;   NASAL SEPTOPLASTY W/ TURBINOPLASTY Bilateral 09/14/2016   Procedure: NASAL SEPTOPLASTY WITH BILATERAL  TURBINATE REDUCTION;  Surgeon: Newman Pies, MD;  Location: [facility];  Service: ENT;  Laterality: Bilateral;    FAMILY HISTORY: The patient family history includes COPD in his mother; Deep vein thrombosis in his mother; Diabetes in his father; Heart failure in his mother; Hypertension in his father; Lung cancer in his mother; Prostate cancer in his father; Seizures in his mother; Stroke in his paternal grandfather.  SOCIAL HISTORY:  The patient  reports that he has never smoked. He has never used smokeless tobacco. He reports that he does not currently use alcohol after a past usage of about 2.0 standard drinks of alcohol per week. He reports that he does not use drugs.  REVIEW OF SYSTEMS: Review of Systems  Cardiovascular:  Negative for chest pain, claudication, dyspnea on exertion, irregular heartbeat, leg swelling, near-syncope, orthopnea, palpitations, paroxysmal nocturnal dyspnea and syncope.  Respiratory:  Negative for shortness of breath.   Hematologic/Lymphatic: Negative for bleeding problem.  Musculoskeletal:  Negative for muscle cramps and myalgias.   Neurological:  Negative for dizziness and light-headedness.    PHYSICAL EXAM:    05/17/2023    9:30 AM 10/14/2022   10:51 AM 04/30/2022    9:35 AM  Vitals with BMI  Height 5\' 6"  5\' 6"  5\' 6"   Weight 202 lbs 10 oz 205 lbs 216 lbs  BMI 32.72 33.1 34.88  Systolic 121 117 161  Diastolic 82 85 86  Pulse 76 72 70    Physical Exam  Constitutional: No distress.  Age appropriate, hemodynamically stable.   Neck: No JVD present.  Cardiovascular: Normal rate, regular rhythm, S1 normal, S2 normal, intact distal pulses and normal pulses. Exam reveals no gallop, no S3 and no S4.  No murmur heard. Pulmonary/Chest: Effort normal and breath sounds normal. No stridor. He has no wheezes. He has no rales.  Abdominal: Soft. Bowel sounds are normal. He exhibits no distension. There is no abdominal tenderness.  Musculoskeletal:        General: No edema.     Cervical back: Neck supple.  Neurological: He is alert and oriented to person, place, and time. He has intact cranial nerves (2-12).  Skin: Skin is warm and moist.     LABORATORY  DATA:    Latest Ref Rng & Units 01/27/2016    8:40 AM 12/10/2015    2:34 AM 07/04/2015   10:44 PM  CBC  WBC 4.0 - 10.5 K/uL 5.8  8.0  6.9   Hemoglobin 13.0 - 17.0 g/dL 45.4  09.8  11.9   Hematocrit 39.0 - 52.0 % 41.7  43.4  43.9   Platelets 150 - 400 K/uL 182  176  204        Latest Ref Rng & Units 10/08/2021    9:19 AM 01/27/2016    8:40 AM 12/10/2015    2:34 AM  CMP  Glucose 70 - 99 mg/dL 84  91  95   BUN 6 - 24 mg/dL 14  13  12    Creatinine 0.76 - 1.27 mg/dL 1.47  8.29  5.62   Sodium 134 - 144 mmol/L 140  140  140   Potassium 3.5 - 5.2 mmol/L 4.2  4.2  3.6   Chloride 96 - 106 mmol/L 102  104  104   CO2 20 - 29 mmol/L 27  28  29    Calcium 8.7 - 10.2 mg/dL 9.0  8.8  8.8     Lab Results  Component Value Date   CHOL 166 12/08/2015   HDL 37 (L) 12/08/2015   LDLCALC 111 (H) 12/08/2015   TRIG 89 12/08/2015   CHOLHDL 4.5 12/08/2015   No components found  for: "NTPROBNP" No results for input(s): "PROBNP" in the last 8760 hours. No results for input(s): "TSH" in the last 8760 hours.  BMP No results for input(s): "NA", "K", "CL", "CO2", "GLUCOSE", "BUN", "CREATININE", "CALCIUM", "GFRNONAA", "GFRAA" in the last 8760 hours.  HEMOGLOBIN A1C No results found for: "HGBA1C", "MPG"  IMPRESSION:    ICD-10-CM   1. Heart failure with improved ejection fraction (HFimpEF) (HCC)  I50.32 PCV ECHOCARDIOGRAM COMPLETE    2. Hx of Nonischemic cardiomyopathy (HCC)  I42.8 PCV ECHOCARDIOGRAM COMPLETE    3. Paroxysmal atrial fibrillation (HCC)  I48.0 PCV ECHOCARDIOGRAM COMPLETE    4. S/P ablation of atrial fibrillation 3/9/201  Z98.890    Z86.79     5. Aortic dilatation (HCC)  I77.819 CT CHEST WO CONTRAST    6. Essential hypertension  I10 EKG 12-Lead    7. OSA on CPAP  G47.33        RECOMMENDATIONS: Todd Pollok. is a 57 y.o. Caucasian male whose past medical history and cardiac risk factors include: History of nonischemic cardiomyopathy (May 2016 LVEF 30%) paroxysmal atrial fibrillation status post ablation in 01/2016, follow-up echocardiography and noted preserved LVEF at 50-55% as of May 2022.  Hyperlipidemia, hypertension, sleep apnea, obesity.  Heart failure with improved ejection fraction (HFimpEF) (HCC) Hx of Nonischemic cardiomyopathy [facility]) May 2016: LVEF 30%. May 2022: LVEF 50-55% status post atrial fibrillation ablation. Remains stable over the last 1 year. EKG notes sinus rhythm. Clinically denies anginal chest pain or heart failure symptoms. Medications reviewed. Compliant with daily sleep apnea therapy. Recommend follow-up echo in 2025 is a 3-year follow-up study given his history.  Paroxysmal atrial fibrillation (HCC) S/P ablation of atrial fibrillation 3/9/201 Rate control: Carvedilol. Rhythm control: Tikosyn. Thromboembolic prophylaxis: Xarelto. Click Here to Calculate/Change CHADS2VASc Score The patient's CHADS2-VASc  score is 2, indicating a 2.2% annual risk of stroke.   CHF History: Yes HTN History: Yes Diabetes History: No Stroke History: No Vascular Disease History: No Risks, benefits, and alternatives to anticoagulation discussed. Patient states that he recently had labs at PCP.  No  records available in Care Everywhere.  Will request records.  Would like to make sure his hemoglobin is stable and renal function is acceptable for the current dose of Xarelto.  Aortic dilatation (HCC) Proximal ascending aorta on echocardiography in 2020 was reported to be 41 mm. Proximal ascending aorta on echo in May 2022 was not well-visualized.  Radiology studies reviewed, based on current diagnostic testing no prior CT of the chest is available for review.  Given his complex cardiovascular history and hypertension and concern for possible aortic dilatation recommended CT of the chest without contrast to either verify or refute the echocardiography findings.  Patient appears to be apprehensive to have this done as this was not addressed during his prior visits.  Patient states that the CT scan may be cost prohibitive on his current insurance plan.  He would like to discuss this further with Dr. [name] (who he consider as his primary cardiologist). He is scheduled for an echo prior to the next office visit which could be an alternative with regards to evaluating his aortic root and proximal ascending aorta. Re emphasized the importance of blood pressure management.  Essential hypertension Office blood pressures are well-controlled. No changes warranted at this time.   FINAL MEDICATION LIST END OF ENCOUNTER: No orders of the defined types were placed in this encounter.   There are no discontinued medications.   Current Outpatient Medications:    acetaminophen (TYLENOL) 500 MG tablet, Take 1,000 mg by mouth daily as needed for headache., Disp: , Rfl:    carvedilol (COREG) 3.125 MG tablet, Take 1 tablet (3.125 mg total)  by mouth 2 (two) times daily with a meal., Disp: 180 tablet, Rfl: 3   Cholecalciferol (VITAMIN D) 2000 units tablet, Take 2,000 Units by mouth daily., Disp: , Rfl:    colchicine 0.6 MG tablet, Take 1 tablet by mouth as needed., Disp: , Rfl:    dofetilide (TIKOSYN) 500 MCG capsule, TAKE 1 CAPSULE EVERY TWELVE HOURS., Disp: 180 capsule, Rfl: 3   eplerenone (INSPRA) 25 MG tablet, TAKE 1 TABLET (25 MG TOTAL) BY MOUTH DAILY., Disp: 90 tablet, Rfl: 0   olmesartan (BENICAR) 40 MG tablet, Take 20 mg by mouth at bedtime., Disp: , Rfl:    omeprazole (PRILOSEC) 20 MG capsule, Take 20 mg by mouth as needed. , Disp: , Rfl:    XARELTO 20 MG TABS tablet, TAKE 1 TABLET BY MOUTH EVERY DAY, Disp: 90 tablet, Rfl: 1  Orders Placed This Encounter  Procedures   CT CHEST WO CONTRAST   EKG 12-Lead   PCV ECHOCARDIOGRAM COMPLETE    There are no Patient Instructions on file for this visit.   --Continue cardiac medications as reconciled in final medication list. --Return in about 1 year (around 05/16/2024) for Annual follow up visit hx of cardiomyopathy, PAfib. . or sooner if needed. --Continue follow-up with your primary care physician regarding the management of your other chronic comorbid conditions.  Patient's questions and concerns were addressed to his satisfaction. He voices understanding of the instructions provided during this encounter.   This note was created using a voice recognition software as a result there may be grammatical errors inadvertently enclosed that do not reflect the nature of this encounter. Every attempt is made to correct such errors.  Tessa Lerner, [place], [facility]  Pager:  [phone] Office: [phone]

## 2023-05-25 ENCOUNTER — Telehealth: Payer: Self-pay

## 2023-05-25 DIAGNOSIS — I77819 Aortic ectasia, unspecified site: Secondary | ICD-10-CM

## 2023-05-25 DIAGNOSIS — I5032 Chronic diastolic (congestive) heart failure: Secondary | ICD-10-CM

## 2023-05-25 NOTE — Telephone Encounter (Signed)
Patient wants to know if you feel that having the CT CHEST WO CONTRAST  is necessary. Because this test with his insurance will cost 500 dollars.

## 2023-05-25 NOTE — Telephone Encounter (Signed)
We can wait till we get the echocardiogram and then decide.  We could also try to get coronary calcium score for about $100 which might give US aortic root dimension although not accurate.

## 2023-05-25 NOTE — Telephone Encounter (Signed)
I understand the cost can be rate limiting.  That's the reason I had ordered the echo as an alternative before the next visit.  Hope that helps.  At the last visit he wanted to follow w/ Dr. [name] - please confirm with patient and change the appt if needed.   Hanson Medeiros Warden, DO, [facility]

## 2023-05-30 NOTE — Telephone Encounter (Signed)
Pt switching back to JG. Also wants to know if ok to wait til next year after Echo or does it need to be moved up.

## 2023-05-31 NOTE — Telephone Encounter (Signed)
Patient would like for you to order him a Calcium score.

## 2023-05-31 NOTE — Telephone Encounter (Signed)
You have had cardiac catheterization revealing no significant coronary disease.  You need primary prevention.  Coronary calcium score will only change if you need to be on a cholesterol medicine or not.  I do not have your recent cholesterol panel, please forwarded to [place] and he can upload it on MyChart.  Coronary calcium score will not aid as much in your case.

## 2023-06-01 NOTE — Telephone Encounter (Signed)
Todd [name],   I believe he is asking for Cal Score to look at the Aorta dimension - like you mentioned in the prior messages.   ST

## 2023-06-02 ENCOUNTER — Encounter: Payer: Self-pay | Admitting: Cardiology

## 2023-06-02 NOTE — Telephone Encounter (Signed)
ICD-10-CM   1. Aortic dilatation (HCC)  I77.819 CT CARDIAC SCORING (SELF PAY ONLY)    2. Heart failure with improved ejection fraction (HFimpEF) (HCC)  I50.32 CT CARDIAC SCORING (SELF PAY ONLY)     Orders Placed This Encounter  Procedures   CT CARDIAC SCORING (SELF PAY ONLY)    Standing Status:   Future    Standing Expiration Date:   08/03/2023    Order Specific Question:   Preferred imaging location?    Answer:   External    Order Specific Question:   Radiology Contrast Protocol - do NOT remove file path    Answer:   [email or web address]    No orders of the defined types were placed in this encounter.

## 2023-06-08 ENCOUNTER — Other Ambulatory Visit: Payer: Self-pay | Admitting: Cardiology

## 2023-06-14 DIAGNOSIS — G4733 Obstructive sleep apnea (adult) (pediatric): Secondary | ICD-10-CM | POA: Diagnosis not present

## 2023-06-15 ENCOUNTER — Other Ambulatory Visit: Payer: Self-pay | Admitting: Internal Medicine

## 2023-06-15 DIAGNOSIS — I480 Paroxysmal atrial fibrillation: Secondary | ICD-10-CM

## 2023-06-16 ENCOUNTER — Other Ambulatory Visit (HOSPITAL_COMMUNITY): Payer: BC Managed Care – PPO

## 2023-06-22 DIAGNOSIS — G4733 Obstructive sleep apnea (adult) (pediatric): Secondary | ICD-10-CM | POA: Diagnosis not present

## 2023-06-23 ENCOUNTER — Other Ambulatory Visit: Payer: Self-pay | Admitting: Cardiology

## 2023-06-23 DIAGNOSIS — G4733 Obstructive sleep apnea (adult) (pediatric): Secondary | ICD-10-CM | POA: Diagnosis not present

## 2023-06-23 DIAGNOSIS — I480 Paroxysmal atrial fibrillation: Secondary | ICD-10-CM

## 2023-06-23 NOTE — Telephone Encounter (Signed)
Yes ST

## 2023-06-23 NOTE — Telephone Encounter (Signed)
Please refill.

## 2023-06-28 ENCOUNTER — Telehealth: Payer: Self-pay

## 2023-06-28 NOTE — Telephone Encounter (Signed)
Patient called asking for a refill on his dofetilide. Patient mention he only has a few days left

## 2023-07-13 ENCOUNTER — Ambulatory Visit (HOSPITAL_COMMUNITY): Payer: BC Managed Care – PPO

## 2023-08-03 ENCOUNTER — Ambulatory Visit (HOSPITAL_COMMUNITY)
Admission: RE | Admit: 2023-08-03 | Discharge: 2023-08-03 | Disposition: A | Discharge status: Home / Self Care | Payer: BC Managed Care – PPO | Source: Ambulatory Visit | Attending: Cardiology | Admitting: Cardiology

## 2023-08-03 DIAGNOSIS — I5032 Chronic diastolic (congestive) heart failure: Secondary | ICD-10-CM | POA: Insufficient documentation

## 2023-08-03 DIAGNOSIS — I77819 Aortic ectasia, unspecified site: Secondary | ICD-10-CM | POA: Insufficient documentation

## 2023-08-05 NOTE — Progress Notes (Signed)
Coronary calcium score 0 Total Agatston coronary calcium score 0. MESA database percentile 0-1.  Visualized ascending and descending aorta Normal.

## 2023-08-11 NOTE — Progress Notes (Signed)
Tried calling pt no answer left vm

## 2023-08-11 NOTE — Progress Notes (Signed)
Coronary calcium score is zero and suggests low risk for coronary artery disease

## 2023-08-12 NOTE — Progress Notes (Signed)
Pt aware.

## 2023-09-11 ENCOUNTER — Other Ambulatory Visit: Payer: Self-pay | Admitting: Cardiology

## 2023-09-11 DIAGNOSIS — I480 Paroxysmal atrial fibrillation: Secondary | ICD-10-CM

## 2023-10-17 ENCOUNTER — Encounter: Payer: Self-pay | Admitting: *Deleted

## 2023-10-18 ENCOUNTER — Telehealth: Payer: BC Managed Care – PPO | Admitting: Adult Health

## 2023-12-09 ENCOUNTER — Other Ambulatory Visit: Payer: Self-pay | Admitting: Cardiology

## 2023-12-09 DIAGNOSIS — I480 Paroxysmal atrial fibrillation: Secondary | ICD-10-CM

## 2023-12-10 ENCOUNTER — Other Ambulatory Visit: Payer: Self-pay | Admitting: Cardiology

## 2023-12-10 DIAGNOSIS — I480 Paroxysmal atrial fibrillation: Secondary | ICD-10-CM

## 2023-12-12 NOTE — Telephone Encounter (Signed)
 Prescription refill request for Xarelto received.  Indication:afib Last office visit:6/24 Weight:91.9  kg Age:58 WUJ:WJXBJ labs CrCl:needs labs  Prescription refilled

## 2023-12-14 ENCOUNTER — Telehealth: Payer: Self-pay | Admitting: Cardiology

## 2023-12-14 DIAGNOSIS — I480 Paroxysmal atrial fibrillation: Secondary | ICD-10-CM

## 2023-12-14 MED ORDER — RIVAROXABAN 20 MG PO TABS
20.0000 mg | ORAL_TABLET | Freq: Every day | ORAL | 0 refills | Status: DC
Start: 2023-12-14 — End: 2024-02-03

## 2023-12-14 NOTE — Telephone Encounter (Signed)
*  STAT* If patient is at the pharmacy, call can be transferred to refill team.   1. Which medications need to be refilled? (please list name of each medication and dose if known)   rivaroxaban  (XARELTO ) 20 MG TABS tablet   2. Would you like to learn more about the convenience, safety, & potential cost savings by using the [facility]?   3. Are you open to using the [facility] (Type [facility]. ).  4. Which pharmacy/location (including street and city if local pharmacy) is medication to be sent to?  [id number] - [place], [place] - [address]   5. Do they need a 30 day or 90 day supply?   90 day  Patient stated his prescription was called in for 30 days and it needs to be 90 days for his insurance.  Patient stated he still has some medication left.

## 2023-12-14 NOTE — Telephone Encounter (Signed)
 Pt states he has labs scheduled with PCP on 12/21/23. Pt understands these labs must be completed for future refills.

## 2023-12-14 NOTE — Telephone Encounter (Signed)
 Prescription refill request for Xarelto  received.  Indication: Afib  Last office visit: 05/17/23 Albert Huff)  Weight: 91.9kg Age: 58 Scr: 1.02 (10/08/21)  CrCl:   Labs overdue. Called PCP and requested labs to be faxed to anticoagulation clinic.

## 2023-12-14 NOTE — Telephone Encounter (Signed)
 PCP returned called and states last labs were drawn on 12/01/22. Called pt to make him aware of overdue labs.

## 2023-12-20 ENCOUNTER — Telehealth: Payer: Self-pay | Admitting: Adult Health

## 2023-12-20 ENCOUNTER — Encounter: Payer: Self-pay | Admitting: Adult Health

## 2023-12-20 NOTE — Telephone Encounter (Signed)
LVM and sent letter in mail informing pt of appt time change on 03/01/24 - NP schedule change

## 2023-12-21 DIAGNOSIS — Z125 Encounter for screening for malignant neoplasm of prostate: Secondary | ICD-10-CM | POA: Diagnosis not present

## 2023-12-21 DIAGNOSIS — Z1212 Encounter for screening for malignant neoplasm of rectum: Secondary | ICD-10-CM | POA: Diagnosis not present

## 2023-12-21 DIAGNOSIS — E785 Hyperlipidemia, unspecified: Secondary | ICD-10-CM | POA: Diagnosis not present

## 2023-12-21 DIAGNOSIS — E559 Vitamin D deficiency, unspecified: Secondary | ICD-10-CM | POA: Diagnosis not present

## 2023-12-21 DIAGNOSIS — R7301 Impaired fasting glucose: Secondary | ICD-10-CM | POA: Diagnosis not present

## 2023-12-28 DIAGNOSIS — R82998 Other abnormal findings in urine: Secondary | ICD-10-CM | POA: Diagnosis not present

## 2023-12-28 DIAGNOSIS — Z1339 Encounter for screening examination for other mental health and behavioral disorders: Secondary | ICD-10-CM | POA: Diagnosis not present

## 2023-12-28 DIAGNOSIS — I10 Essential (primary) hypertension: Secondary | ICD-10-CM | POA: Diagnosis not present

## 2023-12-28 DIAGNOSIS — Z23 Encounter for immunization: Secondary | ICD-10-CM | POA: Diagnosis not present

## 2023-12-28 DIAGNOSIS — I110 Hypertensive heart disease with heart failure: Secondary | ICD-10-CM | POA: Diagnosis not present

## 2023-12-28 DIAGNOSIS — Z0000 Encounter for general adult medical examination without abnormal findings: Secondary | ICD-10-CM | POA: Diagnosis not present

## 2023-12-28 DIAGNOSIS — Z1331 Encounter for screening for depression: Secondary | ICD-10-CM | POA: Diagnosis not present

## 2023-12-28 LAB — LAB REPORT - SCANNED
A1c: 5.3
Albumin, Urine POC: <3
Creatinine, POC: 105 mg/dL
Microalb Creat Ratio: <3

## 2024-01-04 DIAGNOSIS — H5213 Myopia, bilateral: Secondary | ICD-10-CM | POA: Diagnosis not present

## 2024-01-04 DIAGNOSIS — H04123 Dry eye syndrome of bilateral lacrimal glands: Secondary | ICD-10-CM | POA: Diagnosis not present

## 2024-01-27 ENCOUNTER — Telehealth: Payer: Self-pay | Admitting: Cardiology

## 2024-01-27 NOTE — Telephone Encounter (Signed)
 Pt c/o medication issue:  1. Name of Medication: Tikosyn  2. How are you currently taking this medication (dosage and times per day)? 2 a day  3. Are you having a reaction (difficulty breathing--STAT)?   4. What is your medication issue? Patient said he miss taking a couple of doses. He wants to know if this will be a problem?

## 2024-01-30 NOTE — Telephone Encounter (Signed)
 Pt calling back to f/u on call from Friday. Please advise

## 2024-01-30 NOTE — Telephone Encounter (Signed)
 I spoke with patient.  He recently changed shifts at work.  Was very tired and missed taking Tikosyn for one day.  This was on 2/23.  He has since resumed and not missed any more doses.

## 2024-01-30 NOTE — Telephone Encounter (Signed)
 Reviewed with Dr [name] and OK for patient to continue Tikosyn.  No changes needed.  Patient notified

## 2024-02-03 ENCOUNTER — Other Ambulatory Visit: Payer: Self-pay | Admitting: Cardiology

## 2024-02-03 DIAGNOSIS — I480 Paroxysmal atrial fibrillation: Secondary | ICD-10-CM

## 2024-02-03 NOTE — Telephone Encounter (Signed)
 Prescription refill request for Xarelto received.  Indication:afib Last office visit:6/24 Weight:91.9  kg Age:58 WUJ:WJXBJ labs CrCl:needs labs  Prescription refilled

## 2024-03-01 ENCOUNTER — Ambulatory Visit: Payer: BC Managed Care – PPO | Admitting: Adult Health

## 2024-04-24 ENCOUNTER — Telehealth: Payer: Self-pay | Admitting: Adult Health

## 2024-04-24 ENCOUNTER — Encounter: Payer: Self-pay | Admitting: Adult Health

## 2024-04-24 NOTE — Telephone Encounter (Signed)
 LVM and sent letter in mail informing pt of need to reschedule 10/17/24 appt - NP schedule change

## 2024-05-04 ENCOUNTER — Telehealth: Payer: Self-pay | Admitting: Cardiology

## 2024-05-04 DIAGNOSIS — I480 Paroxysmal atrial fibrillation: Secondary | ICD-10-CM

## 2024-05-04 MED ORDER — RIVAROXABAN 20 MG PO TABS: 20.0000 mg | ORAL_TABLET | Freq: Every day | ORAL | 1 refills | Status: DC

## 2024-05-04 NOTE — Telephone Encounter (Signed)
 Labs are in Care Everywhere from Dr. Marcheta Seta office from 12/21/23. Called pt to update him and had to leave a message regarding this.   Xarelto  20mg  refill request received. Pt is 58 years old, weight-91.9kg, Crea-0.90 on 12/21/23 via Care Everywhere from Dr. Bernetta Brilliant, last seen by [facility] on 05/17/23, Diagnosis-Afib, CrCl- 117.71 mL/min; Dose is appropriate based on dosing criteria. Will send in refill to requested pharmacy.

## 2024-05-04 NOTE — Telephone Encounter (Signed)
 Per chart message pt needs labs for his    rivaroxaban  (XARELTO ) 20 MG TABS tablet  Pt states he had labs done at his PCP in Feb 2025 and they were suppose to send them here. I told pt we do not have them so he is going to call PCP and have them faxed over. He wants to know when we get them can we change script to a 90 day supply instead of a 30 day supply?

## 2024-05-08 ENCOUNTER — Other Ambulatory Visit: Payer: BC Managed Care – PPO

## 2024-05-08 ENCOUNTER — Ambulatory Visit (HOSPITAL_COMMUNITY): Payer: BC Managed Care – PPO

## 2024-05-15 ENCOUNTER — Ambulatory Visit (HOSPITAL_COMMUNITY)
Admission: RE | Admit: 2024-05-15 | Discharge: 2024-05-15 | Disposition: A | Discharge status: Home / Self Care | Source: Ambulatory Visit | Attending: Cardiovascular Disease | Admitting: Cardiovascular Disease

## 2024-05-15 DIAGNOSIS — I480 Paroxysmal atrial fibrillation: Secondary | ICD-10-CM | POA: Diagnosis not present

## 2024-05-15 DIAGNOSIS — I5032 Chronic diastolic (congestive) heart failure: Secondary | ICD-10-CM

## 2024-05-15 DIAGNOSIS — I428 Other cardiomyopathies: Secondary | ICD-10-CM

## 2024-05-15 LAB — ECHOCARDIOGRAM COMPLETE: S' Lateral: 5.13 cm

## 2024-05-16 ENCOUNTER — Encounter (HOSPITAL_BASED_OUTPATIENT_CLINIC_OR_DEPARTMENT_OTHER): Payer: Self-pay

## 2024-05-17 ENCOUNTER — Ambulatory Visit: Payer: BC Managed Care – PPO | Admitting: Cardiology

## 2024-05-18 ENCOUNTER — Ambulatory Visit: Payer: BC Managed Care – PPO | Attending: Cardiology | Admitting: Cardiology

## 2024-05-18 ENCOUNTER — Encounter: Payer: Self-pay | Admitting: Cardiology

## 2024-05-18 VITALS — BP 119/77 | HR 80 | Resp 16 | Ht 66.0 in | Wt 203.8 lb

## 2024-05-18 DIAGNOSIS — I428 Other cardiomyopathies: Secondary | ICD-10-CM

## 2024-05-18 DIAGNOSIS — I10 Essential (primary) hypertension: Secondary | ICD-10-CM

## 2024-05-18 DIAGNOSIS — I480 Paroxysmal atrial fibrillation: Secondary | ICD-10-CM

## 2024-05-18 NOTE — Patient Instructions (Signed)
 Medication Instructions:  Your physician recommends that you continue on your current medications as directed. Please refer to the Current Medication list given to you today.  *If you need a refill on your cardiac medications before your next appointment, please call your pharmacy*  Lab Work: none If you have labs (blood work) drawn today and your tests are completely normal, you will receive your results only by: MyChart Message (if you have MyChart) OR A paper copy in the mail If you have any lab test that is abnormal or we need to change your treatment, we will call you to review the results.  Testing/Procedures: none  Follow-Up: At [facility], you and your health needs are our priority.  As part of our continuing mission to provide you with exceptional heart care, our providers are all part of one team.  This team includes your primary Cardiologist (physician) and Advanced Practice Providers or APPs (Physician Assistants and Nurse Practitioners) who all work together to provide you with the care you need, when you need it.  Your next appointment:   As needed  Provider:   Knox Perl, MD    We recommend signing up for the patient portal called MyChart.  Sign up information is provided on this After Visit Summary.  MyChart is used to connect with patients for Virtual Visits (Telemedicine).  Patients are able to view lab/test results, encounter notes, upcoming appointments, etc.  Non-urgent messages can be sent to your provider as well.   To learn more about what you can do with MyChart, go to [email or web address].   Other Instructions Please schedule patient for follow up appointment with Dr Lawana Pray

## 2024-05-18 NOTE — Progress Notes (Unsigned)
  Cardiology Office Note:  .   Date:  05/18/2024  ID:  [name], DOB 1966-09-25, MRN [id number] PCP: Todd Milroy., MD  [facility] Providers Cardiologist:  Knox Perl, MD Electrophysiologist:  Will Cortland Ding, MD { Click to update primary MD,subspecialty MD or APP then REFRESH:1}  History of Present Illness: .   Todd Schueller. is a 58 y.o. Caucasian male whose past medical history and cardiovascular risk factors include: History of nonischemic cardiomyopathy (May 2016 LVEF 30%) paroxysmal atrial fibrillation status post ablation in 01/2016, follow-up echocardiography and noted preserved LVEF at 50-55% as of May 2022.  Hyperlipidemia, hypertension, sleep apnea, obesity.   Discussed the use of AI scribe software for clinical note transcription with the patient, who gave verbal consent to proceed.  History of Present Illness   Labs     External Labs:  ***  ROS  ***ROS  Physical Exam:   VS:  BP 119/77 (BP Location: Left Arm, Patient Position: Sitting, Cuff Size: Large)   Pulse 80   Resp 16   Ht 5' 6 (1.676 m)   Wt 203 lb 12.8 oz (92.4 kg)   SpO2 97%   BMI 32.89 kg/m    Wt Readings from Last 3 Encounters:  05/18/24 203 lb 12.8 oz (92.4 kg)  05/17/23 202 lb 9.6 oz (91.9 kg)  10/14/22 205 lb (93 kg)    ***Physical Exam Studies Reviewed: Todd [name]    ECHOCARDIOGRAM COMPLETE 05/15/2024  1. Left ventricular ejection fraction, by estimation, is 40 to 45%. Left ventricular ejection fraction by 3D volume is 53 %. The left ventricle has mildly decreased function. The left ventricle demonstrates global hypokinesis. Left ventricular diastolic parameters are consistent with Grade II diastolic dysfunction (pseudonormalization). 2. Right ventricular systolic function is mildly reduced. The right ventricular size is normal. 3. The mitral valve is normal in structure. Trivial mitral valve regurgitation. No evidence of mitral stenosis. 4. The aortic valve is  normal in structure. Aortic valve regurgitation is trivial. No aortic stenosis is present. 5. Aortic dilatation noted. There is mild dilatation of the aortic root, measuring 41 mm. There is mild dilatation of the ascending aorta, measuring 41 mm. 6. The inferior vena cava is normal in size with greater than 50% respiratory variability, suggesting right atrial pressure of 3 mmHg.  EKG:    EKG Interpretation Date/Time:  Friday May 18 2024 15:39:06 EDT Ventricular Rate:  83 PR Interval:  180 QRS Duration:  104 QT Interval:  412 QTC Calculation: 484 R Axis:   -28  Text Interpretation: EKG 05/18/2024: Normal sinus rhythm at the rate of 83 bpm, leftward axis, incomplete right bundle branch block.  No evidence of ischemia.  No significant change from 02/06/2016. Confirmed by Consuelo Thayne, Jagadeesh ([id number]) on 05/18/2024 4:19:14 PM    Medications ordered    No orders of the defined types were placed in this encounter.    ASSESSMENT AND PLAN: .      ICD-10-CM   1. Paroxysmal atrial fibrillation (HCC)  I48.0 EKG 12-Lead    2. Nonischemic cardiomyopathy (HCC)  I42.8     3. Essential hypertension  I10       Assessment and Plan Assessment & Plan      Signed,  Knox Perl, MD, [facility] 05/18/2024, 4:32 PM [facility] [address] [place], [place] [address] Phone: [phone]. Fax:  [phone]

## 2024-05-19 ENCOUNTER — Ambulatory Visit: Payer: Self-pay | Admitting: Cardiology

## 2024-05-19 NOTE — Progress Notes (Signed)
 Thank you Sunit.

## 2024-05-29 ENCOUNTER — Other Ambulatory Visit: Payer: Self-pay | Admitting: Cardiology

## 2024-05-29 DIAGNOSIS — I480 Paroxysmal atrial fibrillation: Secondary | ICD-10-CM

## 2024-06-17 ENCOUNTER — Other Ambulatory Visit: Payer: Self-pay | Admitting: Cardiology

## 2024-06-17 DIAGNOSIS — I480 Paroxysmal atrial fibrillation: Secondary | ICD-10-CM

## 2024-08-13 NOTE — Progress Notes (Unsigned)
  Electrophysiology Office Note:   Date:  08/14/2024  ID:  Todd [name]., DOB [birth year], MRN [id number]  Primary Cardiologist: Todd Bergamo, MD Primary Heart Failure: None Electrophysiologist: Gwyn Hieronymus Gladis Norton, MD      History of Present Illness:   Todd [name]. is a 58 y.o. male with h/o nonischemic cardiomyopathy, ejection fraction 30%, atrial fibrillation post ablation in 2017 with an improvement in ejection fraction, hyperlipidemia, hypertension, sleep apnea, obesity seen today for  for Electrophysiology evaluation of atrial fibrillation at the request of Todd [name].    Over the last few years he has done well.  He has tolerated his dofetilide  without any issue.  He has had only minimal palpitations.  He does feel somewhat fatigued and weak, but otherwise has no acute complaints at this time.  He is happy with his control.  Review of systems complete and found to be negative unless listed in HPI.   EP Information / Studies Reviewed:    EKG is ordered today. Personal review as below.  EKG Interpretation Date/Time:  Tuesday August 14 2024 09:22:18 EDT Ventricular Rate:  76 PR Interval:  188 QRS Duration:  104 QT Interval:  432 QTC Calculation: 486 R Axis:   -6  Text Interpretation: Sinus rhythm with occasional Premature ventricular complexes Prolonged QT When compared with ECG of 18-May-2024 15:39, Premature ventricular complexes are now Present Confirmed by Zorian Gunderman ([id number]) on 08/14/2024 9:23:48 AM     Risk Assessment/Calculations:    CHA2DS2-VASc Score = 2   This indicates a 2.2% annual risk of stroke. The patient's score is based upon: CHF History: 1 HTN History: 1 Diabetes History: 0 Stroke History: 0 Vascular Disease History: 0 Age Score: 0 Gender Score: 0            Physical Exam:   VS:  BP 116/78 (BP Location: Right Arm, Patient Position: Sitting, Cuff Size: Large)   Pulse 76   Ht 5' 6 (1.676 m)   Wt 205 lb (93 kg)   SpO2 98%   BMI  33.09 kg/m    Wt Readings from Last 3 Encounters:  08/14/24 205 lb (93 kg)  05/18/24 203 lb 12.8 oz (92.4 kg)  05/17/23 202 lb 9.6 oz (91.9 kg)     GEN: Well nourished, well developed in no acute distress NECK: No JVD; No carotid bruits CARDIAC: Regular rate and rhythm, no murmurs, rubs, gallops RESPIRATORY:  Clear to auscultation without rales, wheezing or rhonchi  ABDOMEN: Soft, non-tender, non-distended EXTREMITIES:  No edema; No deformity   ASSESSMENT AND PLAN:    1.  Paroxysmal atrial fibrillation: Post ablation in 2017.  On dofetilide .  He has had minimal episodes of atrial fibrillation over the last few years.  Happy with his dofetilide .  He does have PVCs on his EKG today but they are not closely coupled.  QTc is remained stable.  [name] continue with current management.  2.  Secondary hypercoagulable state: On Xarelto   3.  High risk medication monitoring: On dofetilide .  QTc remained stable.  [name] check a BMP, magnesium today.  4.  Chronic systolic heart failure: Due to nonischemic cardiomyopathy.  Has had significant improvement in ejection fraction.  No current shortness of breath or symptoms.  Continue with management per primary cardiology.  5.  Hypertension: Well-controlled  6.  Obstructive sleep apnea: CPAP compliance encouraged  Follow up with [facility] in 12 months  Signed, Menelik Mcfarren Gladis Norton, MD

## 2024-08-14 ENCOUNTER — Ambulatory Visit: Attending: Cardiology | Admitting: Cardiology

## 2024-08-14 ENCOUNTER — Encounter: Payer: Self-pay | Admitting: Cardiology

## 2024-08-14 VITALS — BP 116/78 | HR 76 | Ht 66.0 in | Wt 205.0 lb

## 2024-08-14 DIAGNOSIS — I10 Essential (primary) hypertension: Secondary | ICD-10-CM

## 2024-08-14 DIAGNOSIS — I428 Other cardiomyopathies: Secondary | ICD-10-CM | POA: Diagnosis not present

## 2024-08-14 DIAGNOSIS — Z79899 Other long term (current) drug therapy: Secondary | ICD-10-CM

## 2024-08-14 DIAGNOSIS — I480 Paroxysmal atrial fibrillation: Secondary | ICD-10-CM | POA: Diagnosis not present

## 2024-08-14 DIAGNOSIS — D6869 Other thrombophilia: Secondary | ICD-10-CM

## 2024-08-14 DIAGNOSIS — I5032 Chronic diastolic (congestive) heart failure: Secondary | ICD-10-CM

## 2024-08-14 NOTE — Patient Instructions (Addendum)
 Medication Instructions:  Your physician recommends that you continue on your current medications as directed. Please refer to the Current Medication list given to you today.  *If you need a refill on your cardiac medications before your next appointment, please call your pharmacy*  Lab Work: Today, Tikosyn  surveillance lab work:  BMET &  Magnesium level  If you have any lab test that is abnormal or we need to change your treatment, we will call you to review the results.  Testing/Procedures: None ordered  Follow-Up: At [facility], you and your health needs are our priority.  As part of our continuing mission to provide you with exceptional heart care, our providers are all part of one team.  This team includes your primary Cardiologist (physician) and Advanced Practice Providers or APPs (Physician Assistants and Nurse Practitioners) who all work together to provide you with the care you need, when you need it.  Your next appointment:   1 year(s)  Provider:   You will follow up in the Atrial Fibrillation Clinic located at [facility]. Your provider will be: Clint R. Fenton, PA-C or Fairy Heinrich, PA-C    Thank you for choosing [employer]!!   Maeola Domino, RN [phone]

## 2024-09-14 DIAGNOSIS — Z79899 Other long term (current) drug therapy: Secondary | ICD-10-CM | POA: Diagnosis not present

## 2024-09-14 DIAGNOSIS — I480 Paroxysmal atrial fibrillation: Secondary | ICD-10-CM | POA: Diagnosis not present

## 2024-09-14 LAB — BASIC METABOLIC PANEL WITH GFR
BUN/Creatinine Ratio: 18 (ref 9–20)
BUN: 18 mg/dL (ref 6–24)
CO2: 26 mmol/L (ref 20–29)
Calcium: 9.3 mg/dL (ref 8.7–10.2)
Chloride: 101 mmol/L (ref 96–106)
Creatinine, Ser: 1 mg/dL (ref 0.76–1.27)
Glucose: 97 mg/dL (ref 70–99)
Potassium: 4.3 mmol/L (ref 3.5–5.2)
Sodium: 141 mmol/L (ref 134–144)
eGFR: 88 mL/min/1.73 (ref >59)

## 2024-09-14 LAB — MAGNESIUM: Magnesium: 2.2 mg/dL (ref 1.6–2.3)

## 2024-10-11 ENCOUNTER — Encounter: Payer: Self-pay | Admitting: *Deleted

## 2024-10-15 ENCOUNTER — Ambulatory Visit: Admitting: Adult Health

## 2024-10-15 ENCOUNTER — Telehealth: Payer: Self-pay | Admitting: Adult Health

## 2024-10-15 NOTE — Telephone Encounter (Signed)
 Pt called to cancel appt Pt has a sore throat  Appt rescheduled

## 2024-10-17 ENCOUNTER — Ambulatory Visit: Admitting: Adult Health

## 2024-11-17 ENCOUNTER — Other Ambulatory Visit: Payer: Self-pay | Admitting: Cardiology

## 2024-11-17 DIAGNOSIS — I480 Paroxysmal atrial fibrillation: Secondary | ICD-10-CM

## 2024-11-19 NOTE — Telephone Encounter (Signed)
 Prescription refill request for Xarelto  received.  Indication: PAF Last office visit: 08/14/24  LELON Norton MD Weight: 93kg Age: 58 Scr: 1.00 on 09/14/24  Epic CrCl: 105.92  Based on above findings Xarelto  20mg  daily is the appropriate dose.  Refill approved.

## 2025-02-12 ENCOUNTER — Ambulatory Visit: Admitting: Neurology
